# Patient Record
Sex: Female | Born: 1960 | Race: White | Hispanic: No | Smoking: Former smoker
Health system: Southern US, Community
[De-identification: ages and names within clinical notes are randomized; demographics above are authoritative.]

## PROBLEM LIST (undated history)

## (undated) DIAGNOSIS — T1491XA Suicide attempt, initial encounter: Secondary | ICD-10-CM

## (undated) DIAGNOSIS — I10 Essential (primary) hypertension: Secondary | ICD-10-CM

## (undated) DIAGNOSIS — E119 Type 2 diabetes mellitus without complications: Secondary | ICD-10-CM

## (undated) DIAGNOSIS — F419 Anxiety disorder, unspecified: Secondary | ICD-10-CM

## (undated) DIAGNOSIS — E669 Obesity, unspecified: Secondary | ICD-10-CM

## (undated) DIAGNOSIS — R1110 Vomiting, unspecified: Secondary | ICD-10-CM

## (undated) DIAGNOSIS — F1910 Other psychoactive substance abuse, uncomplicated: Secondary | ICD-10-CM

## (undated) DIAGNOSIS — F319 Bipolar disorder, unspecified: Secondary | ICD-10-CM

## (undated) DIAGNOSIS — E785 Hyperlipidemia, unspecified: Secondary | ICD-10-CM

## (undated) DIAGNOSIS — F32A Depression, unspecified: Secondary | ICD-10-CM

## (undated) DIAGNOSIS — F329 Major depressive disorder, single episode, unspecified: Secondary | ICD-10-CM

## (undated) HISTORY — DX: Hyperlipidemia, unspecified: E78.5

## (undated) HISTORY — DX: Anxiety disorder, unspecified: F41.9

## (undated) HISTORY — DX: Vomiting, unspecified: R11.10

## (undated) HISTORY — DX: Other psychoactive substance abuse, uncomplicated: F19.10

## (undated) HISTORY — DX: Type 2 diabetes mellitus without complications: E11.9

## (undated) HISTORY — PX: TONSILLECTOMY: SUR1361

## (undated) HISTORY — DX: Depression, unspecified: F32.A

## (undated) HISTORY — DX: Major depressive disorder, single episode, unspecified: F32.9

## (undated) HISTORY — DX: Bipolar disorder, unspecified: F31.9

## (undated) HISTORY — DX: Essential (primary) hypertension: I10

## (undated) HISTORY — DX: Obesity, unspecified: E66.9

---

## 1997-08-14 ENCOUNTER — Inpatient Hospital Stay (HOSPITAL_COMMUNITY): Admission: EM | Admit: 1997-08-14 | Discharge: 1997-08-15 | Payer: Self-pay | Admitting: Emergency Medicine

## 1997-08-22 ENCOUNTER — Emergency Department (HOSPITAL_COMMUNITY): Admission: EM | Admit: 1997-08-22 | Discharge: 1997-08-22 | Payer: Self-pay | Admitting: Emergency Medicine

## 1997-11-03 ENCOUNTER — Emergency Department (HOSPITAL_COMMUNITY): Admission: EM | Admit: 1997-11-03 | Discharge: 1997-11-03 | Payer: Self-pay | Admitting: Emergency Medicine

## 1997-11-20 ENCOUNTER — Other Ambulatory Visit: Admission: RE | Admit: 1997-11-20 | Discharge: 1997-11-20 | Payer: Self-pay | Admitting: Family Medicine

## 1999-01-21 ENCOUNTER — Other Ambulatory Visit: Admission: RE | Admit: 1999-01-21 | Discharge: 1999-01-21 | Payer: Self-pay | Admitting: Internal Medicine

## 1999-10-16 ENCOUNTER — Emergency Department (HOSPITAL_COMMUNITY): Admission: EM | Admit: 1999-10-16 | Discharge: 1999-10-16 | Payer: Self-pay | Admitting: *Deleted

## 2000-11-03 ENCOUNTER — Inpatient Hospital Stay (HOSPITAL_COMMUNITY): Admission: EM | Admit: 2000-11-03 | Discharge: 2000-11-10 | Payer: Self-pay | Admitting: Psychiatry

## 2000-11-12 ENCOUNTER — Inpatient Hospital Stay (HOSPITAL_COMMUNITY): Admission: EM | Admit: 2000-11-12 | Discharge: 2000-11-17 | Payer: Self-pay | Admitting: *Deleted

## 2001-07-28 ENCOUNTER — Emergency Department (HOSPITAL_COMMUNITY): Admission: EM | Admit: 2001-07-28 | Discharge: 2001-07-28 | Payer: Self-pay | Admitting: Emergency Medicine

## 2001-09-08 ENCOUNTER — Ambulatory Visit (HOSPITAL_COMMUNITY): Admission: RE | Admit: 2001-09-08 | Discharge: 2001-09-08 | Payer: Self-pay | Admitting: Internal Medicine

## 2001-09-08 ENCOUNTER — Encounter: Payer: Self-pay | Admitting: Internal Medicine

## 2001-12-25 ENCOUNTER — Emergency Department (HOSPITAL_COMMUNITY): Admission: EM | Admit: 2001-12-25 | Discharge: 2001-12-25 | Payer: Self-pay | Admitting: Emergency Medicine

## 2002-05-23 ENCOUNTER — Other Ambulatory Visit: Admission: RE | Admit: 2002-05-23 | Discharge: 2002-05-23 | Payer: Self-pay | Admitting: Internal Medicine

## 2003-01-03 ENCOUNTER — Encounter: Payer: Self-pay | Admitting: Internal Medicine

## 2003-01-03 ENCOUNTER — Ambulatory Visit (HOSPITAL_COMMUNITY): Admission: RE | Admit: 2003-01-03 | Discharge: 2003-01-03 | Payer: Self-pay | Admitting: Internal Medicine

## 2003-03-11 ENCOUNTER — Emergency Department (HOSPITAL_COMMUNITY): Admission: EM | Admit: 2003-03-11 | Discharge: 2003-03-11 | Payer: Self-pay | Admitting: Emergency Medicine

## 2003-12-26 ENCOUNTER — Ambulatory Visit: Payer: Self-pay | Admitting: Family Medicine

## 2003-12-28 ENCOUNTER — Ambulatory Visit: Payer: Self-pay | Admitting: Family Medicine

## 2004-01-23 ENCOUNTER — Ambulatory Visit: Payer: Self-pay | Admitting: *Deleted

## 2004-02-28 ENCOUNTER — Ambulatory Visit: Payer: Self-pay | Admitting: Family Medicine

## 2004-04-29 ENCOUNTER — Ambulatory Visit: Payer: Self-pay | Admitting: Family Medicine

## 2004-05-08 ENCOUNTER — Ambulatory Visit (HOSPITAL_COMMUNITY): Admission: RE | Admit: 2004-05-08 | Discharge: 2004-05-08 | Payer: Self-pay | Admitting: Internal Medicine

## 2004-05-28 ENCOUNTER — Ambulatory Visit: Payer: Self-pay | Admitting: Internal Medicine

## 2004-07-12 ENCOUNTER — Emergency Department (HOSPITAL_COMMUNITY): Admission: EM | Admit: 2004-07-12 | Discharge: 2004-07-12 | Payer: Self-pay | Admitting: Emergency Medicine

## 2004-08-06 ENCOUNTER — Ambulatory Visit: Payer: Self-pay | Admitting: Internal Medicine

## 2004-08-07 ENCOUNTER — Ambulatory Visit: Payer: Self-pay | Admitting: Internal Medicine

## 2004-10-02 ENCOUNTER — Ambulatory Visit: Payer: Self-pay | Admitting: Family Medicine

## 2005-01-15 ENCOUNTER — Ambulatory Visit: Payer: Self-pay | Admitting: Family Medicine

## 2005-01-28 ENCOUNTER — Ambulatory Visit: Payer: Self-pay | Admitting: Family Medicine

## 2005-04-09 ENCOUNTER — Ambulatory Visit: Payer: Self-pay | Admitting: Family Medicine

## 2005-04-21 ENCOUNTER — Ambulatory Visit: Payer: Self-pay | Admitting: Family Medicine

## 2005-05-11 ENCOUNTER — Ambulatory Visit: Payer: Self-pay | Admitting: Family Medicine

## 2005-07-27 ENCOUNTER — Ambulatory Visit: Payer: Self-pay | Admitting: Family Medicine

## 2005-08-13 ENCOUNTER — Ambulatory Visit: Payer: Self-pay | Admitting: Family Medicine

## 2005-09-14 ENCOUNTER — Ambulatory Visit (HOSPITAL_COMMUNITY): Admission: RE | Admit: 2005-09-14 | Discharge: 2005-09-14 | Payer: Self-pay | Admitting: Family Medicine

## 2005-10-19 ENCOUNTER — Ambulatory Visit: Payer: Self-pay | Admitting: Family Medicine

## 2005-11-23 ENCOUNTER — Ambulatory Visit: Payer: Self-pay | Admitting: Family Medicine

## 2006-01-19 ENCOUNTER — Emergency Department (HOSPITAL_COMMUNITY): Admission: EM | Admit: 2006-01-19 | Discharge: 2006-01-19 | Payer: Self-pay | Admitting: Emergency Medicine

## 2006-02-22 ENCOUNTER — Emergency Department (HOSPITAL_COMMUNITY): Admission: EM | Admit: 2006-02-22 | Discharge: 2006-02-22 | Payer: Self-pay | Admitting: Emergency Medicine

## 2006-04-20 ENCOUNTER — Ambulatory Visit: Payer: Self-pay | Admitting: Family Medicine

## 2006-07-05 ENCOUNTER — Emergency Department (HOSPITAL_COMMUNITY): Admission: EM | Admit: 2006-07-05 | Discharge: 2006-07-05 | Payer: Self-pay | Admitting: *Deleted

## 2006-08-18 ENCOUNTER — Ambulatory Visit: Payer: Self-pay | Admitting: Family Medicine

## 2006-08-20 ENCOUNTER — Ambulatory Visit (HOSPITAL_COMMUNITY): Admission: RE | Admit: 2006-08-20 | Discharge: 2006-08-20 | Payer: Self-pay | Admitting: Internal Medicine

## 2006-09-16 ENCOUNTER — Ambulatory Visit: Payer: Self-pay | Admitting: Family Medicine

## 2006-10-05 ENCOUNTER — Encounter (INDEPENDENT_AMBULATORY_CARE_PROVIDER_SITE_OTHER): Payer: Self-pay | Admitting: Family Medicine

## 2006-10-05 ENCOUNTER — Ambulatory Visit: Payer: Self-pay | Admitting: Internal Medicine

## 2006-10-14 ENCOUNTER — Ambulatory Visit: Payer: Self-pay | Admitting: Internal Medicine

## 2006-11-09 ENCOUNTER — Ambulatory Visit: Payer: Self-pay | Admitting: Internal Medicine

## 2006-11-11 ENCOUNTER — Ambulatory Visit (HOSPITAL_COMMUNITY): Admission: RE | Admit: 2006-11-11 | Discharge: 2006-11-11 | Payer: Self-pay | Admitting: Family Medicine

## 2006-11-25 ENCOUNTER — Encounter (INDEPENDENT_AMBULATORY_CARE_PROVIDER_SITE_OTHER): Payer: Self-pay | Admitting: Nurse Practitioner

## 2006-11-25 ENCOUNTER — Ambulatory Visit: Payer: Self-pay | Admitting: Internal Medicine

## 2006-11-25 LAB — CONVERTED CEMR LAB
Cholesterol: 174 mg/dL (ref 0–200)
Total CHOL/HDL Ratio: 3.1
VLDL: 29 mg/dL (ref 0–40)

## 2006-12-29 ENCOUNTER — Encounter (INDEPENDENT_AMBULATORY_CARE_PROVIDER_SITE_OTHER): Payer: Self-pay | Admitting: *Deleted

## 2007-03-13 ENCOUNTER — Emergency Department (HOSPITAL_COMMUNITY): Admission: EM | Admit: 2007-03-13 | Discharge: 2007-03-13 | Payer: Self-pay | Admitting: Emergency Medicine

## 2007-05-06 ENCOUNTER — Ambulatory Visit: Payer: Self-pay | Admitting: Internal Medicine

## 2007-05-06 LAB — CONVERTED CEMR LAB
Basophils Relative: 1 % (ref 0–1)
Eosinophils Absolute: 0.6 10*3/uL (ref 0.0–0.7)
Eosinophils Relative: 6 % — ABNORMAL HIGH (ref 0–5)
HCT: 41.2 % (ref 36.0–46.0)
Lymphocytes Relative: 25 % (ref 12–46)
MCV: 87.3 fL (ref 78.0–100.0)
Neutro Abs: 6.1 10*3/uL (ref 1.7–7.7)
Neutrophils Relative %: 61 % (ref 43–77)
Platelets: 345 10*3/uL (ref 150–400)
RDW: 13.5 % (ref 11.5–15.5)
Valproic Acid Lvl: 71 ug/mL (ref 50.0–100.0)

## 2007-07-07 ENCOUNTER — Ambulatory Visit: Payer: Self-pay | Admitting: Internal Medicine

## 2007-09-07 ENCOUNTER — Ambulatory Visit: Payer: Self-pay | Admitting: Internal Medicine

## 2007-11-07 ENCOUNTER — Encounter: Payer: Self-pay | Admitting: Family Medicine

## 2007-11-07 ENCOUNTER — Other Ambulatory Visit: Admission: RE | Admit: 2007-11-07 | Discharge: 2007-11-07 | Payer: Self-pay | Admitting: Family Medicine

## 2007-11-07 ENCOUNTER — Ambulatory Visit: Payer: Self-pay | Admitting: Internal Medicine

## 2007-11-07 LAB — CONVERTED CEMR LAB: Chlamydia, DNA Probe: NEGATIVE

## 2007-11-09 ENCOUNTER — Ambulatory Visit: Payer: Self-pay | Admitting: Internal Medicine

## 2007-11-09 ENCOUNTER — Encounter: Payer: Self-pay | Admitting: Family Medicine

## 2007-11-09 LAB — CONVERTED CEMR LAB
AST: 13 units/L (ref 0–37)
Albumin: 3.7 g/dL (ref 3.5–5.2)
BUN: 42 mg/dL — ABNORMAL HIGH (ref 6–23)
CO2: 20 meq/L (ref 19–32)
Chloride: 104 meq/L (ref 96–112)
Creatinine, Ser: 1.09 mg/dL (ref 0.40–1.20)
Glucose, Bld: 166 mg/dL — ABNORMAL HIGH (ref 70–99)
Helicobacter Pylori Antibody-IgG: 0.5
LDL Cholesterol: 107 mg/dL — ABNORMAL HIGH (ref 0–99)
TSH: 2.177 microintl units/mL (ref 0.350–4.50)
Total CHOL/HDL Ratio: 3.3
Total Protein: 6.7 g/dL (ref 6.0–8.3)
Triglycerides: 139 mg/dL (ref <150)
VLDL: 28 mg/dL (ref 0–40)

## 2007-12-28 ENCOUNTER — Ambulatory Visit (HOSPITAL_COMMUNITY): Admission: RE | Admit: 2007-12-28 | Discharge: 2007-12-28 | Payer: Self-pay | Admitting: Family Medicine

## 2008-01-11 ENCOUNTER — Ambulatory Visit: Payer: Self-pay | Admitting: Family Medicine

## 2008-01-11 LAB — CONVERTED CEMR LAB
Amphetamine Screen, Ur: NEGATIVE
Cocaine Metabolites: NEGATIVE
Creatinine,U: 152.3 mg/dL
Methadone: NEGATIVE
Opiate Screen, Urine: NEGATIVE
Propoxyphene: NEGATIVE

## 2008-02-28 ENCOUNTER — Ambulatory Visit: Payer: Self-pay | Admitting: Internal Medicine

## 2008-03-23 ENCOUNTER — Ambulatory Visit: Payer: Self-pay | Admitting: Family Medicine

## 2008-04-03 ENCOUNTER — Encounter: Admission: RE | Admit: 2008-04-03 | Discharge: 2008-07-02 | Payer: Self-pay | Admitting: Family Medicine

## 2008-04-04 ENCOUNTER — Encounter: Payer: Self-pay | Admitting: Family Medicine

## 2008-04-04 ENCOUNTER — Ambulatory Visit: Payer: Self-pay | Admitting: Internal Medicine

## 2008-04-04 LAB — CONVERTED CEMR LAB
AST: 22 units/L (ref 0–37)
Albumin: 3.8 g/dL (ref 3.5–5.2)
Alkaline Phosphatase: 84 units/L (ref 39–117)
Calcium: 9.3 mg/dL (ref 8.4–10.5)
Chloride: 101 meq/L (ref 96–112)
Cholesterol: 176 mg/dL (ref 0–200)
Creatinine, Ser: 1.24 mg/dL — ABNORMAL HIGH (ref 0.40–1.20)
HDL: 72 mg/dL (ref >39)
Potassium: 4.2 meq/L (ref 3.5–5.3)
Sodium: 139 meq/L (ref 135–145)
Total CHOL/HDL Ratio: 2.4
Triglycerides: 108 mg/dL (ref <150)

## 2008-06-01 ENCOUNTER — Ambulatory Visit: Payer: Self-pay | Admitting: Internal Medicine

## 2008-08-06 ENCOUNTER — Encounter: Admission: RE | Admit: 2008-08-06 | Discharge: 2008-11-04 | Payer: Self-pay | Admitting: Family Medicine

## 2008-08-08 ENCOUNTER — Ambulatory Visit: Payer: Self-pay | Admitting: Internal Medicine

## 2008-09-20 ENCOUNTER — Encounter: Payer: Self-pay | Admitting: Family Medicine

## 2008-09-20 ENCOUNTER — Ambulatory Visit: Payer: Self-pay | Admitting: Internal Medicine

## 2008-09-20 LAB — CONVERTED CEMR LAB
Albumin: 3.5 g/dL (ref 3.5–5.2)
BUN: 24 mg/dL — ABNORMAL HIGH (ref 6–23)
Basophils Absolute: 0.1 10*3/uL (ref 0.0–0.1)
CO2: 25 meq/L (ref 19–32)
Calcium: 9 mg/dL (ref 8.4–10.5)
Chloride: 101 meq/L (ref 96–112)
Glucose, Bld: 161 mg/dL — ABNORMAL HIGH (ref 70–99)
HCT: 41.4 % (ref 36.0–46.0)
HDL: 57 mg/dL (ref >39)
Hemoglobin: 12.6 g/dL (ref 12.0–15.0)
Microalb, Ur: 9.62 mg/dL — ABNORMAL HIGH (ref 0.00–1.89)
Monocytes Absolute: 0.6 10*3/uL (ref 0.1–1.0)
Monocytes Relative: 7 % (ref 3–12)
Neutrophils Relative %: 55 % (ref 43–77)
Platelets: 306 10*3/uL (ref 150–400)
RBC: 4.67 M/uL (ref 3.87–5.11)
RDW: 15.9 % — ABNORMAL HIGH (ref 11.5–15.5)
Total Bilirubin: 0.4 mg/dL (ref 0.3–1.2)
Total CHOL/HDL Ratio: 3.5
Total Protein: 6.5 g/dL (ref 6.0–8.3)
Triglycerides: 190 mg/dL — ABNORMAL HIGH (ref <150)
VLDL: 38 mg/dL (ref 0–40)
WBC: 7.8 10*3/uL (ref 4.0–10.5)

## 2008-09-28 ENCOUNTER — Ambulatory Visit: Payer: Self-pay | Admitting: Family Medicine

## 2008-09-28 LAB — CONVERTED CEMR LAB: Microalb, Ur: 8.57 mg/dL — ABNORMAL HIGH (ref 0.00–1.89)

## 2008-10-08 ENCOUNTER — Ambulatory Visit: Payer: Self-pay | Admitting: Internal Medicine

## 2008-12-12 ENCOUNTER — Encounter: Admission: RE | Admit: 2008-12-12 | Discharge: 2008-12-12 | Payer: Self-pay | Admitting: General Surgery

## 2008-12-28 ENCOUNTER — Ambulatory Visit (HOSPITAL_COMMUNITY): Admission: RE | Admit: 2008-12-28 | Discharge: 2008-12-28 | Payer: Self-pay | Admitting: Family Medicine

## 2008-12-28 ENCOUNTER — Ambulatory Visit (HOSPITAL_COMMUNITY): Admission: RE | Admit: 2008-12-28 | Discharge: 2008-12-28 | Payer: Self-pay | Admitting: General Surgery

## 2009-01-04 ENCOUNTER — Encounter: Payer: Self-pay | Admitting: Family Medicine

## 2009-01-04 ENCOUNTER — Ambulatory Visit: Payer: Self-pay | Admitting: Internal Medicine

## 2009-03-06 ENCOUNTER — Ambulatory Visit: Payer: Self-pay | Admitting: Family Medicine

## 2009-04-10 ENCOUNTER — Ambulatory Visit: Payer: Self-pay | Admitting: Family Medicine

## 2009-05-10 ENCOUNTER — Ambulatory Visit: Payer: Self-pay | Admitting: Internal Medicine

## 2009-05-10 ENCOUNTER — Encounter (INDEPENDENT_AMBULATORY_CARE_PROVIDER_SITE_OTHER): Payer: Self-pay | Admitting: Adult Health

## 2009-05-10 LAB — CONVERTED CEMR LAB
Albumin: 3.8 g/dL (ref 3.5–5.2)
CO2: 22 meq/L (ref 19–32)
Calcium: 9.3 mg/dL (ref 8.4–10.5)
Chloride: 100 meq/L (ref 96–112)
Cholesterol: 196 mg/dL (ref 0–200)
GC Probe Amp, Urine: NEGATIVE
Glucose, Bld: 205 mg/dL — ABNORMAL HIGH (ref 70–99)
HDL: 51 mg/dL (ref >39)
Hgb A1c MFr Bld: 7.3 % — ABNORMAL HIGH (ref 4.6–6.1)
Potassium: 4.4 meq/L (ref 3.5–5.3)
VLDL: 48 mg/dL — ABNORMAL HIGH (ref 0–40)
Vit D, 25-Hydroxy: 29 ng/mL — ABNORMAL LOW (ref 30–89)

## 2009-05-23 ENCOUNTER — Encounter: Admission: RE | Admit: 2009-05-23 | Discharge: 2009-06-24 | Payer: Self-pay | Admitting: General Surgery

## 2009-06-04 ENCOUNTER — Emergency Department (HOSPITAL_COMMUNITY): Admission: EM | Admit: 2009-06-04 | Discharge: 2009-06-04 | Payer: Self-pay | Admitting: Emergency Medicine

## 2009-06-11 ENCOUNTER — Inpatient Hospital Stay (HOSPITAL_COMMUNITY): Admission: RE | Admit: 2009-06-11 | Discharge: 2009-06-12 | Payer: Self-pay | Admitting: General Surgery

## 2009-06-11 HISTORY — PX: LAPAROSCOPIC GASTRIC BANDING: SHX1100

## 2009-06-28 ENCOUNTER — Ambulatory Visit: Payer: Self-pay | Admitting: Family Medicine

## 2009-07-08 ENCOUNTER — Other Ambulatory Visit: Payer: Self-pay | Admitting: Emergency Medicine

## 2009-07-09 ENCOUNTER — Ambulatory Visit: Payer: Self-pay | Admitting: Psychiatry

## 2009-07-09 ENCOUNTER — Other Ambulatory Visit: Payer: Self-pay | Admitting: Emergency Medicine

## 2009-07-09 ENCOUNTER — Inpatient Hospital Stay (HOSPITAL_COMMUNITY): Admission: AD | Admit: 2009-07-09 | Discharge: 2009-07-15 | Payer: Self-pay | Admitting: Psychiatry

## 2009-08-22 ENCOUNTER — Encounter: Admission: RE | Admit: 2009-08-22 | Discharge: 2009-10-10 | Payer: Self-pay | Admitting: General Surgery

## 2010-01-30 ENCOUNTER — Encounter: Admission: RE | Admit: 2010-01-30 | Discharge: 2010-01-30 | Payer: Self-pay | Admitting: Internal Medicine

## 2010-02-06 ENCOUNTER — Encounter
Admission: RE | Admit: 2010-02-06 | Discharge: 2010-02-06 | Payer: Self-pay | Source: Home / Self Care | Attending: General Surgery | Admitting: General Surgery

## 2010-05-03 ENCOUNTER — Encounter: Payer: Self-pay | Admitting: Internal Medicine

## 2010-05-08 ENCOUNTER — Encounter: Admit: 2010-05-08 | Payer: Self-pay | Admitting: General Surgery

## 2010-07-02 LAB — GLUCOSE, CAPILLARY
Glucose-Capillary: 106 mg/dL — ABNORMAL HIGH (ref 70–99)
Glucose-Capillary: 113 mg/dL — ABNORMAL HIGH (ref 70–99)
Glucose-Capillary: 127 mg/dL — ABNORMAL HIGH (ref 70–99)
Glucose-Capillary: 150 mg/dL — ABNORMAL HIGH (ref 70–99)
Glucose-Capillary: 188 mg/dL — ABNORMAL HIGH (ref 70–99)

## 2010-07-02 LAB — COMPREHENSIVE METABOLIC PANEL
AST: 21 U/L (ref 0–37)
Alkaline Phosphatase: 55 U/L (ref 39–117)
Calcium: 9.2 mg/dL (ref 8.4–10.5)
Chloride: 99 mEq/L (ref 96–112)
Creatinine, Ser: 1.16 mg/dL (ref 0.4–1.2)
GFR calc Af Amer: >60 mL/min (ref >60)
GFR calc non Af Amer: 50 mL/min — ABNORMAL LOW (ref >60)
Potassium: 4.1 mEq/L (ref 3.5–5.1)
Sodium: 136 mEq/L (ref 135–145)

## 2010-07-02 LAB — CBC
MCHC: 34 g/dL (ref 30.0–36.0)
MCV: 85.5 fL (ref 78.0–100.0)
Platelets: 277 10*3/uL (ref 150–400)
RDW: 14.9 % (ref 11.5–15.5)
WBC: 9.9 10*3/uL (ref 4.0–10.5)

## 2010-07-02 LAB — DIFFERENTIAL
Basophils Absolute: 0 10*3/uL (ref 0.0–0.1)
Basophils Relative: 0 % (ref 0–1)
Monocytes Absolute: 0.8 10*3/uL (ref 0.1–1.0)
Neutro Abs: 3.9 10*3/uL (ref 1.7–7.7)
Neutrophils Relative %: 39 % — ABNORMAL LOW (ref 43–77)

## 2010-07-06 LAB — COMPREHENSIVE METABOLIC PANEL
ALT: 25 U/L (ref 0–35)
AST: 26 U/L (ref 0–37)
Albumin: 3.4 g/dL — ABNORMAL LOW (ref 3.5–5.2)
Alkaline Phosphatase: 47 U/L (ref 39–117)
BUN: 31 mg/dL — ABNORMAL HIGH (ref 6–23)
Chloride: 100 mEq/L (ref 96–112)
GFR calc Af Amer: 55 mL/min — ABNORMAL LOW (ref >60)
Potassium: 4.3 mEq/L (ref 3.5–5.1)
Sodium: 136 mEq/L (ref 135–145)
Total Bilirubin: 0.3 mg/dL (ref 0.3–1.2)
Total Protein: 7 g/dL (ref 6.0–8.3)

## 2010-07-06 LAB — CBC
MCHC: 33.1 g/dL (ref 30.0–36.0)
MCV: 84.8 fL (ref 78.0–100.0)
Platelets: 190 10*3/uL (ref 150–400)
Platelets: 217 10*3/uL (ref 150–400)
RDW: 14.9 % (ref 11.5–15.5)
WBC: 8.4 10*3/uL (ref 4.0–10.5)
WBC: 8.1 10*3/uL (ref 4.0–10.5)

## 2010-07-06 LAB — GLUCOSE, CAPILLARY
Glucose-Capillary: 111 mg/dL — ABNORMAL HIGH (ref 70–99)
Glucose-Capillary: 148 mg/dL — ABNORMAL HIGH (ref 70–99)
Glucose-Capillary: 152 mg/dL — ABNORMAL HIGH (ref 70–99)
Glucose-Capillary: 154 mg/dL — ABNORMAL HIGH (ref 70–99)
Glucose-Capillary: 159 mg/dL — ABNORMAL HIGH (ref 70–99)
Glucose-Capillary: 173 mg/dL — ABNORMAL HIGH (ref 70–99)
Glucose-Capillary: 176 mg/dL — ABNORMAL HIGH (ref 70–99)

## 2010-07-06 LAB — DIFFERENTIAL
Basophils Relative: 1 % (ref 0–1)
Basophils Relative: 0 % (ref 0–1)
Eosinophils Absolute: 0.1 10*3/uL (ref 0.0–0.7)
Eosinophils Relative: 3 % (ref 0–5)
Monocytes Absolute: 0.7 10*3/uL (ref 0.1–1.0)
Monocytes Relative: 8 % (ref 3–12)
Neutro Abs: 4.3 10*3/uL (ref 1.7–7.7)
Neutrophils Relative %: 54 % (ref 43–77)

## 2010-07-06 LAB — RAPID URINE DRUG SCREEN, HOSP PERFORMED
Amphetamines: NOT DETECTED
Cocaine: NOT DETECTED
Opiates: NOT DETECTED
Tetrahydrocannabinol: POSITIVE — AB

## 2010-07-06 LAB — URINALYSIS, ROUTINE W REFLEX MICROSCOPIC
Bilirubin Urine: NEGATIVE
Hgb urine dipstick: NEGATIVE
Nitrite: NEGATIVE
Protein, ur: NEGATIVE mg/dL
Specific Gravity, Urine: 1.025 (ref 1.005–1.030)
Urobilinogen, UA: 1 mg/dL (ref 0.0–1.0)

## 2010-07-06 LAB — URINE CULTURE: Colony Count: 6000

## 2010-07-06 LAB — POCT PREGNANCY, URINE: Preg Test, Ur: NEGATIVE

## 2010-07-06 LAB — ETHANOL: Alcohol, Ethyl (B): <5 mg/dL (ref 0–10)

## 2010-07-06 LAB — URINE MICROSCOPIC-ADD ON

## 2010-07-07 LAB — GLUCOSE, CAPILLARY
Glucose-Capillary: 122 mg/dL — ABNORMAL HIGH (ref 70–99)
Glucose-Capillary: 126 mg/dL — ABNORMAL HIGH (ref 70–99)
Glucose-Capillary: 128 mg/dL — ABNORMAL HIGH (ref 70–99)
Glucose-Capillary: 139 mg/dL — ABNORMAL HIGH (ref 70–99)
Glucose-Capillary: 96 mg/dL (ref 70–99)

## 2010-07-07 LAB — RPR: RPR Ser Ql: NONREACTIVE

## 2010-07-07 LAB — VALPROIC ACID LEVEL: Valproic Acid Lvl: 64.7 ug/mL (ref 50.0–100.0)

## 2010-11-07 ENCOUNTER — Telehealth (INDEPENDENT_AMBULATORY_CARE_PROVIDER_SITE_OTHER): Payer: Self-pay | Admitting: General Surgery

## 2010-11-09 ENCOUNTER — Emergency Department (HOSPITAL_COMMUNITY)
Admission: EM | Admit: 2010-11-09 | Discharge: 2010-11-09 | Disposition: A | Discharge status: Home / Self Care | Payer: Medicare Other | Attending: Emergency Medicine | Admitting: Emergency Medicine

## 2010-11-09 ENCOUNTER — Emergency Department (HOSPITAL_COMMUNITY): Payer: Medicare Other

## 2010-11-09 DIAGNOSIS — R112 Nausea with vomiting, unspecified: Secondary | ICD-10-CM | POA: Insufficient documentation

## 2010-11-09 DIAGNOSIS — Z9884 Bariatric surgery status: Secondary | ICD-10-CM | POA: Insufficient documentation

## 2010-11-09 DIAGNOSIS — R10816 Epigastric abdominal tenderness: Secondary | ICD-10-CM | POA: Insufficient documentation

## 2010-11-09 DIAGNOSIS — F411 Generalized anxiety disorder: Secondary | ICD-10-CM | POA: Insufficient documentation

## 2010-11-09 DIAGNOSIS — I10 Essential (primary) hypertension: Secondary | ICD-10-CM | POA: Insufficient documentation

## 2010-11-09 DIAGNOSIS — R109 Unspecified abdominal pain: Secondary | ICD-10-CM | POA: Insufficient documentation

## 2010-11-09 DIAGNOSIS — E119 Type 2 diabetes mellitus without complications: Secondary | ICD-10-CM | POA: Insufficient documentation

## 2010-11-09 LAB — CBC
Hemoglobin: 14.6 g/dL (ref 12.0–15.0)
MCH: 28.9 pg (ref 26.0–34.0)
MCV: 83.4 fL (ref 78.0–100.0)
RBC: 5.05 MIL/uL (ref 3.87–5.11)

## 2010-11-09 LAB — URINALYSIS, ROUTINE W REFLEX MICROSCOPIC
Glucose, UA: NEGATIVE mg/dL
Hgb urine dipstick: NEGATIVE
pH: 7.5 (ref 5.0–8.0)

## 2010-11-09 LAB — COMPREHENSIVE METABOLIC PANEL
ALT: 14 U/L (ref 0–35)
Albumin: 3.7 g/dL (ref 3.5–5.2)
Alkaline Phosphatase: 70 U/L (ref 39–117)
BUN: 11 mg/dL (ref 6–23)
Potassium: 3.9 mEq/L (ref 3.5–5.1)
Sodium: 133 mEq/L — ABNORMAL LOW (ref 135–145)
Total Protein: 7.8 g/dL (ref 6.0–8.3)

## 2010-11-09 LAB — URINE MICROSCOPIC-ADD ON

## 2010-11-09 LAB — DIFFERENTIAL
Lymphs Abs: 2.6 10*3/uL (ref 0.7–4.0)
Monocytes Relative: 9 % (ref 3–12)
Neutro Abs: 6.9 10*3/uL (ref 1.7–7.7)
Neutrophils Relative %: 64 % (ref 43–77)

## 2010-11-09 LAB — LIPASE, BLOOD: Lipase: 18 U/L (ref 11–59)

## 2010-11-10 LAB — URINE CULTURE: Culture  Setup Time: 201207291757

## 2010-11-20 ENCOUNTER — Encounter (INDEPENDENT_AMBULATORY_CARE_PROVIDER_SITE_OTHER): Payer: Self-pay | Admitting: General Surgery

## 2010-11-21 ENCOUNTER — Ambulatory Visit (INDEPENDENT_AMBULATORY_CARE_PROVIDER_SITE_OTHER): Payer: Medicare Other | Admitting: General Surgery

## 2010-11-21 ENCOUNTER — Encounter (INDEPENDENT_AMBULATORY_CARE_PROVIDER_SITE_OTHER): Payer: Self-pay | Admitting: General Surgery

## 2010-11-21 DIAGNOSIS — I10 Essential (primary) hypertension: Secondary | ICD-10-CM

## 2010-11-21 DIAGNOSIS — F1020 Alcohol dependence, uncomplicated: Secondary | ICD-10-CM

## 2010-11-21 DIAGNOSIS — E119 Type 2 diabetes mellitus without complications: Secondary | ICD-10-CM

## 2010-11-21 NOTE — Progress Notes (Signed)
Patient returns for followup of lap band placed June 11, 2009. She was last seen in September of 2011 and he removed 1/2 cc due to over restriction. She has missed a few appointments since then. She was recently seen in the emergency room with acute abdominal pain after one night of binge drinking. She has not been drinking since. Her workup there was negative and her pain has resolved. She expressed she would like some fluid removed from the band. However after careful questioning it is clear that episodes of vomiting or due to eating the wrong foods or trying to eat too fast or too much. If she eats appropriate foods slowly in appropriate amounts she does not have problems.  Exam: General appears well. Abdomen is soft and nontender. Port site looks fine.  Assessment and plan: She has very good appropriate weight loss. Total weight loss is now 67 pounds from preop of 267 and a current of 200. I think she is appropriately restricted. We will get her back to see the dietitian. Review diet and exercise strategies. Return in 4 months.

## 2010-11-21 NOTE — Patient Instructions (Signed)
Keep appointment with dietitian. Exercise 4 times per week. He slowly in small bites.

## 2010-12-24 ENCOUNTER — Encounter: Payer: Self-pay | Admitting: *Deleted

## 2010-12-24 ENCOUNTER — Encounter: Payer: Medicare Other | Attending: General Surgery | Admitting: *Deleted

## 2010-12-24 DIAGNOSIS — Z09 Encounter for follow-up examination after completed treatment for conditions other than malignant neoplasm: Secondary | ICD-10-CM | POA: Insufficient documentation

## 2010-12-24 DIAGNOSIS — Z9884 Bariatric surgery status: Secondary | ICD-10-CM | POA: Insufficient documentation

## 2010-12-24 DIAGNOSIS — Z713 Dietary counseling and surveillance: Secondary | ICD-10-CM | POA: Insufficient documentation

## 2010-12-24 NOTE — Patient Instructions (Addendum)
Goals:  Follow Phase 3B: High Protein + Non-Starchy Vegetables  Eat 3-6 small meals/snacks, every 3-5 hrs  Increase lean protein foods to meet 60-80g goal  Increase fluid intake to 64oz +  Use 1/2 cup of carbohydrate (fruit, whole grain, starchy vegetable) with meals as needed  Avoid drinking 15 minutes before, during and 30 minutes after eating  Aim for >30 min of physical activity daily  Restart Lap-Band Supplementation

## 2010-12-24 NOTE — Progress Notes (Signed)
  Follow-up visit: 17 Months Post-Operative LAGB Surgery  Medical Nutrition Therapy:  Appt start time: 0800 end time:  0830.  Assessment:  Primary concerns today: post-operative bariatric surgery nutrition management. Lynn Palmer reports that she has been having some problems in the past year since her last visit. She had a relapse with alcohol several months ago yet has now reported that she is going back to her AA meetings. She has lost a significant amount of weight in the past year. She reports that her intake is really low in the AM and then tends to get "too hungry, eats too fast and overeats" in the evening where she occasionally has episodes of vomiting. She is here today to learn more about how to balance her day.  Weight today: 192.9 lbs Weight change: 42.6 lbs since last visit in 01/2010  Total weight lost: 76.5 lbs total BMI: 36.5% Weight goal: 150 lbs % Weight goal met: 70%  24-hr recall:  B (8 AM): Protein shake Snk (10  AM): Scrambled egg   L (12-2 PM): Bean and bacon soup OR Deli meat w/ cheese (3oz) Snk (PM): N/A  D (6-8 PM): Baked chicken, broccoli, mashed potato Snk (9 PM): string cheese or Protein shake  Fluid intake: water, protein shake = 60-100 oz Estimated total protein intake: 60-75 grams  Medications: See updated medication list. Supplementation: Not taking any supplements  CBG monitoring: Sporadic checking Average CBG per patient: <150 Last patient reported A1c: No recent  Lab Results  Component Value Date   HGBA1C 7.3* 05/10/2009   Using straws: No Drinking while eating: No Hair loss: No Carbonated beverages: No N/V/D/C: None reported Last Lap-Band fill: No recent band fill  Recent physical activity:  [facility] [employer]/Walking outside, 2-3 times/week for 30-45 minutes  Progress Towards Goal(s):  In progress.   Nutritional Diagnosis:  [place]-3.3 Overweight/obesity As related to recent LAGB surgery.  As evidenced by pt following LAGB dietary guidelines  for continued weight loss.    Intervention:  Nutrition education.  Monitoring/Evaluation:  Dietary intake, exercise, lap band fills, and body weight. Follow up in 3-6 months for 2 year post-op visit.

## 2011-01-19 ENCOUNTER — Other Ambulatory Visit (HOSPITAL_COMMUNITY): Payer: Self-pay | Admitting: Internal Medicine

## 2011-01-19 DIAGNOSIS — Z1231 Encounter for screening mammogram for malignant neoplasm of breast: Secondary | ICD-10-CM

## 2011-01-20 LAB — BASIC METABOLIC PANEL
BUN: 15
Creatinine, Ser: 1.3 — ABNORMAL HIGH
GFR calc non Af Amer: 44 — ABNORMAL LOW
Glucose, Bld: 138 — ABNORMAL HIGH
Potassium: 3.8

## 2011-01-20 LAB — URINALYSIS, ROUTINE W REFLEX MICROSCOPIC
Bilirubin Urine: NEGATIVE
Nitrite: NEGATIVE
Specific Gravity, Urine: 1.017
pH: 7

## 2011-01-20 LAB — CBC
MCHC: 34.3
MCV: 87
Platelets: 290

## 2011-01-20 LAB — RAPID URINE DRUG SCREEN, HOSP PERFORMED
Amphetamines: NOT DETECTED
Barbiturates: NOT DETECTED
Benzodiazepines: NOT DETECTED
Cocaine: NOT DETECTED
Opiates: NOT DETECTED
Tetrahydrocannabinol: NOT DETECTED

## 2011-01-20 LAB — URINE MICROSCOPIC-ADD ON

## 2011-01-20 LAB — DIFFERENTIAL
Basophils Relative: 1
Eosinophils Absolute: 1.5 — ABNORMAL HIGH
Monocytes Relative: 7
Neutrophils Relative %: 33 — ABNORMAL LOW

## 2011-02-03 ENCOUNTER — Ambulatory Visit (HOSPITAL_COMMUNITY): Admission: RE | Admit: 2011-02-03 | Payer: Medicare Other | Source: Ambulatory Visit

## 2011-02-17 ENCOUNTER — Other Ambulatory Visit: Payer: Self-pay | Admitting: Internal Medicine

## 2011-02-17 ENCOUNTER — Ambulatory Visit
Admission: RE | Admit: 2011-02-17 | Discharge: 2011-02-17 | Disposition: A | Discharge status: Home / Self Care | Payer: Medicare Other | Source: Ambulatory Visit | Attending: Internal Medicine | Admitting: Internal Medicine

## 2011-02-17 DIAGNOSIS — W19XXXA Unspecified fall, initial encounter: Secondary | ICD-10-CM

## 2011-02-17 DIAGNOSIS — R52 Pain, unspecified: Secondary | ICD-10-CM

## 2011-02-25 ENCOUNTER — Ambulatory Visit (HOSPITAL_COMMUNITY): Payer: Medicare Other

## 2011-03-04 ENCOUNTER — Encounter: Payer: Self-pay | Admitting: *Deleted

## 2011-03-04 ENCOUNTER — Encounter: Payer: Medicare Other | Attending: General Surgery | Admitting: *Deleted

## 2011-03-04 DIAGNOSIS — Z713 Dietary counseling and surveillance: Secondary | ICD-10-CM | POA: Insufficient documentation

## 2011-03-04 DIAGNOSIS — Z9884 Bariatric surgery status: Secondary | ICD-10-CM | POA: Insufficient documentation

## 2011-03-04 DIAGNOSIS — Z09 Encounter for follow-up examination after completed treatment for conditions other than malignant neoplasm: Secondary | ICD-10-CM | POA: Insufficient documentation

## 2011-03-04 NOTE — Progress Notes (Signed)
  Follow-up visit: 20 Months Post-Operative LAGB Surgery  Medical Nutrition Therapy:  Appt start time: 1219 end time:  1247.  Assessment:  Primary concerns today: post-operative bariatric surgery nutrition management. Aviya reports that she has chronic regurgitation due to, what she believes to be, "lap band being too tight". She notes that it has been this way for a while yet due to her recent relapse with alcohol and getting off of the diet, she decided to keep her band the same and wait until her status improves. She notes that she is improving slowly by getting involved and going to AA and OA meeting. She notes that she likes having more restriction in her band but feels a "tiny band fill removal" may make eating quality of life better.  Weight today: 181.5 lbs Weight change: 11.5 lbs Total weight lost: 87.9 lbs total BMI: 34.4% Weight goal: 150 lbs [facility] weight at [facility]: 269.4 lbs  24-hr recall:  B (AM): 1/2 cup oatmeal, 1 protein shake Snk (AM): n/a   L (PM): Yogurt cup Snk (3-4 PM): Protein shake  D (6-7 PM): Fish or Chicken (2-3 oz) OR 1 Hot Dog frank w/ chili w/ 1/2 cup applesauce Snk (PM): string cheese  Fluid intake: protein shakes, water = 50-60 oz Total protein intake: 30-40g  CBG's: Not checking Recent A1c = None recent; Pt still on Metformin  Lab Results  Component Value Date   HGBA1C 7.3* 05/10/2009    Medications: MD added 0.5 mg Klonipin Supplementation: Taking regularly  Using straws: No Drinking while eating: No Hair loss: No Carbonated beverages: No N/V/D/C: Frequent regurgitation related to not chewing well, dry food, etc Last Lap-Band fill: No recent reported  Recent physical activity:  [employer] Glider, Treadmill = 3 times/week, 30 minutes  Progress Towards Goal(s):  In progress.   Nutritional Diagnosis:  [place]-5.7.1 Inadequate protein intake As related to frequent regurgitation.  As evidenced by pt consuming ~50% of estimated protein needs.      Intervention:  Nutrition education.  Monitoring/Evaluation:  Dietary intake, exercise, lap band fills, and body weight. Follow up in 3-4 months for 23-24 month post-op visit.

## 2011-03-04 NOTE — Patient Instructions (Signed)
Goals:  Follow Phase 3A: Soft High Protein Phase  or Follow Pre-Op Diet  Continue on soft-protein rich foods to maintain intake  Eat 3-6 small meals/snacks, every 3-5 hrs  Increase lean protein foods to meet 60-85g goal  Continue fluid intake to 64oz +  Avoid drinking 15 minutes before, during and 30 minutes after eating  Continue with regular exercise  Follow up with Dr. Johna Sheriff on band fill

## 2011-03-16 NOTE — Telephone Encounter (Signed)
See comments in routing

## 2011-03-19 ENCOUNTER — Encounter (INDEPENDENT_AMBULATORY_CARE_PROVIDER_SITE_OTHER): Payer: Self-pay | Admitting: General Surgery

## 2011-03-19 ENCOUNTER — Ambulatory Visit (INDEPENDENT_AMBULATORY_CARE_PROVIDER_SITE_OTHER): Payer: Medicare Other | Admitting: General Surgery

## 2011-03-19 NOTE — Progress Notes (Signed)
History: Patient returns for followup of lap band placed March of 2011. She was last seen in August at which time she was having a fair amount of vomiting but it seemed that we could relate back to poor eating habits. She was also having a lot of psychological and stress issues at the time. She states that she really has been back on track and following the diet very carefully and exercising regularly. She's been taking her medications and is no longer drinking. She is tolerating her medications and food better but still has an episode of vomiting about every third day despite eating slowly. No nighttime regurgitation or reflux.  Exam: Weight is down an additional 18 pounds to 182.2 and BMI 34.4 which represents an 84 pound weight loss from preop. Her abdomen is soft and nontender. Port site looks fine.  Assessment and plan: She has done extremely well following her instructions in her weight loss is excellent but it does appear that she is slightly over restrictive. With this information we went ahead and removed 0.2 cc today. She will call this does not help and I will see her back in 4 months.

## 2011-03-27 ENCOUNTER — Ambulatory Visit (HOSPITAL_COMMUNITY): Payer: Medicare Other

## 2011-05-01 ENCOUNTER — Ambulatory Visit (HOSPITAL_COMMUNITY): Payer: Medicare Other | Attending: Internal Medicine

## 2011-05-08 ENCOUNTER — Ambulatory Visit (INDEPENDENT_AMBULATORY_CARE_PROVIDER_SITE_OTHER): Payer: Medicare Other | Admitting: Surgery

## 2011-05-08 ENCOUNTER — Encounter (INDEPENDENT_AMBULATORY_CARE_PROVIDER_SITE_OTHER): Payer: Self-pay | Admitting: Surgery

## 2011-05-08 ENCOUNTER — Telehealth (INDEPENDENT_AMBULATORY_CARE_PROVIDER_SITE_OTHER): Payer: Self-pay | Admitting: General Surgery

## 2011-05-08 ENCOUNTER — Ambulatory Visit
Admission: RE | Admit: 2011-05-08 | Discharge: 2011-05-08 | Disposition: A | Discharge status: Home / Self Care | Payer: Medicare Other | Source: Ambulatory Visit | Attending: Surgery | Admitting: Surgery

## 2011-05-08 VITALS — BP 126/68 | HR 70 | Temp 98.0°F | Resp 18 | Ht 61.0 in | Wt 185.4 lb

## 2011-05-08 DIAGNOSIS — Z9884 Bariatric surgery status: Secondary | ICD-10-CM

## 2011-05-08 NOTE — Progress Notes (Addendum)
[facility]  Ovidio Kin, MD,  FACS [address]  [address] [place], [place]    [address] Phone:  [phone] FAX:  [phone]   Re:   Lynn Palmer DOB:   [birth year] MRN:   [id number]  ASSESSMENT AND PLAN: 1.  Lap band, APS.  Placed 06/11/2009.  Last seen by Dr. Johna Sheriff - 03/19/2011 - he removed 0.2 cc  Last CXR - 11/09/2010 - band position okay.  She is actually up 3 pounds since Dr. Johna Sheriff saw her on 03/19/2011.  Today she admits to eating the wrong foods.  While in the office, I accessed her port and removed only 0.6 cc.  Worried about slipped band.  Will get KUB and have Dr. Johna Sheriff see her back in a couple of weeks to see if her symptoms improve. [KUB today - lap band looks in good position. I tried to call patient.  No answer.  DN  05/08/2011]  2.  Vomiting 5 times per day 3.  Bipolar.  On disability for her bipolar disease.  Her psych support is through the [employer] ([facility]).  She saw a Dr. Lennice Sites, but she has left.  HISTORY OF PRESENT ILLNESS: Chief Complaint  Patient presents with  . Lap Band Fill    Lynn Palmer is a 51 y.o. (DOB: July 10, 1960)  white female who is a patient of GARBA,LAWAL, MD, MD and comes to me today for vomiting, question whether lap band is too tight..  The patient weighed about 270 pounds when she had lap band placed in March 2011.  So she has had successful weight loss.  But over the last several months, she has had trouble with daily vomiting.  She saw Dr. Johna Sheriff in December, 2012, and he took a little fluid out.  Her vomiting improved briefly, but then got worse again.  It sounds like she does not mind vomiting, but she was made to come here by her boyfriend.  She said she is vomiting 5 to 6 times / day!  She said that she is under a lot of stress, because she has a 40 hour job (but receives very little money for working - she works at [employer])), has a new boyfriend, and has a new  grandchild (whose mother is having post partem depression).   PHYSICAL EXAM: BP 126/68  Pulse 70  Temp(Src) 98 F (36.7 C) (Temporal)  Resp 18  Ht 5\' 1"  (1.549 m)  Wt 185 lb 6.4 oz (84.097 kg)  BMI 35.03 kg/m2  Abdomen:  Soft.  No pain or tenderness or mass.  The port is in the RUQ.  Procedure:  I accessed her lap band, but only got 0.6 cc out.  I will leave all the fluid out of her band.  DATA REVIEWED: Old chart and x-rays.  Ovidio Kin, MD, FACS Office:  [phone]

## 2011-05-08 NOTE — Patient Instructions (Signed)
1.  To obtain an x-ray to check the position of the lap band.

## 2011-05-09 ENCOUNTER — Emergency Department (HOSPITAL_COMMUNITY)
Admission: EM | Admit: 2011-05-09 | Discharge: 2011-05-09 | Disposition: A | Discharge status: Home / Self Care | Payer: Medicare Other | Attending: Emergency Medicine | Admitting: Emergency Medicine

## 2011-05-09 ENCOUNTER — Encounter (HOSPITAL_COMMUNITY): Payer: Self-pay | Admitting: *Deleted

## 2011-05-09 DIAGNOSIS — I10 Essential (primary) hypertension: Secondary | ICD-10-CM | POA: Insufficient documentation

## 2011-05-09 DIAGNOSIS — T43591A Poisoning by other antipsychotics and neuroleptics, accidental (unintentional), initial encounter: Secondary | ICD-10-CM | POA: Insufficient documentation

## 2011-05-09 DIAGNOSIS — F329 Major depressive disorder, single episode, unspecified: Secondary | ICD-10-CM

## 2011-05-09 DIAGNOSIS — E669 Obesity, unspecified: Secondary | ICD-10-CM | POA: Insufficient documentation

## 2011-05-09 DIAGNOSIS — T424X1A Poisoning by benzodiazepines, accidental (unintentional), initial encounter: Secondary | ICD-10-CM

## 2011-05-09 DIAGNOSIS — F3130 Bipolar disorder, current episode depressed, mild or moderate severity, unspecified: Secondary | ICD-10-CM | POA: Insufficient documentation

## 2011-05-09 DIAGNOSIS — F1910 Other psychoactive substance abuse, uncomplicated: Secondary | ICD-10-CM | POA: Insufficient documentation

## 2011-05-09 DIAGNOSIS — Z9884 Bariatric surgery status: Secondary | ICD-10-CM | POA: Insufficient documentation

## 2011-05-09 DIAGNOSIS — E119 Type 2 diabetes mellitus without complications: Secondary | ICD-10-CM | POA: Insufficient documentation

## 2011-05-09 DIAGNOSIS — F411 Generalized anxiety disorder: Secondary | ICD-10-CM | POA: Insufficient documentation

## 2011-05-09 DIAGNOSIS — R4789 Other speech disturbances: Secondary | ICD-10-CM | POA: Insufficient documentation

## 2011-05-09 DIAGNOSIS — Z79899 Other long term (current) drug therapy: Secondary | ICD-10-CM | POA: Insufficient documentation

## 2011-05-09 DIAGNOSIS — T424X4A Poisoning by benzodiazepines, undetermined, initial encounter: Secondary | ICD-10-CM | POA: Insufficient documentation

## 2011-05-09 DIAGNOSIS — E785 Hyperlipidemia, unspecified: Secondary | ICD-10-CM | POA: Insufficient documentation

## 2011-05-09 LAB — CBC
Hemoglobin: 11.4 g/dL — ABNORMAL LOW (ref 12.0–15.0)
MCHC: 33.4 g/dL (ref 30.0–36.0)
Platelets: 226 10*3/uL (ref 150–400)
RDW: 13.6 % (ref 11.5–15.5)

## 2011-05-09 LAB — COMPREHENSIVE METABOLIC PANEL
ALT: 14 U/L (ref 0–35)
AST: 21 U/L (ref 0–37)
Albumin: 2.9 g/dL — ABNORMAL LOW (ref 3.5–5.2)
Alkaline Phosphatase: 42 U/L (ref 39–117)
Potassium: 4.5 mEq/L (ref 3.5–5.1)
Sodium: 133 mEq/L — ABNORMAL LOW (ref 135–145)
Total Protein: 5.9 g/dL — ABNORMAL LOW (ref 6.0–8.3)

## 2011-05-09 LAB — ACETAMINOPHEN LEVEL
Acetaminophen (Tylenol), Serum: <15 ug/mL (ref 10–30)
Acetaminophen (Tylenol), Serum: <15 ug/mL (ref 10–30)

## 2011-05-09 LAB — RAPID URINE DRUG SCREEN, HOSP PERFORMED
Amphetamines: NOT DETECTED
Barbiturates: NOT DETECTED
Benzodiazepines: NOT DETECTED
Cocaine: NOT DETECTED
Opiates: POSITIVE — AB
Tetrahydrocannabinol: POSITIVE — AB

## 2011-05-09 LAB — SALICYLATE LEVEL: Salicylate Lvl: <2 mg/dL — ABNORMAL LOW (ref 2.8–20.0)

## 2011-05-09 LAB — ETHANOL: Alcohol, Ethyl (B): <11 mg/dL (ref 0–11)

## 2011-05-09 MED ORDER — SODIUM CHLORIDE 0.9 % IV BOLUS (SEPSIS)
1000.0000 mL | Freq: Once | INTRAVENOUS | Status: AC
  Administered 2011-05-09: 1000 mL via INTRAVENOUS

## 2011-05-09 MED ORDER — LISINOPRIL-HYDROCHLOROTHIAZIDE 20-25 MG PO TABS: 1.0000 | ORAL_TABLET | Freq: Every day | ORAL | Status: DC

## 2011-05-09 MED ORDER — DIVALPROEX SODIUM 125 MG PO CPSP
1500.0000 mg | ORAL_CAPSULE | Freq: Two times a day (BID) | ORAL | Status: DC
Start: 2011-05-09 — End: 2011-05-09
  Filled 2011-05-09 (×2): qty 12

## 2011-05-09 MED ORDER — DIVALPROEX SODIUM 125 MG PO CPSP
750.0000 mg | ORAL_CAPSULE | Freq: Two times a day (BID) | ORAL | Status: DC
  Filled 2011-05-09 (×3): qty 6

## 2011-05-09 MED ORDER — ONDANSETRON HCL 4 MG PO TABS: 4.0000 mg | ORAL_TABLET | Freq: Three times a day (TID) | ORAL | Status: DC | PRN

## 2011-05-09 MED ORDER — HYDROCHLOROTHIAZIDE 25 MG PO TABS
25.0000 mg | ORAL_TABLET | Freq: Every day | ORAL | Status: DC
  Filled 2011-05-09 (×2): qty 1

## 2011-05-09 MED ORDER — ENSURE CLINICAL ST REVIGOR PO LIQD
237.0000 mL | Freq: Once | ORAL | Status: AC
Start: 2011-05-09 — End: 2011-05-09
  Administered 2011-05-09: 21:00:00 via ORAL
  Filled 2011-05-09: qty 237

## 2011-05-09 MED ORDER — ACETAMINOPHEN 325 MG PO TABS: 650.0000 mg | ORAL_TABLET | ORAL | Status: DC | PRN

## 2011-05-09 MED ORDER — LISINOPRIL 20 MG PO TABS
20.0000 mg | ORAL_TABLET | Freq: Every day | ORAL | Status: DC
  Filled 2011-05-09 (×2): qty 1

## 2011-05-09 MED ORDER — IBUPROFEN 600 MG PO TABS: 600.0000 mg | ORAL_TABLET | Freq: Three times a day (TID) | ORAL | Status: DC | PRN

## 2011-05-09 MED ORDER — NALOXONE HCL 0.4 MG/ML IJ SOLN
0.4000 mg | Freq: Once | INTRAMUSCULAR | Status: AC
  Administered 2011-05-09: 0.4 mg via INTRAVENOUS
  Filled 2011-05-09: qty 1

## 2011-05-09 NOTE — ED Notes (Addendum)
[name] PA, determined pt is able to d/c home but needs a cab voucher; unable to reach an on call SW; per ACT the [facility] can write a cab voucher; contacted [facility] Joe he said he'll call our Charge RN b/c there should be a SW on call to do the voucher. Pt asked for her personal phone so that she could try to call a cab driver that may come and get her for free from the hospital. Pt found someone to come pick her up at 2200. [name] and PA Hunt notified.

## 2011-05-09 NOTE — ED Notes (Signed)
Info faxed/phoned to [facility] for telepsych to be completed. Dr Trisha Mangle said he'll do it at about 1655. Pt still groggy, hard to keep awake to converse with at this time.

## 2011-05-09 NOTE — ED Provider Notes (Signed)
History     CSN: [id number]  Arrival date & time 05/09/11  1013   First MD Initiated Contact with Patient 05/09/11 1017      Chief Complaint  Patient presents with  . Ingestion    (Consider location/radiation/quality/duration/timing/severity/associated sxs/prior treatment) HPI  Patient is brought to ER by EMS with her boyfriend at bedside whom she lives with with chief complaint of overdose of clonazepam with the patient in the boyfriend stating that patient took approximately 14 tablets of 1 mg clonazepam at approximately 8:00 this morning with patient reporting that she and her boyfriend had an argument and she "thought he was going to leave me and so I took dementia medicine." Patient states she's not sure if she was "trying to hurt myself or just acting out." Patient has history of bipolar disorder which is managed at [facility]). Patient states that, clonazepam is the only medication that she took denying any alcohol use, other substance use or any other medication overuse this morning. Patient has history of lap band that is followed by Dr. Lendon Colonel worst and states she had to see Dr. Johna Sheriff yesterday in his office for ongoing nausea and vomiting with eating and had the fluid removed the lap band but states since then she ate and drank last night without difficulty and has not vomited since and has no complaints of abdominal pain nausea or vomiting. She denies dizziness, n/v, HA, but states "I feel a little off balance when I walk." She denies homicidal ideation or hallucination.  Past Medical History  Diagnosis Date  . Bipolar 1 disorder   . Obesity   . Diabetes mellitus   . Hyperlipidemia   . Hypertension   . Vomiting   . Depression   . Anxiety   . Substance abuse     alcohol     Past Surgical History  Procedure Date  . Tonsillectomy   . Laparoscopic gastric banding 06/11/09    History reviewed. No pertinent family history.  History  Substance Use Topics    . Smoking status: Former Games developer  . Smokeless tobacco: Never Used  . Alcohol Use: No    OB History    Grav Para Term Preterm Abortions TAB SAB Ect Mult Living                  Review of Systems  All other systems reviewed and are negative.    Allergies  Review of patient's allergies indicates no known allergies.  Home Medications   Current Outpatient Rx  Name Route Sig Dispense Refill  . BUSPIRONE HCL 15 MG PO TABS Oral Take 15 mg by mouth 2 (two) times daily.     Marland Kitchen CLONAZEPAM 0.5 MG PO TABS Oral Take 0.5 mg by mouth 2 (two) times daily as needed.      Marland Kitchen DIVALPROEX SODIUM 125 MG PO CPSP Oral Take 1,500 mg by mouth 2 (two) times daily.     Marland Kitchen LISINOPRIL-HYDROCHLOROTHIAZIDE 20-25 MG PO TABS Oral Take 1 tablet by mouth daily.      Marland Kitchen METFORMIN HCL 500 MG PO TABS  2 (two) times daily.     Marland Kitchen METOPROLOL TARTRATE 50 MG PO TABS  2 (two) times daily.     Marland Kitchen NORTREL 7/7/7 0.5/0.75/1-35 MG-MCG PO TABS  Daily.    . OXYCODONE HCL PO Oral Take by mouth as needed.      Marland Kitchen PRAVASTATIN SODIUM 20 MG PO TABS Oral Take 20 mg by mouth daily.      Marland Kitchen  SERTRALINE HCL 100 MG PO TABS Oral Take 150 mg by mouth 2 (two) times daily.     . TRAMADOL HCL 50 MG PO TABS Oral Take 50 mg by mouth every 6 (six) hours as needed.      Marland Kitchen ZOLPIDEM TARTRATE 10 MG PO TABS Oral Take 10 mg by mouth at bedtime as needed.        BP 109/50  Pulse 65  Temp(Src) 97.7 F (36.5 C) (Oral)  Resp 18  SpO2 98%  Physical Exam  Nursing note and vitals reviewed. Constitutional: She is oriented to person, place, and time. She appears well-developed and well-nourished. No distress.  HENT:  Head: Normocephalic and atraumatic.  Eyes: Conjunctivae and EOM are normal. Pupils are equal, round, and reactive to light.  Neck: Normal range of motion. Neck supple.  Cardiovascular: Normal rate, regular rhythm, normal heart sounds and intact distal pulses.  Exam reveals no gallop and no friction rub.   No murmur heard. Pulmonary/Chest:  Effort normal and breath sounds normal. No respiratory distress. She has no wheezes. She has no rales. She exhibits no tenderness.  Abdominal: Bowel sounds are normal. She exhibits no distension and no mass. There is no tenderness. There is no rebound and no guarding.  Musculoskeletal: Normal range of motion. She exhibits no edema and no tenderness.  Neurological: She is alert and oriented to person, place, and time.  Skin: Skin is warm and dry. No rash noted. She is not diaphoretic. No erythema.  Psychiatric: Her behavior is normal. Thought content normal. Her mood appears anxious. Her speech is slurred. Cognition and memory are normal.       Patient has a very mild slurring of speech but is mentating appropriately with normal thought content. She is anxious appearing.    ED Course  Procedures (including critical care time)  IV fluid bolus and monitoring VSS  12:05 PM Though patient did not list or admit to taking any other medications, her boyfriend states that patient has at home dilaudid and tylenol #3 and patient now stating that she "may have taken a tylenol #3."  Patient has been easily arousable from sleeping throughout ER stay and is currently sitting up in bed eating some lunch. She has no complaints. She continues to have good mentation. At this time we feel that she is medically cleared to be evaluated by behavioral health. Consult call pending.  3:01 PM Patient has been in the department for greater than 7 hours, mentating well. 4 hour acetaminophen is normal. She's been medically cleared and at team will evaluate patient. Temp cycling orders have been written.  Labs Reviewed  CBC - Abnormal; Notable for the following:    Hemoglobin 11.4 (*)    HCT 34.1 (*)    All other components within normal limits  COMPREHENSIVE METABOLIC PANEL - Abnormal; Notable for the following:    Sodium 133 (*)    Glucose, Bld 110 (*)    BUN 28 (*)    Creatinine, Ser 1.29 (*)    Total Protein  5.9 (*)    Albumin 2.9 (*)    Total Bilirubin 0.2 (*)    GFR calc non Af Amer 47 (*)    GFR calc Af Amer 55 (*)    All other components within normal limits  URINE RAPID DRUG SCREEN (HOSP PERFORMED) - Abnormal; Notable for the following:    Opiates POSITIVE (*)    Tetrahydrocannabinol POSITIVE (*)    All other components within normal limits  SALICYLATE LEVEL - Abnormal; Notable for the following:    Salicylate Lvl <2.0 (*)    All other components within normal limits  ETHANOL  ACETAMINOPHEN LEVEL  ACETAMINOPHEN LEVEL   Dg Abd Acute W/chest  05/08/2011  *RADIOLOGY REPORT*  Clinical Data: Lap band placement in March 2011, now vomiting  ACUTE ABDOMEN SERIES (ABDOMEN 2 VIEW & CHEST 1 VIEW)  Comparison: Chest pain acute abdomen of 11/09/2010  Findings: The lungs are clear.  Mediastinal contours appear normal. The heart is within normal limits in size.  No bony abnormality is seen.  The lap band is not appear to have changed in position, oriented in the 2 o'clock to 8 o'clock position.  The catheter tubing to the port in the right abdomen is unchanged and the port does appear to be rotated, but stable compared to the prior film.  Supine and erect views of the abdomen show no bowel obstruction.  No free air is noted.  There is a moderate amount of feces throughout the entire colon.  IMPRESSION:  1.  No active lung disease. 2.  No change in position of the lap band. 3.  Moderate amount of feces throughout the colon.  No obstruction or free air.  Original Report Authenticated By: Juline Patch, M.D.     1. Overdose of benzodiazepine   2. Depression   3. Substance abuse     Patient to have good mentation. She is ambulating from her psych ED bed to the bathroom without difficulty. She's able to sit up, move about the room and eat and drink without difficulty. Patient has had her telemetry psych consult. After consult the psychiatrist believes that the patient is stable to return home with patient  denying any suicidal or homicidal ideation rather than congesting the benzodiazepine was to "act out." Patient tells me that she has no desire to die or harm herself and feels safe returning home. Patient is agreeable with the advisement of the psychiatry consult to followup with her psychiatrist on Monday.  MDM  Patient is currently waiting on her ride to arrive at 10 PM. Dr. Ranae Palms in the oncoming PA will disposition patient when her ride arrives to the ER. Again patient denies suicidal or homicidal ideation. She has had a  psych consult with tele psych stating they feel she is appropriate for discharge home with close followup as an outpatient with her psychiatrist. Patient has been in the department for greater than 10 hours as been alert and oriented and mentating appropriately. She is ambulating without difficulty.        Jenness Corner, [place] 05/09/11 1928

## 2011-05-09 NOTE — ED Notes (Signed)
Pt  And belongings wanded by security. Belongings placed in pts room, locked in cabinet on pt side of bed.

## 2011-05-09 NOTE — ED Notes (Signed)
Per EMS: patient has been depressed lately and took 12-15 clonazepam this morning. No vomiting, no nausea. Pt ambulatory on scene with EMS. Family at bedside.

## 2011-05-09 NOTE — ED Notes (Signed)
Pt given meal

## 2011-05-09 NOTE — ED Notes (Signed)
Pt reports being Type II diabetic, HTN and a Lapband surgery pt. Pt reports she's tried to kill herself at least 20's before with the last time being about 15 yrs ago. She says every time something goes wrong with a man in her life she gets really depressed. Pt tearful at times. Pt  Admits to making self vomit d/t food being "stuck" and not going down otherwise she can't breathe. Oriented to the unit.

## 2011-05-09 NOTE — ED Notes (Signed)
Pt tearful when told she may d/c home tonight she says b/c she has no way home and if she takes the bus she has to walk through a bad neighborhood and she used to be on drugs also another reason she doesn't want to go through that area. Pt also said she was afraid to be at home alone but denies d/t +SI. She states she lives w/ her cousin who is currently in the hospital d/t breaking her hip yesterday. Pt admits to getting more depressed at night when she's alone but denies +SI if released. Pt says she's going to start going to AA again b/c last night she had 2 drinks. Pt says she's not afraid to take the bus home in the morning during the day. Pt doesn't want to sign herself into [facility] b/c she has a job. Pt seems more awake now than before during interaction. PA [name] in to see pt right now.
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
## 2011-05-09 NOTE — ED Notes (Signed)
Per [name], PA pt VS are to be taken [facility], we need to see who picks her up and call [phone] at that time though she is writing pt up for d/c at this time.
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
## 2011-05-09 NOTE — ED Notes (Signed)
3 pt belongings bags, 1 pocket book locked in locker

## 2011-05-09 NOTE — ED Notes (Addendum)
Telepsych report given to EDP and faxed to [facility]. EDP to re-evaluate after pt is fully awake. PA to order Ensure as pt requested. ACT updated.

## 2011-05-09 NOTE — ED Notes (Signed)
[id number]<AV> Expected date:05/09/11<BR> Expected time: 9:58 AM<BR> Means of arrival:Ambulance<BR> Comments:<BR> EMS OD

## 2011-05-10 NOTE — ED Provider Notes (Signed)
Medical screening examination/treatment/procedure(s) were conducted as a shared visit with non-physician practitioner(s) and myself.  I personally evaluated the patient during the encounter  This female presents following the ingestion of an excessive amount of benzodiazepines, initially describing a suicidal intent.  (The Patients ED course is well described in the note by the PA, above)  On arrival the patient was awake, with unremarkable vital signs.  Given her admission of overdose, toxicology labs were sent.  The patient's ingestion time was concerning for possible decompensation.  The patient did, indeed, but, mildly hypotensive in the hours after arrival.  Her initial evaluation was notable for evidence of narcotic use as well as her benzodiazepines.  Given these findings the patient required fluid resuscitation, in an attempt to reverse narcotic overdose was attempted as well.  The patient became somnolent for a short time, while being evaluated, but at no point lost consciousness.  The patient responded well to IV fluids, with appropriate changes in her blood pressure.  The half life of benzodiazepines is 4 days, but maximum onset is approximately 4 hours.  The patient was observed in the emergency department for greater than 12 hours, with a notable clinical improvement in her condition.  The patient's case was discussed with our psychiatry service.  Given the patient's appropriate resources, her denial of suicidal ideation on repeat evaluation, her recovery to normal vital signs, the absence of distress she was discharged to follow up as an outpatient. The patient's toxicology screen, did not indicate additional therapeutic interventions, though these were considered.  CRITICAL CARE Performed by: Gerhard Munch   Total critical care time: 35  Critical care time was exclusive of separately billable procedures and treating other patients.  Critical care was necessary to treat or prevent  imminent or life-threatening deterioration.  Critical care was time spent personally by me on the following activities: development of treatment plan with patient and/or surrogate as well as nursing, discussions with consultants, evaluation of patient's response to treatment, examination of patient, obtaining history from patient or surrogate, ordering and performing treatments and interventions, ordering and review of laboratory studies, ordering and review of radiographic studies, pulse oximetry and re-evaluation of patient's condition.   Gerhard Munch, MD 05/10/11 [phone]

## 2011-05-13 ENCOUNTER — Telehealth (INDEPENDENT_AMBULATORY_CARE_PROVIDER_SITE_OTHER): Payer: Self-pay

## 2011-05-13 NOTE — Telephone Encounter (Signed)
Spoke with Lynn Palmer to give follow up appointment date & time (05/22/11 @ 1:45 w/Dr. Johna Sheriff) she had several questions concerning her co-pay and balance, offered to transfer her to financial department for further assistance, she declined to speak with them at this time.

## 2011-05-22 ENCOUNTER — Encounter (INDEPENDENT_AMBULATORY_CARE_PROVIDER_SITE_OTHER): Payer: Medicare Other | Admitting: General Surgery

## 2011-06-13 ENCOUNTER — Encounter (HOSPITAL_COMMUNITY): Payer: Self-pay | Admitting: Family Medicine

## 2011-06-13 ENCOUNTER — Emergency Department (HOSPITAL_COMMUNITY)
Admission: EM | Admit: 2011-06-13 | Discharge: 2011-06-13 | Disposition: A | Discharge status: Other Acute Inpatient Hospital | Payer: Medicare Other | Attending: Emergency Medicine | Admitting: Emergency Medicine

## 2011-06-13 ENCOUNTER — Inpatient Hospital Stay (HOSPITAL_COMMUNITY)
Admission: AD | Admit: 2011-06-13 | Discharge: 2011-06-19 | DRG: 885 | Disposition: A | Discharge status: Home / Self Care | Payer: 59 | Attending: Psychiatry | Admitting: Psychiatry

## 2011-06-13 ENCOUNTER — Encounter (HOSPITAL_COMMUNITY): Payer: Self-pay | Admitting: *Deleted

## 2011-06-13 DIAGNOSIS — Z79899 Other long term (current) drug therapy: Secondary | ICD-10-CM

## 2011-06-13 DIAGNOSIS — M545 Low back pain, unspecified: Secondary | ICD-10-CM | POA: Diagnosis present

## 2011-06-13 DIAGNOSIS — E669 Obesity, unspecified: Secondary | ICD-10-CM

## 2011-06-13 DIAGNOSIS — F319 Bipolar disorder, unspecified: Secondary | ICD-10-CM | POA: Diagnosis present

## 2011-06-13 DIAGNOSIS — F411 Generalized anxiety disorder: Secondary | ICD-10-CM

## 2011-06-13 DIAGNOSIS — I10 Essential (primary) hypertension: Secondary | ICD-10-CM

## 2011-06-13 DIAGNOSIS — T50904A Poisoning by unspecified drugs, medicaments and biological substances, undetermined, initial encounter: Secondary | ICD-10-CM | POA: Insufficient documentation

## 2011-06-13 DIAGNOSIS — F1010 Alcohol abuse, uncomplicated: Secondary | ICD-10-CM

## 2011-06-13 DIAGNOSIS — F1210 Cannabis abuse, uncomplicated: Secondary | ICD-10-CM

## 2011-06-13 DIAGNOSIS — R45851 Suicidal ideations: Secondary | ICD-10-CM

## 2011-06-13 DIAGNOSIS — E119 Type 2 diabetes mellitus without complications: Secondary | ICD-10-CM | POA: Insufficient documentation

## 2011-06-13 DIAGNOSIS — Z9884 Bariatric surgery status: Secondary | ICD-10-CM

## 2011-06-13 DIAGNOSIS — F1910 Other psychoactive substance abuse, uncomplicated: Secondary | ICD-10-CM | POA: Diagnosis present

## 2011-06-13 DIAGNOSIS — Z87891 Personal history of nicotine dependence: Secondary | ICD-10-CM

## 2011-06-13 DIAGNOSIS — M2550 Pain in unspecified joint: Secondary | ICD-10-CM

## 2011-06-13 DIAGNOSIS — T50901A Poisoning by unspecified drugs, medicaments and biological substances, accidental (unintentional), initial encounter: Secondary | ICD-10-CM

## 2011-06-13 DIAGNOSIS — E785 Hyperlipidemia, unspecified: Secondary | ICD-10-CM | POA: Insufficient documentation

## 2011-06-13 DIAGNOSIS — F1020 Alcohol dependence, uncomplicated: Secondary | ICD-10-CM

## 2011-06-13 DIAGNOSIS — F309 Manic episode, unspecified: Principal | ICD-10-CM

## 2011-06-13 DIAGNOSIS — F1410 Cocaine abuse, uncomplicated: Secondary | ICD-10-CM

## 2011-06-13 DIAGNOSIS — F1310 Sedative, hypnotic or anxiolytic abuse, uncomplicated: Secondary | ICD-10-CM

## 2011-06-13 DIAGNOSIS — F1994 Other psychoactive substance use, unspecified with psychoactive substance-induced mood disorder: Secondary | ICD-10-CM

## 2011-06-13 LAB — COMPREHENSIVE METABOLIC PANEL
Alkaline Phosphatase: 47 U/L (ref 39–117)
BUN: 17 mg/dL (ref 6–23)
Calcium: 8.8 mg/dL (ref 8.4–10.5)
GFR calc Af Amer: 81 mL/min — ABNORMAL LOW (ref >90)
Glucose, Bld: 105 mg/dL — ABNORMAL HIGH (ref 70–99)
Potassium: 3.4 mEq/L — ABNORMAL LOW (ref 3.5–5.1)
Total Protein: 6.6 g/dL (ref 6.0–8.3)

## 2011-06-13 LAB — CBC
HCT: 35.4 % — ABNORMAL LOW (ref 36.0–46.0)
Hemoglobin: 12.1 g/dL (ref 12.0–15.0)
MCH: 29.1 pg (ref 26.0–34.0)
MCHC: 34.2 g/dL (ref 30.0–36.0)

## 2011-06-13 LAB — PREGNANCY, URINE: Preg Test, Ur: NEGATIVE

## 2011-06-13 LAB — GLUCOSE, CAPILLARY: Glucose-Capillary: 155 mg/dL — ABNORMAL HIGH (ref 70–99)

## 2011-06-13 LAB — RAPID URINE DRUG SCREEN, HOSP PERFORMED
Cocaine: POSITIVE — AB
Opiates: NOT DETECTED

## 2011-06-13 LAB — ACETAMINOPHEN LEVEL: Acetaminophen (Tylenol), Serum: <15 ug/mL (ref 10–30)

## 2011-06-13 MED ORDER — ALUM & MAG HYDROXIDE-SIMETH 200-200-20 MG/5ML PO SUSP: 30.0000 mL | ORAL | Status: DC | PRN

## 2011-06-13 MED ORDER — ACETAMINOPHEN 325 MG PO TABS: 650.0000 mg | ORAL_TABLET | Freq: Four times a day (QID) | ORAL | Status: DC | PRN

## 2011-06-13 MED ORDER — METFORMIN HCL 500 MG PO TABS
500.0000 mg | ORAL_TABLET | Freq: Two times a day (BID) | ORAL | Status: DC
  Administered 2011-06-13 (×2): 500 mg via ORAL
  Filled 2011-06-13 (×5): qty 1

## 2011-06-13 MED ORDER — DICLOFENAC SODIUM 1 % TD GEL
Freq: Every day | TRANSDERMAL | Status: DC
  Administered 2011-06-14 – 2011-06-18 (×4): via TOPICAL
  Filled 2011-06-13: qty 100

## 2011-06-13 MED ORDER — HYDROCHLOROTHIAZIDE 25 MG PO TABS
25.0000 mg | ORAL_TABLET | Freq: Every day | ORAL | Status: DC
  Administered 2011-06-13: 25 mg via ORAL
  Filled 2011-06-13 (×2): qty 1

## 2011-06-13 MED ORDER — ZOLPIDEM TARTRATE 10 MG PO TABS: 10.0000 mg | ORAL_TABLET | Freq: Every evening | ORAL | Status: DC | PRN

## 2011-06-13 MED ORDER — HYDROCHLOROTHIAZIDE 12.5 MG PO CAPS
25.0000 mg | ORAL_CAPSULE | Freq: Every day | ORAL | Status: DC
  Administered 2011-06-14 – 2011-06-15 (×2): 25 mg via ORAL
  Filled 2011-06-13 (×2): qty 2

## 2011-06-13 MED ORDER — ONDANSETRON 4 MG PO TBDP: 4.0000 mg | ORAL_TABLET | Freq: Four times a day (QID) | ORAL | Status: DC | PRN

## 2011-06-13 MED ORDER — CLONAZEPAM 0.5 MG PO TABS
0.5000 mg | ORAL_TABLET | Freq: Two times a day (BID) | ORAL | Status: DC | PRN
  Administered 2011-06-14: 0.5 mg via ORAL
  Filled 2011-06-13: qty 1

## 2011-06-13 MED ORDER — SERTRALINE HCL 50 MG PO TABS
100.0000 mg | ORAL_TABLET | Freq: Two times a day (BID) | ORAL | Status: DC
  Administered 2011-06-13: 100 mg via ORAL
  Filled 2011-06-13: qty 2

## 2011-06-13 MED ORDER — CHLORDIAZEPOXIDE HCL 25 MG PO CAPS
25.0000 mg | ORAL_CAPSULE | Freq: Four times a day (QID) | ORAL | Status: DC
  Administered 2011-06-13 – 2011-06-14 (×2): 25 mg via ORAL
  Filled 2011-06-13 (×3): qty 1

## 2011-06-13 MED ORDER — ENSURE IMMUNE HEALTH PO LIQD
237.0000 mL | Freq: Three times a day (TID) | ORAL | Status: DC
  Administered 2011-06-13: 237 mL via ORAL
  Filled 2011-06-13 (×7): qty 237

## 2011-06-13 MED ORDER — ZOLPIDEM TARTRATE 10 MG PO TABS
10.0000 mg | ORAL_TABLET | Freq: Every evening | ORAL | Status: DC | PRN
  Administered 2011-06-13 – 2011-06-14 (×2): 10 mg via ORAL
  Filled 2011-06-13 (×2): qty 1

## 2011-06-13 MED ORDER — SIMVASTATIN 20 MG PO TABS
20.0000 mg | ORAL_TABLET | Freq: Every day | ORAL | Status: DC
  Administered 2011-06-14 – 2011-06-17 (×4): 20 mg via ORAL
  Filled 2011-06-13 (×6): qty 1

## 2011-06-13 MED ORDER — ONDANSETRON HCL 4 MG PO TABS: 4.0000 mg | ORAL_TABLET | Freq: Three times a day (TID) | ORAL | Status: DC | PRN

## 2011-06-13 MED ORDER — OXYCODONE-ACETAMINOPHEN 5-325 MG PO TABS
1.0000 | ORAL_TABLET | ORAL | Status: DC | PRN
  Administered 2011-06-13 – 2011-06-15 (×5): 1 via ORAL
  Filled 2011-06-13 (×5): qty 1

## 2011-06-13 MED ORDER — METOPROLOL TARTRATE 25 MG PO TABS
25.0000 mg | ORAL_TABLET | Freq: Two times a day (BID) | ORAL | Status: DC
  Administered 2011-06-13: 25 mg via ORAL
  Filled 2011-06-13: qty 1

## 2011-06-13 MED ORDER — DIVALPROEX SODIUM 125 MG PO CPSP
1500.0000 mg | ORAL_CAPSULE | Freq: Two times a day (BID) | ORAL | Status: DC
  Administered 2011-06-13: 1500 mg via ORAL
  Filled 2011-06-13 (×4): qty 12

## 2011-06-13 MED ORDER — CHLORDIAZEPOXIDE HCL 25 MG PO CAPS
25.0000 mg | ORAL_CAPSULE | Freq: Four times a day (QID) | ORAL | Status: DC | PRN
  Administered 2011-06-14 – 2011-06-15 (×2): 25 mg via ORAL
  Filled 2011-06-13 (×2): qty 1

## 2011-06-13 MED ORDER — NICOTINE 21 MG/24HR TD PT24: 21.0000 mg | MEDICATED_PATCH | Freq: Every day | TRANSDERMAL | Status: DC

## 2011-06-13 MED ORDER — LOPERAMIDE HCL 2 MG PO CAPS: 2.0000 mg | ORAL_CAPSULE | ORAL | Status: DC | PRN

## 2011-06-13 MED ORDER — METFORMIN HCL 500 MG PO TABS
500.0000 mg | ORAL_TABLET | Freq: Two times a day (BID) | ORAL | Status: DC
  Administered 2011-06-14 – 2011-06-19 (×11): 500 mg via ORAL
  Filled 2011-06-13 (×14): qty 1

## 2011-06-13 MED ORDER — SIMVASTATIN 5 MG PO TABS
5.0000 mg | ORAL_TABLET | Freq: Every day | ORAL | Status: DC
  Administered 2011-06-13: 5 mg via ORAL
  Filled 2011-06-13 (×2): qty 1

## 2011-06-13 MED ORDER — BUSPIRONE HCL 10 MG PO TABS
15.0000 mg | ORAL_TABLET | Freq: Two times a day (BID) | ORAL | Status: DC
  Administered 2011-06-13: 15 mg via ORAL
  Filled 2011-06-13: qty 2

## 2011-06-13 MED ORDER — CHLORDIAZEPOXIDE HCL 25 MG PO CAPS: 25.0000 mg | ORAL_CAPSULE | Freq: Every day | ORAL | Status: DC

## 2011-06-13 MED ORDER — TRAMADOL HCL 50 MG PO TABS
100.0000 mg | ORAL_TABLET | Freq: Two times a day (BID) | ORAL | Status: DC | PRN
  Administered 2011-06-14 (×2): 100 mg via ORAL
  Filled 2011-06-13 (×2): qty 2

## 2011-06-13 MED ORDER — CHLORDIAZEPOXIDE HCL 25 MG PO CAPS: 25.0000 mg | ORAL_CAPSULE | Freq: Three times a day (TID) | ORAL | Status: DC

## 2011-06-13 MED ORDER — THIAMINE HCL 100 MG/ML IJ SOLN: 100.0000 mg | Freq: Once | INTRAMUSCULAR | Status: DC

## 2011-06-13 MED ORDER — TRAMADOL HCL 50 MG PO TABS
100.0000 mg | ORAL_TABLET | Freq: Two times a day (BID) | ORAL | Status: DC | PRN
  Administered 2011-06-13: 100 mg via ORAL
  Filled 2011-06-13: qty 2

## 2011-06-13 MED ORDER — BUSPIRONE HCL 10 MG PO TABS
15.0000 mg | ORAL_TABLET | Freq: Two times a day (BID) | ORAL | Status: DC
  Administered 2011-06-13 – 2011-06-19 (×12): 15 mg via ORAL
  Filled 2011-06-13: qty 3
  Filled 2011-06-13: qty 1
  Filled 2011-06-13: qty 3
  Filled 2011-06-13: qty 1
  Filled 2011-06-13: qty 3
  Filled 2011-06-13: qty 1
  Filled 2011-06-13: qty 3
  Filled 2011-06-13: qty 1
  Filled 2011-06-13 (×2): qty 42
  Filled 2011-06-13: qty 3
  Filled 2011-06-13: qty 1
  Filled 2011-06-13: qty 3
  Filled 2011-06-13: qty 1
  Filled 2011-06-13: qty 3
  Filled 2011-06-13 (×2): qty 1

## 2011-06-13 MED ORDER — LISINOPRIL 20 MG PO TABS
20.0000 mg | ORAL_TABLET | Freq: Every day | ORAL | Status: DC
  Administered 2011-06-13: 20 mg via ORAL
  Filled 2011-06-13 (×2): qty 1

## 2011-06-13 MED ORDER — IBUPROFEN 600 MG PO TABS: 600.0000 mg | ORAL_TABLET | Freq: Three times a day (TID) | ORAL | Status: DC | PRN

## 2011-06-13 MED ORDER — MAGNESIUM HYDROXIDE 400 MG/5ML PO SUSP
30.0000 mL | Freq: Every day | ORAL | Status: DC | PRN
  Administered 2011-06-18 – 2011-06-19 (×3): 30 mL via ORAL

## 2011-06-13 MED ORDER — CHLORDIAZEPOXIDE HCL 25 MG PO CAPS
50.0000 mg | ORAL_CAPSULE | Freq: Once | ORAL | Status: AC
  Administered 2011-06-13: 50 mg via ORAL
  Filled 2011-06-13 (×2): qty 1

## 2011-06-13 MED ORDER — VITAMIN B-1 100 MG PO TABS
100.0000 mg | ORAL_TABLET | Freq: Every day | ORAL | Status: DC
  Administered 2011-06-14 – 2011-06-15 (×2): 100 mg via ORAL
  Filled 2011-06-13 (×3): qty 1

## 2011-06-13 MED ORDER — LISINOPRIL 20 MG PO TABS
20.0000 mg | ORAL_TABLET | Freq: Every day | ORAL | Status: DC
  Administered 2011-06-14 – 2011-06-19 (×6): 20 mg via ORAL
  Filled 2011-06-13 (×7): qty 1

## 2011-06-13 MED ORDER — LISINOPRIL-HYDROCHLOROTHIAZIDE 20-25 MG PO TABS: 1.0000 | ORAL_TABLET | Freq: Every day | ORAL | Status: DC

## 2011-06-13 MED ORDER — HYDROXYZINE HCL 25 MG PO TABS
25.0000 mg | ORAL_TABLET | Freq: Four times a day (QID) | ORAL | Status: DC | PRN
  Administered 2011-06-14 – 2011-06-15 (×2): 25 mg via ORAL
  Filled 2011-06-13: qty 1

## 2011-06-13 MED ORDER — CHLORDIAZEPOXIDE HCL 25 MG PO CAPS: 25.0000 mg | ORAL_CAPSULE | ORAL | Status: DC

## 2011-06-13 MED ORDER — LORAZEPAM 1 MG PO TABS
1.0000 mg | ORAL_TABLET | Freq: Three times a day (TID) | ORAL | Status: DC | PRN
  Administered 2011-06-13: 1 mg via ORAL
  Filled 2011-06-13: qty 1

## 2011-06-13 MED ORDER — ACETAMINOPHEN 325 MG PO TABS
650.0000 mg | ORAL_TABLET | ORAL | Status: DC | PRN
  Administered 2011-06-13 (×2): 650 mg via ORAL
  Filled 2011-06-13 (×2): qty 2

## 2011-06-13 MED ORDER — ADULT MULTIVITAMIN W/MINERALS CH
1.0000 | ORAL_TABLET | Freq: Every day | ORAL | Status: DC
  Administered 2011-06-14 – 2011-06-19 (×6): 1 via ORAL
  Filled 2011-06-13 (×7): qty 1
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
## 2011-06-13 NOTE — ED Notes (Signed)
Patient assisted to bathroom. Urine specimen collected and patient changed into blue scrubs.

## 2011-06-13 NOTE — Tx Team (Signed)
Initial Interdisciplinary Treatment Plan  PATIENT STRENGTHS: (choose at least two) Average or above average intelligence Capable of independent living General fund of knowledge  PATIENT STRESSORS: Health problems Marital or family conflict Substance abuse   PROBLEM LIST: Problem List/Patient Goals Date to be addressed Date deferred Reason deferred Estimated date of resolution  Depression 06/13/11     Suicidal ideation 06/13/11     Substance abuse 06/13/11                                          DISCHARGE CRITERIA:  Improved stabilization in mood, thinking, and/or behavior Motivation to continue treatment in a less acute level of care Need for constant or close observation no longer present Reduction of life-threatening or endangering symptoms to within safe limits Verbal commitment to aftercare and medication compliance Withdrawal symptoms are absent or subacute and managed without 24-hour nursing intervention  PRELIMINARY DISCHARGE PLAN: Attend 12-step recovery group Return to previous living arrangement  PATIENT/FAMIILY INVOLVEMENT: This treatment plan has been presented to and reviewed with the patient, Lynn Palmer,  patient and family have been given the opportunity to ask questions and make suggestions.  Juliann Pares 06/13/2011, 8:53 PM
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
## 2011-06-13 NOTE — ED Notes (Signed)
Pt up to the phone and was overheard by this writer telling person that she was "...going to sell my house and kill myself."

## 2011-06-13 NOTE — ED Notes (Signed)
Patient states "I was seen here a week ago and I lied to the doctor so I could get out and kill myself. I have been planing this for a week.

## 2011-06-13 NOTE — ED Notes (Addendum)
[id number] date:<BR>Expected time:<BR>Means of arrival:<BR>Comments:<BR>EMS: drug overdose

## 2011-06-13 NOTE — ED Notes (Signed)
Report called to Luann at [facility], pt accepted to 300-2 but cannot transport until 1835.
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
## 2011-06-13 NOTE — ED Notes (Signed)
Pt ambulated to room 35 with sitter and ED tech.  All belongings accompanied pt.
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
## 2011-06-13 NOTE — ED Notes (Signed)
Patient tearful. States "I'm just going to do it again." When asked for clarification, she states "I am just going to try to kill myself again."

## 2011-06-13 NOTE — ED Notes (Signed)
Pt up to desk requesting to use the phone, she's reached her limit allowed. Pt's lunch ordered for [employer] modified and soft foods.

## 2011-06-13 NOTE — ED Notes (Signed)
Patient and belongings wanded by security. Meds counted and inventoried by Erick Blinks. Meds to pharmacy for lock-up.
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
## 2011-06-13 NOTE — ED Notes (Signed)
[facility] notified to come and transport pt to [facility].

## 2011-06-13 NOTE — ED Notes (Signed)
Pt sponsor had just come to visit w/ her about 5-10 minutes prior to [employer] arriving to transport. Pt transferred to [facility]. 2 personal belongings bags sent with them to [facility]. Pt's mom took her items home that were locked up in the security dept.

## 2011-06-13 NOTE — ED Notes (Addendum)
Patient noted to be in hallway attempting to ambulate to the bathroom; gait unsteady. Informed patient that she needed to call for assistance with ambulation. Patient states "I don't care if I fall." Assisted patient to bathroom. Wheelchair parked outside of bathroom to facilitate transportation back to ED stretcher. On return from bathroom patient states "Lynn Palmer are going to have to keep me here for a long time because I am just going to keep trying to kill myself."

## 2011-06-14 DIAGNOSIS — F1994 Other psychoactive substance use, unspecified with psychoactive substance-induced mood disorder: Secondary | ICD-10-CM

## 2011-06-14 DIAGNOSIS — F1910 Other psychoactive substance abuse, uncomplicated: Secondary | ICD-10-CM | POA: Diagnosis present

## 2011-06-14 LAB — GLUCOSE, CAPILLARY
Glucose-Capillary: 138 mg/dL — ABNORMAL HIGH (ref 70–99)
Glucose-Capillary: 114 mg/dL — ABNORMAL HIGH (ref 70–99)

## 2011-06-14 MED ORDER — VALPROIC ACID 250 MG/5ML PO SYRP
500.0000 mg | ORAL_SOLUTION | Freq: Three times a day (TID) | ORAL | Status: DC
  Administered 2011-06-14 – 2011-06-19 (×16): 500 mg via ORAL
  Filled 2011-06-14 (×21): qty 10

## 2011-06-14 MED ORDER — ACETAMINOPHEN 160 MG/5ML PO SOLN
650.0000 mg | Freq: Four times a day (QID) | ORAL | Status: DC | PRN
  Filled 2011-06-14: qty 20.3

## 2011-06-14 NOTE — Progress Notes (Signed)
Patient ID: Lynn Palmer, female   DOB: 1961-02-07, 52 y.o.   MRN: [id number]  [facility] Group Notes:  (Counselor/Nursing/MHT/Case Management/Adjunct)  06/14/2011 1:15 PM  Type of Therapy:  Group Therapy, Dance/Movement Therapy   Participation Level:  Active  Participation Quality:  Appropriate  Affect:  Appropriate  Cognitive:  Appropriate  Insight:  Good  Engagement in Group:  Good  Engagement in Therapy:  Good  Modes of Intervention:  Clarification, Problem-solving, Role-play, Socialization and Support  Summary of Progress/Problems:  Therapist discussed the meaning of healthy supports.  Therapist encouraged members to share their definition of a healthy support and what it means to them.  Pt. stated that "my family is supportive and attending support groups".  Pt. Stated " that it is important to feel valued and have meaning in life".    Rhunette Croft
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
## 2011-06-14 NOTE — Progress Notes (Signed)
Patient attended group and has been up and active on the unit this evening. Patient c/o arthritis pain and was given Percocet. Patient voiced no other complaints and was informed of her scheduled medication which was also given at this time. Patient denies si/hi/a/v hall. Safety maintained, will continue to monitor.

## 2011-06-14 NOTE — H&P (Signed)
  Pt was seen by me today and I agree with the key elements documented in H&P.  
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
## 2011-06-14 NOTE — Progress Notes (Signed)
Patient ID: Lynn Palmer, female   DOB: [birth year], 51 y.o.   MRN: [id number] Pt. attended and participated in aftercare planning group. Pt. accepted information on suicide prevention, warning signs to look for with suicide and crisis line numbers to use. The pt. agreed to call crisis line numbers if having warning signs or having thoughts of suicide. Pt. listed their current anxiety level as a10 on scale of 1-10 with 10 as the highest and depression level 10. Pt. was very emotional and crying because she wants the doctor to check on changing her medications.
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
## 2011-06-14 NOTE — Progress Notes (Signed)
[facility] Adult Inpatient Family/Significant Other Suicide Prevention Education  Suicide Prevention Education:  Education Completed; Lynn Palmer-(Mother)- [phone]) or [phone] (home)has been identified by the patient as the family member/significant other with whom the patient will be residing, and identified as the person(s) who will aid the patient in the event of a mental health crisis (suicidal ideations/suicide attempt).  With written consent from the patient, the family member/significant other has been provided the following suicide prevention education, prior to the and/or following the discharge of the patient.  The suicide prevention education provided includes the following:  Suicide risk factors  Suicide prevention and interventions  National Suicide Hotline telephone number  [facility] assessment telephone number  [facility] Emergency Assistance 911  [facility] and/or Residential Mobile Crisis Unit telephone number  Request made of family/significant other to:  Remove weapons (e.g., guns, rifles, knives), all items previously/currently identified as safety concern. Pt.'s mother reports she has no weapons or guns and will secure pt.'s home and make sure it is safe before the pt is d/c.   Remove drugs/medications (over-the-counter, prescriptions, illicit drugs), all items previously/currently identified as a safety concern.Pt.'s mother will secure the home and lock up and monitor the pt.'s medication.  Pt.'s mother states that pt. Had a a extensive history of SI attempts. Pt.is mother is supportive and wants the pt. To get the help she needs. Pt.'s mother discussed pt.'s LAP Band surgery she had recently. Pt.'s mother can be reached at the number above.  The family member/significant other verbalizes understanding of the suicide prevention education information provided.  The family member/significant other agrees to remove the items of safety  concern listed above.  Lamar Blinks [place] 06/14/2011, 5:05 PM

## 2011-06-14 NOTE — Progress Notes (Signed)
[facility] Adult Inpatient Family/Significant Other Suicide Prevention Education  Suicide Prevention Education:  Contact Attempts: Ava Herrington-[phone](mother) has been identified by the patient as the family member/significant other with whom the patient will be residing, and identified as the person(s) who will aid the patient in the event of a mental health crisis.  With written consent from the patient, two attempts were made to provide suicide prevention education, prior to and/or following the patient's discharge.  We were unsuccessful in providing suicide prevention education.  A suicide education pamphlet was given to the patient to share with family/significant other.  Date and time of first attempt: by Franciso Bend on 06/14/11 at 4:54 p.m. Date and time of second attempt:  Neila Gear 06/14/2011, 4:53 PM

## 2011-06-14 NOTE — BHH Suicide Risk Assessment (Signed)
Suicide Risk Assessment  Admission Assessment     Demographic factors:  Assessment Details Time of Assessment: Admission Information Obtained From: Patient Current Mental Status:  Current Mental Status: Suicidal ideation indicated by patient;Suicide plan;Intention to act on suicide plan;Belief that plan would result in death Loss Factors:  Loss Factors: Decline in physical health;Financial problems / change in socioeconomic status Historical Factors:  Historical Factors: Prior suicide attempts (close to 20) Risk Reduction Factors:  Risk Reduction Factors: Living with another person, especially a relative;Positive social support  CLINICAL FACTORS:   Alcohol/Substance Abuse/Dependencies  COGNITIVE FEATURES THAT CONTRIBUTE TO RISK:  Closed-mindedness    SUICIDE RISK:   Severe:  Frequent, intense, and enduring suicidal ideation, specific plan, no subjective intent, but some objective markers of intent (i.e., choice of lethal method), the method is accessible, some limited preparatory behavior, evidence of impaired self-control, severe dysphoria/symptomatology, multiple risk factors present, and few if any protective factors, particularly a lack of social support.  PLAN OF CARE:  Admit for safety and stabilization  Detox as needed  Adjust meds to liquid form to help with compliance is s/p lap banding   Wonda Cerise 06/14/2011, 10:41 PM
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
## 2011-06-15 DIAGNOSIS — F1020 Alcohol dependence, uncomplicated: Secondary | ICD-10-CM

## 2011-06-15 DIAGNOSIS — E119 Type 2 diabetes mellitus without complications: Secondary | ICD-10-CM

## 2011-06-15 DIAGNOSIS — F1910 Other psychoactive substance abuse, uncomplicated: Secondary | ICD-10-CM

## 2011-06-15 DIAGNOSIS — Z9884 Bariatric surgery status: Secondary | ICD-10-CM

## 2011-06-15 DIAGNOSIS — I10 Essential (primary) hypertension: Secondary | ICD-10-CM

## 2011-06-15 LAB — GLUCOSE, CAPILLARY
Glucose-Capillary: 94 mg/dL (ref 70–99)
Glucose-Capillary: 138 mg/dL — ABNORMAL HIGH (ref 70–99)

## 2011-06-15 MED ORDER — CHLORDIAZEPOXIDE HCL 25 MG PO CAPS
25.0000 mg | ORAL_CAPSULE | Freq: Three times a day (TID) | ORAL | Status: AC
  Administered 2011-06-17 (×3): 25 mg via ORAL
  Filled 2011-06-15 (×3): qty 1

## 2011-06-15 MED ORDER — CHLORDIAZEPOXIDE HCL 25 MG PO CAPS
25.0000 mg | ORAL_CAPSULE | Freq: Four times a day (QID) | ORAL | Status: AC
  Administered 2011-06-15 – 2011-06-16 (×6): 25 mg via ORAL
  Filled 2011-06-15 (×6): qty 1

## 2011-06-15 MED ORDER — INSULIN ASPART 100 UNIT/ML ~~LOC~~ SOLN
0.0000 [IU] | Freq: Three times a day (TID) | SUBCUTANEOUS | Status: DC
  Filled 2011-06-15: qty 3

## 2011-06-15 MED ORDER — THIAMINE HCL 100 MG/ML IJ SOLN
100.0000 mg | Freq: Once | INTRAMUSCULAR | Status: AC
  Administered 2011-06-15: 100 mg via INTRAMUSCULAR

## 2011-06-15 MED ORDER — QUETIAPINE FUMARATE 25 MG PO TABS
25.0000 mg | ORAL_TABLET | Freq: Two times a day (BID) | ORAL | Status: DC
  Administered 2011-06-15 – 2011-06-18 (×6): 25 mg via ORAL
  Filled 2011-06-15 (×7): qty 1

## 2011-06-15 MED ORDER — CHLORDIAZEPOXIDE HCL 25 MG PO CAPS
25.0000 mg | ORAL_CAPSULE | ORAL | Status: AC
  Administered 2011-06-18 (×2): 25 mg via ORAL
  Filled 2011-06-15: qty 1

## 2011-06-15 MED ORDER — VITAMIN B-1 100 MG PO TABS
100.0000 mg | ORAL_TABLET | Freq: Every day | ORAL | Status: DC
  Administered 2011-06-16 – 2011-06-19 (×4): 100 mg via ORAL
  Filled 2011-06-15 (×6): qty 1

## 2011-06-15 MED ORDER — CHLORDIAZEPOXIDE HCL 25 MG PO CAPS
25.0000 mg | ORAL_CAPSULE | Freq: Every day | ORAL | Status: AC
  Administered 2011-06-19: 25 mg via ORAL
  Filled 2011-06-15 (×2): qty 1

## 2011-06-15 MED ORDER — CHLORDIAZEPOXIDE HCL 25 MG PO CAPS
50.0000 mg | ORAL_CAPSULE | Freq: Once | ORAL | Status: AC
  Administered 2011-06-15: 50 mg via ORAL
  Filled 2011-06-15: qty 1

## 2011-06-15 MED ORDER — ADULT MULTIVITAMIN W/MINERALS CH: 1.0000 | ORAL_TABLET | Freq: Every day | ORAL | Status: DC

## 2011-06-15 MED ORDER — QUETIAPINE FUMARATE 100 MG PO TABS
100.0000 mg | ORAL_TABLET | Freq: Every day | ORAL | Status: DC
  Administered 2011-06-15 – 2011-06-17 (×3): 100 mg via ORAL
  Filled 2011-06-15 (×4): qty 1

## 2011-06-15 MED ORDER — CHLORDIAZEPOXIDE HCL 25 MG PO CAPS: 25.0000 mg | ORAL_CAPSULE | Freq: Four times a day (QID) | ORAL | Status: DC | PRN

## 2011-06-15 MED ORDER — ONDANSETRON 4 MG PO TBDP: 4.0000 mg | ORAL_TABLET | Freq: Four times a day (QID) | ORAL | Status: AC | PRN

## 2011-06-15 MED ORDER — QUETIAPINE FUMARATE 25 MG PO TABS
25.0000 mg | ORAL_TABLET | Freq: Two times a day (BID) | ORAL | Status: DC
  Filled 2011-06-15 (×4): qty 1

## 2011-06-15 MED ORDER — LORAZEPAM 1 MG PO TABS
2.0000 mg | ORAL_TABLET | Freq: Once | ORAL | Status: AC
  Administered 2011-06-15: 2 mg via ORAL

## 2011-06-15 MED ORDER — LOPERAMIDE HCL 2 MG PO CAPS: 2.0000 mg | ORAL_CAPSULE | ORAL | Status: AC | PRN

## 2011-06-15 MED ORDER — LORAZEPAM 1 MG PO TABS
ORAL_TABLET | ORAL | Status: AC
  Administered 2011-06-15: 2 mg via ORAL
  Filled 2011-06-15: qty 2

## 2011-06-15 MED ORDER — ACETAMINOPHEN 325 MG PO TABS
650.0000 mg | ORAL_TABLET | Freq: Four times a day (QID) | ORAL | Status: DC | PRN
  Administered 2011-06-15 – 2011-06-19 (×9): 650 mg via ORAL

## 2011-06-15 MED ORDER — HYDROXYZINE HCL 25 MG PO TABS
25.0000 mg | ORAL_TABLET | Freq: Four times a day (QID) | ORAL | Status: AC | PRN
  Administered 2011-06-16 – 2011-06-17 (×3): 25 mg via ORAL

## 2011-06-15 NOTE — Progress Notes (Signed)
Inpatient Diabetes Program Recommendations  AACE/ADA: New Consensus Statement on Inpatient Glycemic Control (2009)  Target Ranges:  Prepandial:   less than 140 mg/dL      Peak postprandial:   less than 180 mg/dL (1-2 hours)      Critically ill patients:  140 - 180 mg/dL   Reason for Visit: Hyperglycemia  Results for [name], [name] (MRN [id number]) as of 06/15/2011 14:53  Ref. Range 05/10/2009 20:57  Hemoglobin A1C Latest Range: 4.6-6.1 % 7.3 (H)  Results for [name], [name] (MRN [id number]) as of 06/15/2011 14:53  Ref. Range 06/13/2011 21:12 06/14/2011 06:08 06/14/2011 23:26  Glucose-Capillary Latest Range: 70-99 mg/dL 782 (H) 956 (H) 213 (H)    Inpatient Diabetes Program Recommendations Correction (SSI): Add Novolog sensitive tidwc  Note: Will follow.

## 2011-06-15 NOTE — Progress Notes (Signed)
Pt was placed on a 1:1 for her safety. Told both the MD and the PA that she wanted to kill herself and that she knew how to do it in the hospital this morning. She said it again this afternoon and both the PA and MD wrote the order for this. Pt stated then that she would not harm herself, but remains on the  1:1 anyway. Given support, reassurance and praise.  Ont he 1:1 and remains safe.
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
## 2011-06-15 NOTE — Progress Notes (Signed)
[facility] Group Notes:  (Counselor/Nursing/MHT/Case Management/Adjunct)  06/15/2011 12:23 PM   Type of Therapy:  Processing Group at 11:00 am  Participation Level:  Did Not Attend   [facility] Group Notes:  (Counselor/Nursing/MHT/Case Management/Adjunct)  06/15/2011 2:49 PM   Type of Therapy:  Counseling Group at 1:15 pm  Participation Level:  Adequate  Participation Quality:  Attentive and sharing  Affect:  Depressed  Cognitive:  Oriented  Insight:  Minimal  Engagement in Group:  Adequate  Engagement in Therapy:  Adequate  Modes of Intervention:  Education and exploration  Summary of Progress/Problems:  Patient was attentive to presentation by [facility] volunteer and expressed desire to attend [facility] program after discharge.  Pt was encouraged by others in group to attend [facility]  Ronda Fairly, LCSWA 06/15/2011 12:23 PM
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
## 2011-06-15 NOTE — H&P (Signed)
Signed for Dr. Rasul, MD. 
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
## 2011-06-16 LAB — HEPATITIS PANEL, ACUTE
Hep B C IgM: NEGATIVE
Hepatitis B Surface Ag: NEGATIVE

## 2011-06-16 LAB — HIV ANTIBODY (ROUTINE TESTING W REFLEX): HIV: NONREACTIVE

## 2011-06-16 LAB — GLUCOSE, CAPILLARY
Glucose-Capillary: 89 mg/dL (ref 70–99)
Glucose-Capillary: 84 mg/dL (ref 70–99)

## 2011-06-16 LAB — RPR: RPR Ser Ql: NONREACTIVE

## 2011-06-16 NOTE — Progress Notes (Signed)
Recreation Therapy Notes  06/16/2011         Time: 1415      Group Topic/Focus: The focus of this group is on enhancing patients' problem solving skills, which involves identifying the problem, brainstorming solutions and choosing and trying a solution.   Participation Level: Minimal  Participation Quality: Resistant  Affect: Labile  Cognitive: Alert  Additional Comments: At the beginning of group, the patient reported how much better she felt, but a few moments later became frustrated with the activity, reported she was in too much pain to participate, and left.   Dallis Darden 06/16/2011 2:49 PM

## 2011-06-16 NOTE — Progress Notes (Addendum)
[facility] Group Notes:  (Counselor/Nursing/MHT/Case Management/Adjunct)  06/16/2011 1:48 PM  Type of Therapy:  Group Therapy at 11AM  Participation Level:  Active  Participation Quality:  Drowsy and Sharing  Affect:  Depressed  Cognitive:  Oriented  Insight:  Limited  Engagement in Group:  Good  Engagement in Therapy:  Limited  Modes of Intervention:  Clarification, Education, Socialization and Support  Summary of Progress/Problems:  Lynn Palmer shared early in group process she felt depressed disgusted, yet determined to rise above this and stop lying to self and others on recovery community. Patient then became drowsy and was very quiet although she stated she was hearing everything that was discussed.   Clide Dales 06/16/2011, 1:48 PM

## 2011-06-16 NOTE — Discharge Planning (Signed)
Pt attended morning group. States that she is in a much better mood after having her meds switched. Pt requesting that she be taken off of 1:1. Pt happy about the visitation of a close friend during lunch whom she states is in recovery and has been sober for 6 years. States that she would like to explore the outpatient program here at the behavioral health hospital and feels that it will be attainable for her to get here by riding the bus. Pt states that she is also interested in exploring [facility] at [facility].

## 2011-06-16 NOTE — Progress Notes (Signed)
Patient ID: Lynn Palmer, female   DOB: 1960/07/02, 51 y.o.   MRN: [id number] Pt has  Stated that she is feeling much better today Denies SI thoughts depressed at 4, hopeless at 4. Continues to c/o chronic pain of knees  Head and thighs . Marland Kitchen1-1 note

## 2011-06-16 NOTE — Treatment Plan (Signed)
Interdisciplinary Treatment Plan Update (Adult)  Date: 06/16/2011  Time Reviewed: 9:27 AM   Progress in Treatment: Attending groups: Yes Participating in groups: Yes Taking medication as prescribed: Yes Tolerating medication: Yes   Family/Significant other contact made:  Yes, by counselor for suicide prevention info Patient understands diagnosis:  Yes  As evidenced by asking for help with substance abuse and getting on medication for mood stabilization Discussing patient identified problems/goals with staff:  Yes  See below Medical problems stabilized or resolved:  Yes Denies suicidal/homicidal ideation: Yes  In tx team Issues/concerns per patient self-inventory:  Yes  C/O pain,  Wants off 1:1,  C/O withdrawal symptoms,  Depression and hopelessness are 4s Other:  New problem(s) identified: N/A  Reason for Continuation of Hospitalization: Mania Medication stabilization Withdrawal symptoms  Interventions implemented related to continuation of hospitalization: Medication monitoring  Encourage group attendance and participation  Work with Cayley on pain mgmt.  Additional comments:  Estimated length of stay: 2-3 days  Discharge Plan: return home, follow up IOP  New goal(s): N/A  Review of initial/current patient goals per problem list:   1.  Goal(s): safely detox from alcohol and benzos  Met:  No  Target date:3/7  As evidenced [id number] in CIWA score to 0, stable vitals  2.  Goal (s): Refer for services post d/c  Met:  No  Target date:3/5  As evidenced by:CM to send email referral to [facility]  3.  Goal(s):Stabilize mood  Met:  No  Target date:3/7    As evidenced by: [name] will self report her depression at a 3 or less, and will exhibit no signs nor symptoms of mania   4.  Goal(s):Eliminate SI  Met:  Yes  Target date:3/5  As evidenced [id number] in tx team and on self inventory sheet  Attendees: Patient:  Lynn Palmer 06/16/2011 9:27 AM  Family:       Physician:  Lupe Carney 06/16/2011 9:27 AM   Nursing:Donna Shimp    06/16/2011 9:27 AM   Case Manager:  Richelle Ito, LCSW 06/16/2011 9:27 AM   Counselor:  Ronda Fairly, LCSWA 06/16/2011 9:27 AM   Other:  Verne Spurr 06/16/2011 9:27 AM  Other:     Other:     Other:      Scribe for Treatment Team:   Ida Rogue, 06/16/2011 9:27 AM
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
## 2011-06-16 NOTE — Progress Notes (Signed)
Recreation Therapy Group Note  Date: 06/16/2011          Time: 1000       Group Topic/Focus: Patient invited to participate in animal assisted therapy. Pets as a coping skill and responsibility were discussed.  Participation Level: Active  Participation Quality: Appropriate  Affect: Labile  Cognitive: Oriented   Additional Comments: Patient did well with the therapy dog, then became upset she still had a 1:1 and chose to leave group.

## 2011-06-16 NOTE — Progress Notes (Signed)
[facility] MD Progress Note  06/16/2011 2:26 PM   S/O: Patient seen and evaluated in team. Chart reviewed. Patient stated that her mood was "much better". Her affect was mood congruent and euthymic. She denied any current thoughts of self injurious behavior, suicidal ideation or homicidal ideation. There were no auditory or visual hallucinations, paranoia, delusional thought processes, or mania noted.  Thought process was linear and goal directed.  Speech was normal rate, tone and volume. Eye contact was good. Judgment and insight are fair.  Patient has been up and engaged on the unit.  No acute safety concerns reported from team.  Slept "better".  Wants to live for her "3 grand children and daughter".  Interested in our [facility] IOP program.  Sleep:  Number of Hours: 4.75    Vital Signs:Blood pressure 143/87, pulse 87, temperature 97.1 F (36.2 C), temperature source Oral, resp. rate 20.  Current Medications:   . busPIRone  15 mg Oral BID  . chlordiazePOXIDE  25 mg Oral QID   Followed by  . chlordiazePOXIDE  25 mg Oral TID   Followed by  . chlordiazePOXIDE  25 mg Oral BH-qamhs   Followed by  . chlordiazePOXIDE  25 mg Oral Daily  . diclofenac sodium   Topical Daily  . insulin aspart  0-9 Units Subcutaneous TID WC  . lisinopril  20 mg Oral Daily  . metFORMIN  500 mg Oral BID WC  . mulitivitamin with minerals  1 tablet Oral Daily  . QUEtiapine  100 mg Oral QHS  . QUEtiapine  25 mg Oral BID  . simvastatin  20 mg Oral q1800  . thiamine  100 mg Oral Daily  . Valproic Acid  500 mg Oral Q8H  . DISCONTD: QUEtiapine  25 mg Oral BID    Lab Results:  Results for orders placed during the hospital encounter of 06/13/11 (from the past 48 hour(s))  GLUCOSE, CAPILLARY     Status: Abnormal   Collection Time   06/14/11 11:26 PM      Component Value Range Comment   Glucose-Capillary 114 (*) 70 - 99 (mg/dL)    Comment 1 Notify RN      Comment 2 Documented in Chart     GC/CHLAMYDIA PROBE AMP, URINE     Status:  Normal   Collection Time   06/15/11  4:20 PM      Component Value Range Comment   GC Probe Amp, Urine NEGATIVE  NEGATIVE     Chlamydia, Swab/Urine, PCR NEGATIVE  NEGATIVE    GLUCOSE, CAPILLARY     Status: Normal   Collection Time   06/15/11  4:40 PM      Component Value Range Comment   Glucose-Capillary 94  70 - 99 (mg/dL)   RPR     Status: Normal   Collection Time   06/15/11  7:55 PM      Component Value Range Comment   RPR NON REACTIVE  NON REACTIVE    HIV ANTIBODY (ROUTINE TESTING)     Status: Normal   Collection Time   06/15/11  7:55 PM      Component Value Range Comment   HIV NON REACTIVE  NON REACTIVE    HEPATITIS PANEL, ACUTE     Status: Normal (Preliminary result)   Collection Time   06/15/11  7:55 PM      Component Value Range Comment   Hepatitis B Surface Ag NEGATIVE  NEGATIVE     HCV Ab NEGATIVE  NEGATIVE  Hep A IgM PENDING  NEGATIVE     Hep B C IgM PENDING  NEGATIVE    GLUCOSE, CAPILLARY     Status: Abnormal   Collection Time   06/15/11  9:00 PM      Component Value Range Comment   Glucose-Capillary 138 (*) 70 - 99 (mg/dL)    Comment 1 Notify RN     GLUCOSE, CAPILLARY     Status: Abnormal   Collection Time   06/16/11  6:27 AM      Component Value Range Comment   Glucose-Capillary 112 (*) 70 - 99 (mg/dL)    Comment 1 Notify RN     GLUCOSE, CAPILLARY     Status: Normal   Collection Time   06/16/11 11:51 AM      Component Value Range Comment   Glucose-Capillary 89  70 - 99 (mg/dL)    A/P: Pt improved significantly from yesterday.  Tolerating meds well w/o reported SEs.  Treatment goals and medication management reviewed with patient.  Continue current treatment plan and medications.  No acute medical or psychiatric issues noted.  Can come off 1:1.  Will cont q54mon checks for safety in light of past SI and detox.  Pt agreeable with plan.  Discussed with team.  Lupe Carney 06/16/2011, 2:26 PM

## 2011-06-17 DIAGNOSIS — M2550 Pain in unspecified joint: Secondary | ICD-10-CM

## 2011-06-17 DIAGNOSIS — M545 Low back pain: Secondary | ICD-10-CM | POA: Diagnosis present

## 2011-06-17 LAB — GLUCOSE, CAPILLARY
Glucose-Capillary: 84 mg/dL (ref 70–99)
Glucose-Capillary: 113 mg/dL — ABNORMAL HIGH (ref 70–99)
Glucose-Capillary: 93 mg/dL (ref 70–99)
Glucose-Capillary: 102 mg/dL — ABNORMAL HIGH (ref 70–99)

## 2011-06-17 MED ORDER — NAPROXEN SODIUM 550 MG PO TABS: 550.0000 mg | ORAL_TABLET | Freq: Two times a day (BID) | ORAL | Status: DC

## 2011-06-17 MED ORDER — NAPROXEN 500 MG PO TABS
500.0000 mg | ORAL_TABLET | Freq: Two times a day (BID) | ORAL | Status: DC
  Administered 2011-06-17 – 2011-06-19 (×4): 500 mg via ORAL
  Filled 2011-06-17 (×8): qty 1
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
## 2011-06-17 NOTE — Progress Notes (Signed)
Patient reported doing well during the assessment this afternoon. She was very concern about her elevated blood pressure; "I take 2 blood pressure medications at home and I don't think I'm getting any of my B/P medications". Pt on Lisinopril 20 mg daily; but she is not on metoprolol. Pt denied SI/HI and denied hallucinations. Her CBG below normal level at supper. !5 gram carbohydrate given and charge nurse notified PA. Rechecked patient CBG after 15 minutes, it went up to 93. Pt asymptomatic. Patient interacting well, attending groups and no acute problem reported by patient. Q 15 minute check continues to maintain safety.

## 2011-06-17 NOTE — Progress Notes (Signed)
[facility] Group Notes:  (Counselor/Nursing/MHT/Case Management/Adjunct)  06/17/2011 3:14 PM  Type of Therapy:  Group Therapy  Participation Level:  Active  Participation Quality:  Intrusive, Monopolizing and Redirectable  Affect:  Anxious  Cognitive:  Alert and Oriented  Insight:  Limited  Engagement in Group:  Good  Engagement in Therapy:  Limited  Modes of Intervention:  Limit-setting, Socialization and Redirection x 3  Summary of Progress/Problems:  Patient presents as feeling physically bad to the point she was moaning at several points.  Given opportunity twice to leave group by writer yet patient choose to stay vs checking in with nurse.  Patient  shared random aspects of her personal history at multiple intervals during group discussion; limits were set along with redirection met by resistance for some time.    Clide Dales 06/17/2011, 3:14 PM

## 2011-06-17 NOTE — Progress Notes (Signed)
Patient ID: Lynn Palmer, female   DOB: 24-Jul-1960, 51 y.o.   MRN: [id number]  Has been up and about and to groups, Has not been calmer and more focused today.  Self inventory: Sleep poor (had night mare), appetite good, energy normal, attention improving, Depression 4 hopeless 3, Several W/d symptoms in AM, denies SI thoughts, and c/o chronic pain in head,kneesand tights .  Prn medication given and was effective.

## 2011-06-17 NOTE — Progress Notes (Signed)
Pt up briefly at nurses desk stating that "she broke out in a sweat" and thirsty.  Fluids given and encouraged to read and back to bed. 15' checks cont for safety.
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
## 2011-06-17 NOTE — Discharge Planning (Signed)
Lynn Palmer continues to make steady progress.  Her mood is good, and she is focused on recovery.  Complaining that she did not hear from [facility] with IOP yesterday.  Assured her that I would call this AM to see if Dewayne Hatch can come see her today.

## 2011-06-17 NOTE — Progress Notes (Signed)
Patient ID: Lynn Palmer, female   DOB: 1961-03-22, 51 y.o.   MRN: [id number] Lynn Palmer  51 y.o.  [id number] June 28, 1960  06/17/2011   Diagnosis:  Bipolar disorder                      Alcohol abuse                      polysubstance abuse                        Subjective: Lynn Palmer is up and active in the group milieu.  She tells me she has been censored in group and that it did hurt her feelings.  She is feeling mentally and physically much better today but does state that she is not yet "out of the woods yet."  Lynn Palmer reports that she has joint pain and low back pain and is interested in trying Neurontin as she has been on that before.  Vital Signs:Blood pressure 140/94, pulse 93, temperature 96.9 F (36.1 C), temperature source Oral, resp. rate 16.   Objective: She is much less grandiose today and her lability is not as intense as it was 2 days ago.  She is able to focus and complete a sentence.  She is still somewhat circumstantial but denies SI/HI, and is in full contact with reality.  She is showing some improvement in her insight as her mania is resolving. Labs are reviewed and patient is informed of the results from her STI testing.  GC/CZ are not back yet.  Assessment:  Significantly improved, but still symptomatic  Medications Scheduled:  . busPIRone  15 mg Oral BID  . chlordiazePOXIDE  25 mg Oral QID   Followed by  . chlordiazePOXIDE  25 mg Oral TID   Followed by  . chlordiazePOXIDE  25 mg Oral BH-qamhs   Followed by  . chlordiazePOXIDE  25 mg Oral Daily  . diclofenac sodium   Topical Daily  . insulin aspart  0-9 Units Subcutaneous TID WC  . lisinopril  20 mg Oral Daily  . metFORMIN  500 mg Oral BID WC  . mulitivitamin with minerals  1 tablet Oral Daily  . naproxen sodium  550 mg Oral BID WC  . QUEtiapine  100 mg Oral QHS  . QUEtiapine  25 mg Oral BID  . simvastatin  20 mg Oral q1800  . thiamine  100 mg Oral Daily  . Valproic Acid  500 mg Oral Q8H   PRN  Meds acetaminophen, alum & mag hydroxide-simeth, hydrOXYzine, loperamide, magnesium hydroxide, ondansetron  Plan: Vitamin D level to check for symptoms of fatigue and joint pain.  Will also add Naproxyn Sodium for joint and muscle pain.  Will hold on Neurontin at this time. Continue current plan of care with the above changes as noted.  Rona Ravens. Elihu Milstein [facility] 06/17/2011 2:55 PM

## 2011-06-18 LAB — GLUCOSE, CAPILLARY
Glucose-Capillary: 105 mg/dL — ABNORMAL HIGH (ref 70–99)
Glucose-Capillary: 88 mg/dL (ref 70–99)

## 2011-06-18 MED ORDER — QUETIAPINE FUMARATE 200 MG PO TABS
200.0000 mg | ORAL_TABLET | Freq: Once | ORAL | Status: AC
  Administered 2011-06-18: 200 mg via ORAL

## 2011-06-18 MED ORDER — SIMVASTATIN 5 MG PO TABS: 5.0000 mg | ORAL_TABLET | Freq: Every day | ORAL | Status: DC

## 2011-06-18 MED ORDER — QUETIAPINE FUMARATE 200 MG PO TABS
200.0000 mg | ORAL_TABLET | Freq: Every day | ORAL | Status: DC
  Administered 2011-06-18: 200 mg via ORAL
  Filled 2011-06-18: qty 14
  Filled 2011-06-18 (×2): qty 1

## 2011-06-18 MED ORDER — SIMVASTATIN 20 MG PO TABS
20.0000 mg | ORAL_TABLET | Freq: Every day | ORAL | Status: DC
  Administered 2011-06-18: 20 mg via ORAL
  Filled 2011-06-18 (×3): qty 1

## 2011-06-18 MED ORDER — BUSPIRONE HCL 15 MG PO TABS: 15.0000 mg | ORAL_TABLET | Freq: Two times a day (BID) | ORAL | Status: DC

## 2011-06-18 MED ORDER — LISINOPRIL-HYDROCHLOROTHIAZIDE 20-25 MG PO TABS: 1.0000 | ORAL_TABLET | Freq: Every day | ORAL | Status: DC

## 2011-06-18 MED ORDER — QUETIAPINE FUMARATE 200 MG PO TABS
200.0000 mg | ORAL_TABLET | Freq: Once | ORAL | Status: DC
  Filled 2011-06-18 (×2): qty 1

## 2011-06-18 MED ORDER — QUETIAPINE FUMARATE 50 MG PO TABS
50.0000 mg | ORAL_TABLET | Freq: Two times a day (BID) | ORAL | Status: DC
  Administered 2011-06-18 – 2011-06-19 (×2): 50 mg via ORAL
  Filled 2011-06-18 (×3): qty 1
  Filled 2011-06-18 (×2): qty 28
  Filled 2011-06-18: qty 1

## 2011-06-18 MED ORDER — NORETHIN-ETH ESTRAD TRIPHASIC 0.5/0.75/1-35 MG-MCG PO TABS: 1.0000 | ORAL_TABLET | Freq: Every day | ORAL | Status: DC

## 2011-06-18 NOTE — Progress Notes (Signed)
Patient ID: Lynn Palmer, female   DOB: March 16, 1961, 51 y.o.   MRN: [id number] She has been up and about interacting with peers and staff.  1-1 was discontinued   this AM see observation note
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
## 2011-06-18 NOTE — Progress Notes (Signed)
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    Clide Dales 06/18/2011, 3:40 PM
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
## 2011-06-19 DIAGNOSIS — F319 Bipolar disorder, unspecified: Secondary | ICD-10-CM | POA: Diagnosis present

## 2011-06-19 LAB — GLUCOSE, CAPILLARY: Glucose-Capillary: 52 mg/dL — ABNORMAL LOW (ref 70–99)

## 2011-06-19 LAB — VALPROIC ACID LEVEL: Valproic Acid Lvl: 48 ug/mL — ABNORMAL LOW (ref 50.0–100.0)

## 2011-06-19 LAB — VITAMIN D 1,25 DIHYDROXY
Vitamin D 1, 25 (OH)2 Total: 26 pg/mL (ref 18–72)
Vitamin D3 1, 25 (OH)2: 26 pg/mL

## 2011-06-19 MED ORDER — QUETIAPINE FUMARATE 50 MG PO TABS: 50.0000 mg | ORAL_TABLET | Freq: Two times a day (BID) | ORAL | Status: DC

## 2011-06-19 MED ORDER — DIVALPROEX SODIUM 500 MG PO DR TAB: 500.0000 mg | DELAYED_RELEASE_TABLET | Freq: Three times a day (TID) | ORAL | Status: DC

## 2011-06-19 MED ORDER — QUETIAPINE FUMARATE 200 MG PO TABS: 200.0000 mg | ORAL_TABLET | Freq: Every day | ORAL | Status: DC

## 2011-06-19 MED ORDER — DIVALPROEX SODIUM ER 500 MG PO TB24
500.0000 mg | ORAL_TABLET | Freq: Three times a day (TID) | ORAL | Status: DC
  Filled 2011-06-19 (×3): qty 42

## 2011-06-19 NOTE — Progress Notes (Signed)
Pt has been cooperative this evening in hopes of discharging tomorrow. Pt is reporting some tremors, but none observed at this present time. Pt is highly anxious. Pt took a bath to help soothe her for the evening. Pt reports talking to her Bf and having a better conversation than last night. Last night conversation with her bf caused her to become highly upset and placed on a 1:1 for SI. Continued support and availability has been extended to this pt. Pt safety remains with q91min checks.

## 2011-06-19 NOTE — Progress Notes (Signed)
Patient ID: Lynn Palmer, female   DOB: October 08, 1960, 51 y.o.   MRN: [id number]  [facility] MD Progress Note   S/O: Patient seen and evaluated in team. Chart reviewed. Patient stated that her mood was "much better" and she explained why she became so upset overnight at [facility]. Her affect was mood congruent and euthymic. She denied any current thoughts of self injurious behavior, suicidal ideation or homicidal ideation. There were no auditory or visual hallucinations, paranoia, delusional thought processes, or mania noted.  Thought process was linear and goal directed.  Speech was normal rate, tone and volume. Eye contact was good. Judgment and insight are limited.  She acted out last night, but reconstituted this am.  Agreeable now to f/u with [facility] IOP and a therapist.  A/P: Pt improved significantly from last night.  Tolerating meds well w/o reported SEs.  Treatment goals and medication management reviewed with patient.  Continue current treatment plan and medications.  No acute medical or psychiatric issues noted.  Can come off 1:1.  Will cont q57mon checks for safety in light of past SI, detox and unpredictable behavior.  Pt agreeable with plan.  Discussed with team.

## 2011-06-19 NOTE — Treatment Plan (Signed)
Interdisciplinary Treatment Plan Update (Adult)  Date: 06/19/2011  Time Reviewed: 8:01 AM   Progress in Treatment: Attending groups: Yes Participating in groups: Yes Taking medication as prescribed: Yes Tolerating medication: Yes   Family/Significant other contact made:   Patient understands diagnosis:  Yes Discussing patient identified problems/goals with staff:  Yes Medical problems stabilized or resolved:  Yes Denies suicidal/homicidal ideation: Yes Issues/concerns per patient self-inventory:  None  All positive comments Other:  New problem(s) identified: N/A  Reason for Continuation of Hospitalization: Other; describe D/C today  Interventions implemented related to continuation of hospitalization:   Additional comments:  Estimated length of stay: D/C today  Discharge Plan:  Return home,  Follow up [facility]  New goal(s): N/A  Review of initial/current patient goals per problem list:   1.  Goal(s):Safely detox from alcohol and benzos  Met:  Yes  Target date:3/7   As evidenced AV:[id number] in CIWA score, stable vitals  2.  Goal (s):Refer to services  Met:  Yes  Target date:3/7  As evidenced YN:[id number] time and date with IOP  3.  Goal(s):Stabilize mood  Met:  Yes  Target date:3/8  As evidenced MV:HQIONGE of mania, self report of no depression   4.  Goal(s):Eliminate SI  Met:  Yes  Target date:3/5 As evidenced XB:MWUXLK denies SI today Attendees: Patient:  Lynn Palmer 06/19/2011 8:01 AM  Family:     Physician:  Lupe Carney 06/19/2011 8:01 AM   Nursing: dawn Placke   06/19/2011 8:01 AM   Case Manager:  Richelle Ito, LCSW 06/19/2011 8:01 AM   Counselor:  Ronda Fairly, LCSWA 06/19/2011 8:01 AM   Other:     Other:     Other:     Other:      Scribe for Treatment Team:   Ida Rogue, 06/19/2011 8:01 AM

## 2011-06-19 NOTE — Progress Notes (Signed)
Unit Secretary called lab to let them know that there is an order for a STAT Depakote level for patient. [name] from the lab stated that it would be 1 pm before the lab could come to draw Depakote level. Patient to discharge at 4 pm. Patient denies SI/HI or voices. Eager to discharge to home and begin CD-IOP program on Wednesday 06/24/11. Her sponsor will pick her up at discharge. No complaints and no further questions voiced. Patient encouraged to review coping skills prior to D/C.

## 2011-06-19 NOTE — Progress Notes (Signed)
[facility] Case Management Discharge Plan:  Will you be returning to the same living situation after discharge: Yes,  home At discharge, do you have transportation home?:Yes,  boyfriend Do you have the ability to pay for your medications:Yes,  insurance, income  Interagency Information:     Release of information consent forms completed and in the chart;  Patient's signature needed at discharge.  Patient to Follow up at:  Follow-up Information    Follow up with [facility] on 06/23/2011. (Assessment with Ann in the AM, start the program on Wed afternoon)    Contact information:   [address]  [phone]          Patient denies SI/HI:   Yes,  yes    Safety Planning and Suicide Prevention discussed:  Yes,  yes  Barrier to discharge identified:No.  Summary and Recommendations:   Lynn Palmer 06/19/2011, 11:27 AM
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
## 2011-06-19 NOTE — Progress Notes (Signed)
Discharge instructions reviewed with pt. On medications, dosages and follow -up appointment.  Pt. Verbalized an understanding of instructions.  All belongings returned to pt. Supplies given.  Pt. Denies SI/HI.  Support given.

## 2011-06-19 NOTE — [facility] Suicide Risk Assessment (Signed)
Suicide Risk Assessment  Discharge Assessment      Demographic factors:  See chart.  Current Mental Status:  Patient seen and evaluated in team. Chart reviewed. Patient stated that her mood was "great" and she has been stable on current meds.  Her affect was mood congruent and euthymic. She denied any current thoughts of self injurious behavior, suicidal ideation or homicidal ideation. There were no auditory or visual hallucinations, paranoia, delusional thought processes, or mania noted. Thought process was linear and goal directed. Speech was normal rate, tone and volume. Eye contact was good. Judgment and insight are improved. Agreeable now to f/u with CD IOP and a therapist, intake Tues.  Loss Factors:  Decline in physical health;Financial problems / change in socioeconomic status  Historical Factors:  Multiple past suicide attempts   Risk Reduction Factors: Living with another person, especially a relative;Positive social support; willingness to f/u with CD IOP; AA/NA; improved insight  CLINICAL FACTORS: Polysubstance Dependence; Mood Disorder NOS; Hx BPAD and BPD; W/D resolved.  VS:  Filed Vitals:   06/19/11 0625  BP: 135/85  Pulse: 75  Temp:   Resp:     Meds:    . busPIRone  15 mg Oral BID  . chlordiazePOXIDE  25 mg Oral BH-qamhs   Followed by  . chlordiazePOXIDE  25 mg Oral Daily  . diclofenac sodium   Topical Daily  . insulin aspart  0-9 Units Subcutaneous TID WC  . lisinopril  20 mg Oral Daily  . metFORMIN  500 mg Oral BID WC  . mulitivitamin with minerals  1 tablet Oral Daily  . naproxen  500 mg Oral BID WC  . norethindrone-ethinyl estradiol  1 tablet Oral Daily  . QUEtiapine  200 mg Oral QHS  . QUEtiapine  50 mg Oral BID  . simvastatin  20 mg Oral q1800  . thiamine  100 mg Oral Daily  . Valproic Acid  500 mg Oral Q8H  . DISCONTD: busPIRone  15 mg Oral BID  . DISCONTD: lisinopril-hydrochlorothiazide  1 tablet Oral Daily  . DISCONTD: simvastatin  20 mg Oral  q1800  . DISCONTD: simvastatin  5 mg Oral q1800    COGNITIVE FEATURES THAT CONTRIBUTE TO RISK: none.  SUICIDE RISK: Pt viewed as a chronic increased risk of harm to self in light of her past hx and risk factors.    PLAN OF CARE: Pt stable for and requesting discharge. Pt contracting for safety and does not currently meet [place] involuntary commitment criteria for continued hospitalization against her will.  Mental health treatment, medication management and continued sobriety will mitigate against the increased risk of harm to self and/or others.  Discussed the importance of recovery further with pt, as well as, tools to move forward in a healthy & safe manner.  Pt agreeable with the plan.  Discussed with the team.  Please see orders, follow up plans per team and full discharge summary completed by physician extender. Va level pending before final discharge. Ride coming at 4pm.

## 2011-06-19 NOTE — Progress Notes (Signed)
[facility] Group Notes:  (Counselor/Nursing/MHT/Case Management/Adjunct)  06/19/2011 4:15 PM  Type of Therapy:  Group Therapy at 1:15  Participation Level:  Did Not Attend   Clide Dales 06/19/2011, 4:15 PM

## 2011-06-24 ENCOUNTER — Ambulatory Visit (HOSPITAL_COMMUNITY): Payer: Medicare Other

## 2011-06-24 ENCOUNTER — Other Ambulatory Visit (HOSPITAL_COMMUNITY): Payer: Medicare Other | Attending: Physician Assistant | Admitting: Psychology

## 2011-06-24 ENCOUNTER — Encounter (HOSPITAL_COMMUNITY): Payer: Self-pay | Admitting: Psychology

## 2011-06-24 DIAGNOSIS — F1910 Other psychoactive substance abuse, uncomplicated: Secondary | ICD-10-CM | POA: Insufficient documentation

## 2011-06-24 DIAGNOSIS — Z9884 Bariatric surgery status: Secondary | ICD-10-CM | POA: Insufficient documentation

## 2011-06-24 DIAGNOSIS — M2550 Pain in unspecified joint: Secondary | ICD-10-CM | POA: Insufficient documentation

## 2011-06-24 DIAGNOSIS — F1920 Other psychoactive substance dependence, uncomplicated: Secondary | ICD-10-CM

## 2011-06-24 DIAGNOSIS — I10 Essential (primary) hypertension: Secondary | ICD-10-CM | POA: Insufficient documentation

## 2011-06-24 DIAGNOSIS — M545 Low back pain, unspecified: Secondary | ICD-10-CM | POA: Insufficient documentation

## 2011-06-24 DIAGNOSIS — F1020 Alcohol dependence, uncomplicated: Secondary | ICD-10-CM | POA: Insufficient documentation

## 2011-06-24 DIAGNOSIS — E119 Type 2 diabetes mellitus without complications: Secondary | ICD-10-CM | POA: Insufficient documentation

## 2011-06-24 DIAGNOSIS — F319 Bipolar disorder, unspecified: Secondary | ICD-10-CM | POA: Insufficient documentation

## 2011-06-24 NOTE — Progress Notes (Signed)
Patient Discharge Instructions:  Psychiatric Admission Assessment Note Provided,  06/24/2011 Discharge Summary Note Provided,   06/24/2011 After Visit Summary (AVS) Provided,  06/24/2011 Face Sheet Provided, 06/24/2011 Faxed/Sent to the Next Level Care provider:  06/24/2011 Next Level Care Provider Has Access to the EMR, 06/24/2011  Records were provided to [facility] via [facility]/Epic access.  Wandra Scot, 06/24/2011, 11:23 AM

## 2011-06-25 LAB — PRESCRIPTION ABUSE MONITORING 17P, URINE
Amphetamine/Meth: NEGATIVE ng/mL
Barbiturate Screen, Urine: NEGATIVE ng/mL
Buprenorphine, Urine: NEGATIVE ng/mL
Cannabinoid Scrn, Ur: NEGATIVE ng/mL
Carisoprodol, Urine: NEGATIVE ng/mL
Cocaine Metabolites: NEGATIVE ng/mL
Fentanyl, Ur: NEGATIVE ng/mL
Opiate Screen, Urine: NEGATIVE ng/mL
Oxycodone Screen, Ur: NEGATIVE ng/mL
Tapentadol, urine: NEGATIVE ng/mL
Tramadol Scrn, Ur: POSITIVE ng/mL — ABNORMAL HIGH

## 2011-06-25 LAB — ALCOHOL METABOLITE (ETG), URINE: Ethyl Glucuronide (EtG): NEGATIVE ng/mL

## 2011-06-25 NOTE — Progress Notes (Signed)
    Daily Group Progress Note  Program: CD-IOP   Group Time: 1-2:30 pm  Participation Level: Active  Behavioral Response: Appropriate  Type of Therapy: Psycho-education Group  Topic: Regaining Trust: First part of group spent in psycho- education concerning the issue of trust. A handout was provided that included identifying ways that the recovering person can regain trust as well as things that the family can do to rebuild trust. The handout generated a lively discussion on the topic of trust and the struggles that group members have experienced both in their addiction and now in recovery.   Group Time: 2:45-4 pm  Participation Level: Active  Behavioral Response: Appropriate and Sharing  Type of Therapy: Process Group  Topic: Group Process; second half of group spent in process. Members discussed current issues they are dealing with. There were 2 new group members present and they introduced themselves. One of the new members seemed to minimize his alcohol use and the group had numerous questions for him. There was good disclosure and feedback among the group.   Summary: Patient was new to the group and offered her own experiences around trust and the loss of trust among family and friends. She reported she has been battling addiction for 30+ years and been in AA since she was 51 yo. She reported she had had a lap band placed around her stomach and she had lost 120 lbs since the surgery 2 years ago. In process, when asked to introduce herself, the patient shared more about her addiction and the many problems she has had because of her drinking and drugging. The patient reported she had had over 4 years of sobriety at least twice, but had always messed up thinking she could have just 1. She was very open about her struggles and received good feedback and support from the group.    Family Program: Family present? No   Name of family member(s):   UDS collected: Yes Results: Not  available  AA/NA attended?: YesMonday, Tuesday, Wednesday, Thursday, Friday, Saturday and Sunday  Sponsor?: Yes   Dante Cooter, LCAS
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
## 2011-06-26 ENCOUNTER — Other Ambulatory Visit (HOSPITAL_COMMUNITY): Payer: Medicare Other | Admitting: Psychology

## 2011-06-26 LAB — BENZODIAZEPINES (GC/LC/MS), URINE
Alprazolam metabolite (GC/LC/MS), ur confirm: NEGATIVE NG/ML
Clonazepam metabolite (GC/LC/MS), ur confirm: 130 NG/ML — ABNORMAL HIGH
Flurazepam metabolite (GC/LC/MS), ur confirm: NEGATIVE NG/ML
Lorazepam (GC/LC/MS), ur confirm: NEGATIVE NG/ML
Midazolam (GC/LC/MS), ur confirm: NEGATIVE NG/ML
Temazepam (GC/LC/MS), ur confirm: NEGATIVE NG/ML
Triazolam metabolite (GC/LC/MS), ur confirm: NEGATIVE NG/ML

## 2011-06-26 LAB — TRAMADOL, URINE: N-DESMETHYL-CIS-TRAMADOL: 5270 NG/ML — ABNORMAL HIGH

## 2011-06-29 ENCOUNTER — Other Ambulatory Visit (HOSPITAL_COMMUNITY): Payer: Medicare Other
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
## 2011-06-30 ENCOUNTER — Encounter (HOSPITAL_COMMUNITY): Payer: Self-pay | Admitting: Emergency Medicine

## 2011-06-30 ENCOUNTER — Emergency Department (HOSPITAL_COMMUNITY)
Admission: EM | Admit: 2011-06-30 | Discharge: 2011-07-01 | Disposition: A | Discharge status: Other Acute Inpatient Hospital | Payer: Medicare Other | Attending: Emergency Medicine | Admitting: Emergency Medicine

## 2011-06-30 ENCOUNTER — Ambulatory Visit (HOSPITAL_COMMUNITY)
Admission: RE | Admit: 2011-06-30 | Discharge: 2011-06-30 | Disposition: A | Discharge status: Home / Self Care | Payer: 59 | Source: Intra-hospital | Attending: Psychiatry | Admitting: Psychiatry

## 2011-06-30 DIAGNOSIS — Z046 Encounter for general psychiatric examination, requested by authority: Secondary | ICD-10-CM | POA: Insufficient documentation

## 2011-06-30 DIAGNOSIS — F319 Bipolar disorder, unspecified: Secondary | ICD-10-CM | POA: Insufficient documentation

## 2011-06-30 DIAGNOSIS — F39 Unspecified mood [affective] disorder: Secondary | ICD-10-CM | POA: Insufficient documentation

## 2011-06-30 DIAGNOSIS — E785 Hyperlipidemia, unspecified: Secondary | ICD-10-CM | POA: Insufficient documentation

## 2011-06-30 DIAGNOSIS — Z79899 Other long term (current) drug therapy: Secondary | ICD-10-CM | POA: Insufficient documentation

## 2011-06-30 DIAGNOSIS — E119 Type 2 diabetes mellitus without complications: Secondary | ICD-10-CM | POA: Insufficient documentation

## 2011-06-30 DIAGNOSIS — I10 Essential (primary) hypertension: Secondary | ICD-10-CM | POA: Insufficient documentation

## 2011-06-30 LAB — CBC
HCT: 39.4 % (ref 36.0–46.0)
Hemoglobin: 13 g/dL (ref 12.0–15.0)
MCHC: 33 g/dL (ref 30.0–36.0)
RBC: 4.49 MIL/uL (ref 3.87–5.11)

## 2011-06-30 LAB — COMPREHENSIVE METABOLIC PANEL
Alkaline Phosphatase: 50 U/L (ref 39–117)
BUN: 24 mg/dL — ABNORMAL HIGH (ref 6–23)
CO2: 26 mEq/L (ref 19–32)
Chloride: 97 mEq/L (ref 96–112)
GFR calc Af Amer: 73 mL/min — ABNORMAL LOW (ref >90)
GFR calc non Af Amer: 63 mL/min — ABNORMAL LOW (ref >90)
Glucose, Bld: 112 mg/dL — ABNORMAL HIGH (ref 70–99)
Potassium: 3.9 mEq/L (ref 3.5–5.1)
Total Bilirubin: 0.2 mg/dL — ABNORMAL LOW (ref 0.3–1.2)
Total Protein: 6.8 g/dL (ref 6.0–8.3)

## 2011-06-30 LAB — RAPID URINE DRUG SCREEN, HOSP PERFORMED: Barbiturates: NOT DETECTED

## 2011-06-30 LAB — ETHANOL: Alcohol, Ethyl (B): <11 mg/dL (ref 0–11)

## 2011-06-30 LAB — VALPROIC ACID LEVEL: Valproic Acid Lvl: 86.9 ug/mL (ref 50.0–100.0)

## 2011-06-30 LAB — URINALYSIS, ROUTINE W REFLEX MICROSCOPIC
Glucose, UA: NEGATIVE mg/dL
Leukocytes, UA: NEGATIVE
Nitrite: NEGATIVE
Specific Gravity, Urine: 1.023 (ref 1.005–1.030)
pH: 7.5 (ref 5.0–8.0)

## 2011-06-30 MED ORDER — ZOLPIDEM TARTRATE 5 MG PO TABS
5.0000 mg | ORAL_TABLET | Freq: Every evening | ORAL | Status: DC | PRN
  Administered 2011-07-01: 5 mg via ORAL
  Filled 2011-06-30: qty 1

## 2011-06-30 MED ORDER — ALUM & MAG HYDROXIDE-SIMETH 200-200-20 MG/5ML PO SUSP: 30.0000 mL | ORAL | Status: DC | PRN

## 2011-06-30 MED ORDER — IBUPROFEN 600 MG PO TABS: 600.0000 mg | ORAL_TABLET | Freq: Three times a day (TID) | ORAL | Status: DC | PRN

## 2011-06-30 MED ORDER — LORAZEPAM 1 MG PO TABS
1.0000 mg | ORAL_TABLET | Freq: Three times a day (TID) | ORAL | Status: DC | PRN
  Administered 2011-07-01: 1 mg via ORAL
  Filled 2011-06-30: qty 1

## 2011-06-30 MED ORDER — ACETAMINOPHEN 325 MG PO TABS: 650.0000 mg | ORAL_TABLET | ORAL | Status: DC | PRN

## 2011-06-30 MED ORDER — NICOTINE 21 MG/24HR TD PT24
21.0000 mg | MEDICATED_PATCH | Freq: Every day | TRANSDERMAL | Status: DC
  Filled 2011-06-30: qty 1

## 2011-06-30 MED ORDER — ONDANSETRON HCL 4 MG PO TABS: 4.0000 mg | ORAL_TABLET | Freq: Three times a day (TID) | ORAL | Status: DC | PRN

## 2011-06-30 NOTE — ED Notes (Signed)
Pt. Has 4 bags of belongings in psych ed locker, locker unable to close.  Transferred 4 bags of belongs into psych ed activity room for safe storage.

## 2011-06-30 NOTE — ED Provider Notes (Signed)
History     CSN: [id number]  Arrival date & time 06/30/11  1054   First MD Initiated Contact with Patient 06/30/11 1155      Chief Complaint  Patient presents with  . Medical Clearance    (Consider location/radiation/quality/duration/timing/severity/associated sxs/prior treatment) The history is provided by the patient.   Patient here with increased depression secondary to recent relationship ending. Patient has suicidal ideations with plans to take pills. History of suicide attempt x20 in the past. Patient denies any current ingestions at this time. Denies any are toward or visual hallucinations. Has been compliant with her current medications. No recent fever or illnesses Past Medical History  Diagnosis Date  . Bipolar 1 disorder   . Obesity   . Diabetes mellitus   . Hyperlipidemia   . Hypertension   . Vomiting   . Depression   . Anxiety   . Substance abuse     alcohol   . Seizures     as a teenager    Past Surgical History  Procedure Date  . Tonsillectomy   . Laparoscopic gastric banding 06/11/09    No family history on file.  History  Substance Use Topics  . Smoking status: Former Games developer  . Smokeless tobacco: Never Used  . Alcohol Use: Yes     ETOH (2) 24 oz 8% alcohol     OB History    Grav Para Term Preterm Abortions TAB SAB Ect Mult Living                  Review of Systems  All other systems reviewed and are negative.    Allergies  Review of patient's allergies indicates no known allergies.  Home Medications   Current Outpatient Rx  Name Route Sig Dispense Refill  . BUSPIRONE HCL 10 MG PO TABS Oral Take 10 mg by mouth 3 (three) times daily.    Marland Kitchen CLONAZEPAM 0.5 MG PO TABS Oral Take 0.5 mg by mouth 2 (two) times daily as needed.    Marland Kitchen DICLOFENAC SODIUM 1 % TD GEL Topical Apply 1 application topically daily.    Marland Kitchen DIVALPROEX SODIUM 500 MG PO TBEC Oral Take 1 tablet (500 mg total) by mouth 3 (three) times daily. For mood stabilization and mania.  90 tablet 0  . ENSURE PLUS PO LIQD Oral Take 237 mLs by mouth as needed.    Marland Kitchen LISINOPRIL-HYDROCHLOROTHIAZIDE 20-25 MG PO TABS Oral Take 1 tablet by mouth daily.     Marland Kitchen LOPERAMIDE HCL 2 MG PO CAPS Oral Take 2 mg by mouth 4 (four) times daily as needed.    Marland Kitchen MAGNESIUM HYDROXIDE 400 MG/5ML PO SUSP Oral Take 15 mLs by mouth daily as needed. For constipation    . METFORMIN HCL 500 MG PO TABS  2 (two) times daily.     Marland Kitchen METOPROLOL TARTRATE 50 MG PO TABS  2 (two) times daily.     Marland Kitchen NORTREL 7/7/7 0.5/0.75/1-35 MG-MCG PO TABS  Daily.    Marland Kitchen MELATONIN PO Oral Take 1 capsule by mouth.    Marland Kitchen PRAVASTATIN SODIUM 20 MG PO TABS Oral Take 20 mg by mouth daily.      . QUETIAPINE FUMARATE 200 MG PO TABS Oral Take 1 tablet (200 mg total) by mouth at bedtime. For mood stabilization and anxiety. 30 tablet 0  . QUETIAPINE FUMARATE 50 MG PO TABS Oral Take 50 mg by mouth 2 (two) times daily. In the morning and at lunch for anxiety and mood stability.    Marland Kitchen  TRAMADOL HCL 50 MG PO TABS Oral Take 50 mg by mouth every 6 (six) hours as needed. For pain    . ZOLPIDEM TARTRATE 10 MG PO TABS Oral Take 10 mg by mouth at bedtime as needed.      BP 140/81  Pulse 103  Temp 98 F (36.7 C)  Resp 20  SpO2 99%  Physical Exam  Nursing note and vitals reviewed. Constitutional: She is oriented to person, place, and time. She appears well-developed and well-nourished.  Non-toxic appearance. No distress.  HENT:  Head: Normocephalic and atraumatic.  Eyes: Conjunctivae, EOM and lids are normal. Pupils are equal, round, and reactive to light.  Neck: Normal range of motion. Neck supple. No tracheal deviation present. No mass present.  Cardiovascular: Normal rate, regular rhythm and normal heart sounds.  Exam reveals no gallop.   No murmur heard. Pulmonary/Chest: Effort normal and breath sounds normal. No stridor. No respiratory distress. She has no decreased breath sounds. She has no wheezes. She has no rhonchi. She has no rales.    Abdominal: Soft. Normal appearance and bowel sounds are normal. She exhibits no distension. There is no tenderness. There is no rebound and no CVA tenderness.  Musculoskeletal: Normal range of motion. She exhibits no edema and no tenderness.  Neurological: She is alert and oriented to person, place, and time. She has normal strength. No cranial nerve deficit or sensory deficit. GCS eye subscore is 4. GCS verbal subscore is 5. GCS motor subscore is 6.  Skin: Skin is warm and dry. No abrasion and no rash noted.  Psychiatric: Her speech is normal and behavior is normal. Her affect is blunt. Thought content is not delusional. She expresses suicidal ideation. She expresses no homicidal ideation. She expresses suicidal plans. She expresses no homicidal plans.    ED Course  Procedures (including critical care time)   Labs Reviewed  CBC  COMPREHENSIVE METABOLIC PANEL  ETHANOL  URINE RAPID DRUG SCREEN (HOSP PERFORMED)  URINALYSIS, ROUTINE W REFLEX MICROSCOPIC   No results found.   No diagnosis found.    MDM  Pt cleared medically, spoke with act, will place        Toy Baker, MD 07/01/11 [phone]
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
## 2011-06-30 NOTE — ED Notes (Addendum)
Pt requested/allowed to take a shower, reports she is having night sweats and her scrubs are soaked and wet, pt also reports she has had the lap band and she needs to eat a certain amount of meals per day, nurse on previous shift reported that pt had eaten  100 % of all meal trays and continuously requested to eat t/o the day.
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
## 2011-06-30 NOTE — ED Notes (Signed)
Pt states that she was just here 1 wk ago and today her and her boyfriend broke up and he does not respect her for who is she is. Pt states that she stopped drinking and was going to out pt therapy for this. Pt states that she wants to stay inpatient treatment and that she wants to take pills at home to harm herself. States that she is si not hi.

## 2011-06-30 NOTE — [facility] Counselor (Signed)
Patient assessed at [facility] and sent to the ED for medical clearance. Patient has not been accepted at [facility]. Writer contacted [facility] and made them aware that patient is now medically cleared, per Dr. Leonel Ramsay.   Patient is pending in-pt admission to [facility]. No bed assignment.   Meanwhile, Clinical research associate will continue to find alternative placement. Referred patient to [facility], [facility], and [facility].

## 2011-06-30 NOTE — ED Notes (Signed)
Report given to Jennifer RN

## 2011-07-01 ENCOUNTER — Other Ambulatory Visit (HOSPITAL_COMMUNITY): Payer: Medicare Other

## 2011-07-01 ENCOUNTER — Encounter (HOSPITAL_COMMUNITY): Payer: Self-pay | Admitting: Psychology

## 2011-07-01 MED ORDER — DIVALPROEX SODIUM 500 MG PO DR TAB
500.0000 mg | DELAYED_RELEASE_TABLET | Freq: Three times a day (TID) | ORAL | Status: DC
  Administered 2011-07-01: 500 mg via ORAL
  Filled 2011-07-01: qty 1

## 2011-07-01 MED ORDER — QUETIAPINE FUMARATE 50 MG PO TABS
50.0000 mg | ORAL_TABLET | Freq: Two times a day (BID) | ORAL | Status: DC
  Administered 2011-07-01: 50 mg via ORAL
  Filled 2011-07-01: qty 1

## 2011-07-01 MED ORDER — BUSPIRONE HCL 10 MG PO TABS
10.0000 mg | ORAL_TABLET | Freq: Three times a day (TID) | ORAL | Status: DC
  Administered 2011-07-01: 10 mg via ORAL
  Filled 2011-07-01: qty 1

## 2011-07-01 MED ORDER — SIMVASTATIN 10 MG PO TABS
10.0000 mg | ORAL_TABLET | Freq: Every day | ORAL | Status: DC
  Filled 2011-07-01: qty 1

## 2011-07-01 MED ORDER — METOPROLOL TARTRATE 25 MG PO TABS
25.0000 mg | ORAL_TABLET | Freq: Two times a day (BID) | ORAL | Status: DC
  Administered 2011-07-01: 25 mg via ORAL
  Filled 2011-07-01: qty 1

## 2011-07-01 MED ORDER — LISINOPRIL 20 MG PO TABS
20.0000 mg | ORAL_TABLET | Freq: Every day | ORAL | Status: DC
  Administered 2011-07-01: 20 mg via ORAL
  Filled 2011-07-01: qty 1

## 2011-07-01 MED ORDER — NORETHIN-ETH ESTRAD TRIPHASIC 0.5/0.75/1-35 MG-MCG PO TABS
1.0000 | ORAL_TABLET | Freq: Every day | ORAL | Status: DC
  Administered 2011-07-01: 1 via ORAL

## 2011-07-01 MED ORDER — HYDROCHLOROTHIAZIDE 25 MG PO TABS
25.0000 mg | ORAL_TABLET | Freq: Every day | ORAL | Status: DC
  Administered 2011-07-01: 25 mg via ORAL
  Filled 2011-07-01: qty 1

## 2011-07-01 MED ORDER — QUETIAPINE FUMARATE 100 MG PO TABS: 200.0000 mg | ORAL_TABLET | Freq: Every day | ORAL | Status: DC

## 2011-07-01 MED ORDER — QUETIAPINE FUMARATE 50 MG PO TABS: 50.0000 mg | ORAL_TABLET | Freq: Two times a day (BID) | ORAL | Status: DC

## 2011-07-01 MED ORDER — METFORMIN HCL 500 MG PO TABS
1000.0000 mg | ORAL_TABLET | Freq: Two times a day (BID) | ORAL | Status: DC
  Administered 2011-07-01: 1000 mg via ORAL
  Filled 2011-07-01 (×2): qty 2

## 2011-07-01 MED ORDER — LISINOPRIL-HYDROCHLOROTHIAZIDE 20-25 MG PO TABS: 1.0000 | ORAL_TABLET | Freq: Every day | ORAL | Status: DC

## 2011-07-01 NOTE — ED Notes (Signed)
Per pass-off report, pt was declined by Dr. Dan Humphreys @ [facility] and recommended [facility]. TC with [facility] @ [facility] who stated they will have beds available today. Faxed information over for review.

## 2011-07-01 NOTE — ED Notes (Signed)
TC from [facility] @ [facility]. Pt accepted by Dr. Wendall Stade. RN Report to [phone]. Going to [facility] [address]. Updated RN & EDP.

## 2011-07-01 NOTE — ED Notes (Signed)
Info faxed to [facility]. TC from [place] @ [facility]. Needed some additional information, provided & informed they will review and return call regarding disposition.

## 2011-07-02 ENCOUNTER — Telehealth (HOSPITAL_COMMUNITY): Payer: Self-pay | Admitting: Psychology

## 2011-07-02 NOTE — Progress Notes (Signed)
Patient ID: Lynn Palmer, female   DOB: 01-23-1961, 51 y.o.   MRN: [id number] The patient did not appear in group today nor phone to explain her absence. I attempted to contact this patient at least 5 times today, but her phone just keeps ringing with no voice mail available. Was informed by medical director that she had left a teary message on his voice mail also requesting that he contact her. If the patient does not contact me or appear on Friday, she will be discharged from the program.

## 2011-07-03 ENCOUNTER — Other Ambulatory Visit (HOSPITAL_COMMUNITY): Payer: Medicare Other

## 2011-07-06 ENCOUNTER — Other Ambulatory Visit (HOSPITAL_COMMUNITY): Payer: Medicare Other

## 2011-07-08 ENCOUNTER — Other Ambulatory Visit (HOSPITAL_COMMUNITY): Payer: Medicare Other

## 2011-07-10 ENCOUNTER — Other Ambulatory Visit (HOSPITAL_COMMUNITY): Payer: Medicare Other

## 2011-07-13 ENCOUNTER — Other Ambulatory Visit (HOSPITAL_COMMUNITY): Payer: Medicare Other | Attending: Physician Assistant

## 2011-07-15 ENCOUNTER — Other Ambulatory Visit (HOSPITAL_COMMUNITY): Payer: Medicare Other

## 2011-07-16 ENCOUNTER — Ambulatory Visit (HOSPITAL_COMMUNITY)
Admission: RE | Admit: 2011-07-16 | Discharge: 2011-07-16 | Disposition: A | Discharge status: Home / Self Care | Payer: Medicare Other | Source: Ambulatory Visit | Attending: Internal Medicine | Admitting: Internal Medicine

## 2011-07-16 DIAGNOSIS — Z1231 Encounter for screening mammogram for malignant neoplasm of breast: Secondary | ICD-10-CM

## 2011-07-17 ENCOUNTER — Other Ambulatory Visit (HOSPITAL_COMMUNITY): Payer: Medicare Other

## 2011-07-20 ENCOUNTER — Other Ambulatory Visit (HOSPITAL_COMMUNITY): Payer: Medicare Other

## 2011-07-22 ENCOUNTER — Other Ambulatory Visit (HOSPITAL_COMMUNITY): Payer: Medicare Other

## 2011-07-24 ENCOUNTER — Other Ambulatory Visit (HOSPITAL_COMMUNITY): Payer: Medicare Other

## 2011-07-25 ENCOUNTER — Emergency Department (HOSPITAL_COMMUNITY)
Admission: EM | Admit: 2011-07-25 | Discharge: 2011-07-25 | Disposition: A | Discharge status: Home / Self Care | Payer: Medicare Other | Attending: Emergency Medicine | Admitting: Emergency Medicine

## 2011-07-25 ENCOUNTER — Emergency Department (HOSPITAL_COMMUNITY)
Admission: EM | Admit: 2011-07-25 | Discharge: 2011-07-26 | Disposition: A | Discharge status: Home / Self Care | Payer: Medicare Other | Attending: Emergency Medicine | Admitting: Emergency Medicine

## 2011-07-25 ENCOUNTER — Encounter (HOSPITAL_COMMUNITY): Payer: Self-pay

## 2011-07-25 ENCOUNTER — Encounter (HOSPITAL_COMMUNITY): Payer: Self-pay | Admitting: Emergency Medicine

## 2011-07-25 DIAGNOSIS — G8929 Other chronic pain: Secondary | ICD-10-CM | POA: Insufficient documentation

## 2011-07-25 DIAGNOSIS — M545 Low back pain, unspecified: Secondary | ICD-10-CM | POA: Insufficient documentation

## 2011-07-25 DIAGNOSIS — Z87891 Personal history of nicotine dependence: Secondary | ICD-10-CM | POA: Insufficient documentation

## 2011-07-25 DIAGNOSIS — M549 Dorsalgia, unspecified: Secondary | ICD-10-CM

## 2011-07-25 DIAGNOSIS — I10 Essential (primary) hypertension: Secondary | ICD-10-CM | POA: Insufficient documentation

## 2011-07-25 DIAGNOSIS — M79609 Pain in unspecified limb: Secondary | ICD-10-CM | POA: Insufficient documentation

## 2011-07-25 DIAGNOSIS — E785 Hyperlipidemia, unspecified: Secondary | ICD-10-CM | POA: Insufficient documentation

## 2011-07-25 DIAGNOSIS — Z79899 Other long term (current) drug therapy: Secondary | ICD-10-CM | POA: Insufficient documentation

## 2011-07-25 DIAGNOSIS — G40909 Epilepsy, unspecified, not intractable, without status epilepticus: Secondary | ICD-10-CM | POA: Insufficient documentation

## 2011-07-25 DIAGNOSIS — E119 Type 2 diabetes mellitus without complications: Secondary | ICD-10-CM | POA: Insufficient documentation

## 2011-07-25 DIAGNOSIS — F411 Generalized anxiety disorder: Secondary | ICD-10-CM | POA: Insufficient documentation

## 2011-07-25 DIAGNOSIS — Z9884 Bariatric surgery status: Secondary | ICD-10-CM | POA: Insufficient documentation

## 2011-07-25 DIAGNOSIS — F319 Bipolar disorder, unspecified: Secondary | ICD-10-CM | POA: Insufficient documentation

## 2011-07-25 DIAGNOSIS — F3130 Bipolar disorder, current episode depressed, mild or moderate severity, unspecified: Secondary | ICD-10-CM | POA: Insufficient documentation

## 2011-07-25 MED ORDER — KETOROLAC TROMETHAMINE 60 MG/2ML IM SOLN
60.0000 mg | Freq: Once | INTRAMUSCULAR | Status: AC
  Administered 2011-07-25: 60 mg via INTRAMUSCULAR
  Filled 2011-07-25: qty 2

## 2011-07-25 MED ORDER — OXYCODONE-ACETAMINOPHEN 5-325 MG PO TABS
2.0000 | ORAL_TABLET | Freq: Once | ORAL | Status: AC
  Administered 2011-07-26: 2 via ORAL

## 2011-07-25 MED ORDER — TRAMADOL HCL 50 MG PO TABS: 50.0000 mg | ORAL_TABLET | Freq: Four times a day (QID) | ORAL | Status: AC | PRN

## 2011-07-25 MED ORDER — IBUPROFEN 800 MG PO TABS: 800.0000 mg | ORAL_TABLET | Freq: Three times a day (TID) | ORAL | Status: AC

## 2011-07-25 MED ORDER — TRAMADOL HCL 50 MG PO TABS
50.0000 mg | ORAL_TABLET | Freq: Once | ORAL | Status: AC
  Administered 2011-07-25: 50 mg via ORAL
  Filled 2011-07-25: qty 1

## 2011-07-25 NOTE — ED Notes (Signed)
Pt. Having lower back pain, pain in her legs and joints and also pain on the bottom of her rt. Foot into her heel.  She reports having this pain for years, and was told when she had her lap-band procedure the pain would improve.  It has not.  Denies any injuries

## 2011-07-25 NOTE — ED Provider Notes (Signed)
Medical screening examination/treatment/procedure(s) were performed by non-physician practitioner and as supervising physician I was immediately available for consultation/collaboration.  Ashantae Pangallo, MD 07/25/11 1537 

## 2011-07-25 NOTE — Discharge Instructions (Signed)
FOLLOW UP WITH DR. Mikeal Hawthorne OR WITH A PRIMARY CARE PROVIDER OF YOUR CHOICE FOR FURTHER PAIN MANAGEMENT. RETURN HERE WITH ANY HIGH FEVER, NEW SYMPTOM OR NEW CONCERN.   Chronic Pain Management Managing chronic pain is not easy. The goal is to provide as much pain relief as possible. There are emotional as well as physical problems. Chronic pain may lead to symptoms of depression which magnify those of the pain. Problems may include:  Anxiety.   Sleep disturbances.   Confused thinking.   Feeling cranky.   Fatigue.   Weight gain or loss.  Identify the source of the pain first, if possible. The pain may be masking another problem. Try to find a pain management specialist or clinic. Work with a team to create a treatment plan for you. MEDICATIONS  May include narcotics or opioids. Larger than normal doses may be needed to control your pain.   Drugs for depression may help.   Over-the-counter medicines may help for some conditions. These drugs may be used along with others for better pain relief.   May be injected into sites such as the spine and joints. Injections may have to be repeated if they wear off.  THERAPY MAY INCLUDE:  Working with a physical therapist to keep from getting stiff.   Regular, gentle exercise.   Cognitive or behavioral therapy.   Using complementary or integrative medicine such as:   Acupuncture.   Massage, Reiki, or Rolfing.   Aroma, color, light, or sound therapy.   Group support.  FOR MORE INFORMATION [email or web address]. American Chronic Pain Association [email or web address]. Document Released: 05/07/2004 Document Revised: 03/19/2011 Document Reviewed: 06/16/2007 [facility] Patient Information 2012 [place], [place].

## 2011-07-25 NOTE — ED Provider Notes (Signed)
History     CSN: [id number]  Arrival date & time 07/25/11  4098   First MD Initiated Contact with Patient 07/25/11 [phone]      Chief Complaint  Patient presents with  . Back Pain    (Consider location/radiation/quality/duration/timing/severity/associated sxs/prior treatment) Patient is a 51 y.o. female presenting with back pain. The history is provided by the patient.  Back Pain  This is a chronic problem. Episode onset: Pain in back, legs (knees specifically) and feet for years. The problem occurs constantly. Associated with: She reports that she was told weight loss would ease her pain but it persists.  No new symptoms. No fever. Pertinent negatives include no chest pain, no fever, no abdominal pain and no dysuria.    Past Medical History  Diagnosis Date  . Bipolar 1 disorder   . Obesity   . Diabetes mellitus   . Hyperlipidemia   . Hypertension   . Vomiting   . Depression   . Anxiety   . Substance abuse     alcohol   . Seizures     as a teenager    Past Surgical History  Procedure Date  . Tonsillectomy   . Laparoscopic gastric banding 06/11/09    No family history on file.  History  Substance Use Topics  . Smoking status: Former Games developer  . Smokeless tobacco: Never Used  . Alcohol Use: Yes     ETOH (2) 24 oz 8% alcohol     OB History    Grav Para Term Preterm Abortions TAB SAB Ect Mult Living                  Review of Systems  Constitutional: Negative for fever and chills.  HENT: Negative.   Respiratory: Negative.   Cardiovascular: Negative.  Negative for chest pain and leg swelling.  Gastrointestinal: Negative.  Negative for abdominal pain.  Genitourinary: Negative for dysuria.  Musculoskeletal: Positive for back pain.  Skin: Negative.   Neurological: Negative.     Allergies  Review of patient's allergies indicates no known allergies.  Home Medications   Current Outpatient Rx  Name Route Sig Dispense Refill  . BUSPIRONE HCL 10 MG PO TABS Oral  Take 10 mg by mouth 3 (three) times daily.    Marland Kitchen CLONAZEPAM 0.5 MG PO TABS Oral Take 1 mg by mouth 2 (two) times daily as needed. For anxiety    . DICLOFENAC SODIUM 1 % TD GEL Topical Apply 1 application topically daily.    Marland Kitchen DIVALPROEX SODIUM 500 MG PO TBEC Oral Take 1 tablet (500 mg total) by mouth 3 (three) times daily. For mood stabilization and mania. 90 tablet 0  . LISINOPRIL-HYDROCHLOROTHIAZIDE 20-25 MG PO TABS Oral Take 1 tablet by mouth daily.     Marland Kitchen MAGNESIUM HYDROXIDE 400 MG/5ML PO SUSP Oral Take 15 mLs by mouth daily as needed. For constipation    . METFORMIN HCL 500 MG PO TABS  2 (two) times daily.     Marland Kitchen METOPROLOL TARTRATE 50 MG PO TABS  2 (two) times daily.     Marland Kitchen NORTREL 7/7/7 0.5/0.75/1-35 MG-MCG PO TABS  Daily.    Marland Kitchen MELATONIN PO Oral Take 1 capsule by mouth at bedtime as needed. For sleep    . OXYCODONE-ACETAMINOPHEN 5-325 MG PO TABS Oral Take 1 tablet by mouth every 4 (four) hours as needed. For pain    . PRAVASTATIN SODIUM 20 MG PO TABS Oral Take 20 mg by mouth daily.      Marland Kitchen  QUETIAPINE FUMARATE 100 MG PO TABS Oral Take 100-200 mg by mouth 3 (three) times daily. 1 in am and pm, 2 at bedtime    . ROPINIROLE HCL 1 MG PO TABS Oral Take 1 mg by mouth at bedtime.    . TRAMADOL HCL 50 MG PO TABS Oral Take 50 mg by mouth every 6 (six) hours as needed. For pain    . TRAZODONE HCL 150 MG PO TABS Oral Take 150 mg by mouth at bedtime as needed. Sleep    . ZOLPIDEM TARTRATE 10 MG PO TABS Oral Take 10 mg by mouth at bedtime as needed. For sleep    . LOPERAMIDE HCL 2 MG PO CAPS Oral Take 2 mg by mouth 4 (four) times daily as needed. For runny stools    . QUETIAPINE FUMARATE 200 MG PO TABS Oral Take 1 tablet (200 mg total) by mouth at bedtime. For mood stabilization and anxiety. 30 tablet 0  . QUETIAPINE FUMARATE 50 MG PO TABS Oral Take 50 mg by mouth 2 (two) times daily. In the morning and at lunch for anxiety and mood stability.      BP 112/92  Pulse 80  Temp(Src) 97.7 F (36.5 C) (Oral)   Resp 20  SpO2 100%  LMP 07/06/2011  Physical Exam  Constitutional: She appears well-developed and well-nourished.  HENT:  Head: Normocephalic.  Neck: Normal range of motion. Neck supple.  Cardiovascular: Normal rate and regular rhythm.   Pulmonary/Chest: Effort normal and breath sounds normal.  Abdominal: Soft. Bowel sounds are normal. There is no tenderness. There is no rebound and no guarding.  Musculoskeletal: Normal range of motion.       Mild lumbar and paralumbar tenderness without swelling or discoloration. Knees bilaterally unremarkable, no swelling, redness. Calves nontender. DP and PT pulses present and equal. Legs of equal temperature and size. Feet are without swelling, redness or point tenderness.  Neurological: She is alert. She has normal reflexes. No cranial nerve deficit. Coordination normal.  Skin: Skin is warm and dry. No rash noted.  Psychiatric: She has a normal mood and affect.    ED Course  Procedures (including critical care time)  Labs Reviewed - No data to display No results found.   No diagnosis found. 1. Chronic pain   MDM  Discussed that narcotics are not used in the emergency setting for chronic pain relief. Toradol and ultram offered and patient accepted. Will refer back to Dr. Mikeal Hawthorne for further management of chronic symptoms.        Rodena Medin, PA-C 07/25/11 1019

## 2011-07-25 NOTE — ED Provider Notes (Signed)
History     CSN: [id number]  Arrival date & time 07/25/11  2302   First MD Initiated Contact with Patient 07/25/11 2320      Chief Complaint  Patient presents with  . Back Pain  . Leg Pain    (Consider location/radiation/quality/duration/timing/severity/associated sxs/prior treatment) HPI Comments: Pt has chronic back pain, seen earlier in day for same pain - states that she was using the "Ab rocker" machine to exercise this evening and had back paina nd neck pain during the exercise - worse with moving and with bending.  Constant, moderate and not associated with red flags of back pain.  She has been able to ambulate though with back pain.  The pain was acute in onset, and is spasm in quality.  She has been using tramadol at home with minimal relief - she has fallen out of favor with her PMD and states she wants new PMD and pain clinic referral.  Review of MR shows no recent lumbar imaging.  Patient is a 51 y.o. female presenting with back pain and leg pain. The history is provided by the patient, a friend, medical records and the EMS personnel.  Back Pain  Associated symptoms include leg pain. Pertinent negatives include no fever, no numbness and no weakness.  Leg Pain  Pertinent negatives include no numbness.    Past Medical History  Diagnosis Date  . Bipolar 1 disorder   . Obesity   . Diabetes mellitus   . Hyperlipidemia   . Hypertension   . Vomiting   . Depression   . Anxiety   . Substance abuse     alcohol   . Seizures     as a teenager    Past Surgical History  Procedure Date  . Tonsillectomy   . Laparoscopic gastric banding 06/11/09    History reviewed. No pertinent family history.  History  Substance Use Topics  . Smoking status: Former Games developer  . Smokeless tobacco: Never Used  . Alcohol Use: Yes     ETOH (2) 24 oz 8% alcohol     OB History    Grav Para Term Preterm Abortions TAB SAB Ect Mult Living                  Review of Systems    Constitutional: Negative for fever and chills.  HENT: Negative for neck pain.   Cardiovascular: Negative for leg swelling.  Gastrointestinal: Negative for nausea and vomiting.       No incontinence of bowel  Genitourinary: Negative for difficulty urinating.       No incontinence or retention  Musculoskeletal: Positive for back pain.  Skin: Negative for rash.  Neurological: Negative for weakness and numbness.    Allergies  Review of patient's allergies indicates no known allergies.  Home Medications   Current Outpatient Rx  Name Route Sig Dispense Refill  . BUSPIRONE HCL 10 MG PO TABS Oral Take 10 mg by mouth 3 (three) times daily.    Marland Kitchen CLONAZEPAM 0.5 MG PO TABS Oral Take 1 mg by mouth 2 (two) times daily as needed. For anxiety    . DICLOFENAC SODIUM 1 % TD GEL Topical Apply 1 application topically daily.    Marland Kitchen DIVALPROEX SODIUM 500 MG PO TBEC Oral Take 1 tablet (500 mg total) by mouth 3 (three) times daily. For mood stabilization and mania. 90 tablet 0  . IBUPROFEN 800 MG PO TABS Oral Take 1 tablet (800 mg total) by mouth 3 (three) times daily.  21 tablet 0  . LISINOPRIL-HYDROCHLOROTHIAZIDE 20-25 MG PO TABS Oral Take 1 tablet by mouth daily.     Marland Kitchen LOPERAMIDE HCL 2 MG PO CAPS Oral Take 2 mg by mouth 4 (four) times daily as needed. For runny stools    . MAGNESIUM HYDROXIDE 400 MG/5ML PO SUSP Oral Take 15 mLs by mouth daily as needed. For constipation    . METFORMIN HCL 500 MG PO TABS  2 (two) times daily.     Marland Kitchen METOPROLOL TARTRATE 50 MG PO TABS  2 (two) times daily.     Marland Kitchen NORTREL 7/7/7 0.5/0.75/1-35 MG-MCG PO TABS  Daily.    Marland Kitchen MELATONIN PO Oral Take 1 capsule by mouth at bedtime as needed. For sleep    . OXYCODONE-ACETAMINOPHEN 5-325 MG PO TABS Oral Take 1 tablet by mouth every 4 (four) hours as needed. For pain    . PRAVASTATIN SODIUM 20 MG PO TABS Oral Take 20 mg by mouth daily.      . QUETIAPINE FUMARATE 100 MG PO TABS Oral Take 100-200 mg by mouth 3 (three) times daily. 1 in am and  pm, 2 at bedtime    . ROPINIROLE HCL 1 MG PO TABS Oral Take 1 mg by mouth at bedtime.    . TRAZODONE HCL 150 MG PO TABS Oral Take 150 mg by mouth at bedtime as needed. Sleep    . ZOLPIDEM TARTRATE 10 MG PO TABS Oral Take 10 mg by mouth at bedtime as needed. For sleep    . OXYCODONE-ACETAMINOPHEN 5-325 MG PO TABS Oral Take 1 tablet by mouth every 4 (four) hours as needed for pain. May take 2 tablets PO q 6 hours for severe pain - Do not take with Tylenol as this tablet already contains tylenol 15 tablet 0  . QUETIAPINE FUMARATE 200 MG PO TABS Oral Take 1 tablet (200 mg total) by mouth at bedtime. For mood stabilization and anxiety. 30 tablet 0  . QUETIAPINE FUMARATE 50 MG PO TABS Oral Take 50 mg by mouth 2 (two) times daily. In the morning and at lunch for anxiety and mood stability.    . TRAMADOL HCL 50 MG PO TABS Oral Take 1 tablet (50 mg total) by mouth every 6 (six) hours as needed for pain. 15 tablet 0    BP 97/70  Pulse 76  Temp(Src) 98.7 F (37.1 C) (Oral)  Resp 16  SpO2 99%  LMP 07/06/2011  Physical Exam  Constitutional: She appears well-developed and well-nourished. No distress.  HENT:  Head: Normocephalic.  Eyes: Conjunctivae are normal. No scleral icterus.  Cardiovascular: Normal rate and regular rhythm.   Pulmonary/Chest: Effort normal and breath sounds normal.  Musculoskeletal: Normal range of motion. She exhibits tenderness (ttp over the bialteral lower back, no midling ttp.). She exhibits no edema.  Neurological: She is alert. Coordination normal.       Sensation and motor intact - bilateral LE's normal in function, sensationa nd strength.  Skin: Skin is warm and dry. She is not diaphoretic.    ED Course  Procedures (including critical care time)  Labs Reviewed - No data to display Dg Lumbar Spine Complete  07/26/2011  *RADIOLOGY REPORT*  Clinical Data: Fall.  Back pain.  LUMBAR SPINE - COMPLETE 4+ VIEW  Comparison: None.  Findings: No evidence of acute fracture,  spondylolysis, or spondylolisthesis.  Mild degenerative disc disease is noted at L3-4 and L4-5.  No other significant bone abnormality identified.  IMPRESSION:  1.  No acute findings. 2.  Mild degenerative disc disease at L3-4 and L4-5.  Original Report Authenticated By: Danae Orleans, M.D.     1. Back pain       MDM  No red flags, pt is concerned that she doesn't have outlet when she has pain flares - will refer to other local PMD's and to pain clinics, plain films, percocet X 2.  Pain improved,xray without fracture, degen change, explained to pt.  Stable for d/c.        Vida Roller, MD 07/26/11 [id number]

## 2011-07-25 NOTE — ED Notes (Addendum)
Having back pain and bilat leg pain, was on a roller exercise mechine  And fell off, stated no new pain. Pt took indocin, Seroquel, tramadol, Depakote and Darvocet tonight.

## 2011-07-26 ENCOUNTER — Emergency Department (HOSPITAL_COMMUNITY): Payer: Medicare Other

## 2011-07-26 MED ORDER — OXYCODONE-ACETAMINOPHEN 5-325 MG PO TABS: 1.0000 | ORAL_TABLET | ORAL | Status: AC | PRN

## 2011-07-26 MED ORDER — OXYCODONE-ACETAMINOPHEN 5-325 MG PO TABS
ORAL_TABLET | ORAL | Status: AC
  Filled 2011-07-26: qty 2

## 2011-07-26 NOTE — Discharge Instructions (Signed)
Your back xray appears to have findings consistent with chronic back pain and arthritis - you should call some of the numbers below to establish care with a new doctor if you want to find another doctor.  Percocet is for severe pain if your other medicines are not working.  Your back pain should be treated with medicines such as ibuprofen or aleve and this back pain should get better over the next 2 weeks.  However if you develop severe or worsening pain, low back pain with fever, numbness, weakness or inability to walk or urinate, you should return to the ER immediately.  Please follow up with your doctor this week for a recheck if still having symptoms.   RESOURCE GUIDE  Dental Problems  Patients with Medicaid: [facility] [phone] [address]                                           [phone] [address] Phone:  [phone]                                                  Phone:  [phone]  If unable to pay or uninsured, contact:  [facility] or [facility]. to become qualified for the adult dental clinic.  Chronic Pain Problems Contact [facility]  [phone] Patients need to be referred by their primary care doctor.  Insufficient Money for Medicine Contact [employer]:  call "211" or [facility] [phone].  No Primary Care Doctor Call [facility]  [phone] Other agencies that provide inexpensive medical care    [facility]  [phone]    [facility]  [phone]    [facility]  [phone]    [facility]  [phone]    [facility]  [phone]    [facility]  [phone]  Psychological Services [facility]  [phone] [facility] Services  [phone] [facility]   [phone] (emergency services [phone])  Substance Abuse Resources [facility]  5752242099 [facility]  [phone] [facility] [phone] Floydene Flock [phone] Residential & Outpatient Substance Abuse Program  [phone]  Abuse/Neglect [facility] Child Abuse Hotline 641-449-0818 [name] Child Abuse Hotline [phone] (After Hours)  Emergency Shelter [facility] [phone]  Maternity Homes Room at the [place] [facility] [phone] [facility] 2035920244  MRSA Hotline #:   9794553606    [facility] Resources  [facility]     [employer]                          [facility] Dept. [address] [place]                       [address]      [address]  [address]  [place]ela Hua Phone:  [phone]                                   Phone:  [phone]                 Phone:  [place] Phone:  [place] [phone] (After Hours)

## 2011-07-27 ENCOUNTER — Other Ambulatory Visit (HOSPITAL_COMMUNITY): Payer: Medicare Other

## 2011-07-29 ENCOUNTER — Other Ambulatory Visit (HOSPITAL_COMMUNITY): Payer: Medicare Other

## 2011-07-31 ENCOUNTER — Other Ambulatory Visit (HOSPITAL_COMMUNITY): Payer: Medicare Other

## 2011-08-03 ENCOUNTER — Other Ambulatory Visit (HOSPITAL_COMMUNITY): Payer: Medicare Other

## 2011-08-05 ENCOUNTER — Other Ambulatory Visit (HOSPITAL_COMMUNITY): Payer: Medicare Other

## 2011-08-07 ENCOUNTER — Other Ambulatory Visit (HOSPITAL_COMMUNITY): Payer: Medicare Other

## 2011-08-10 ENCOUNTER — Other Ambulatory Visit (HOSPITAL_COMMUNITY): Payer: Medicare Other

## 2011-08-12 ENCOUNTER — Other Ambulatory Visit (HOSPITAL_COMMUNITY): Payer: Medicare Other | Attending: Physician Assistant

## 2011-08-14 ENCOUNTER — Other Ambulatory Visit (HOSPITAL_COMMUNITY): Payer: Medicare Other

## 2011-08-17 ENCOUNTER — Other Ambulatory Visit (HOSPITAL_COMMUNITY): Payer: Medicare Other

## 2011-08-19 ENCOUNTER — Other Ambulatory Visit (HOSPITAL_COMMUNITY): Payer: Medicare Other

## 2011-08-21 ENCOUNTER — Other Ambulatory Visit (HOSPITAL_COMMUNITY): Payer: Medicare Other

## 2011-08-24 ENCOUNTER — Other Ambulatory Visit (HOSPITAL_COMMUNITY): Payer: Medicare Other

## 2011-08-26 ENCOUNTER — Other Ambulatory Visit (HOSPITAL_COMMUNITY): Payer: Medicare Other

## 2011-09-03 ENCOUNTER — Encounter (INDEPENDENT_AMBULATORY_CARE_PROVIDER_SITE_OTHER): Payer: Medicare Other | Admitting: General Surgery

## 2011-09-04 ENCOUNTER — Encounter (INDEPENDENT_AMBULATORY_CARE_PROVIDER_SITE_OTHER): Payer: Medicare Other | Admitting: General Surgery

## 2011-09-21 ENCOUNTER — Emergency Department (INDEPENDENT_AMBULATORY_CARE_PROVIDER_SITE_OTHER): Payer: Medicare Other

## 2011-09-21 ENCOUNTER — Encounter (HOSPITAL_COMMUNITY): Payer: Self-pay | Admitting: Cardiology

## 2011-09-21 ENCOUNTER — Emergency Department (INDEPENDENT_AMBULATORY_CARE_PROVIDER_SITE_OTHER)
Admission: EM | Admit: 2011-09-21 | Discharge: 2011-09-21 | Disposition: A | Discharge status: Home / Self Care | Payer: Medicare Other | Source: Home / Self Care

## 2011-09-21 DIAGNOSIS — M7660 Achilles tendinitis, unspecified leg: Secondary | ICD-10-CM

## 2011-09-21 LAB — POCT PREGNANCY, URINE: Preg Test, Ur: NEGATIVE

## 2011-09-21 MED ORDER — HYDROCODONE-ACETAMINOPHEN 5-500 MG PO TABS: 1.0000 | ORAL_TABLET | Freq: Four times a day (QID) | ORAL | Status: AC | PRN

## 2011-09-21 NOTE — ED Provider Notes (Cosign Needed)
History     CSN: [id number]  Arrival date & time 09/21/11  1318   First MD Initiated Contact with Patient 09/21/11 1328      No chief complaint on file.   (Consider location/radiation/quality/duration/timing/severity/associated sxs/prior treatment) HPI Patient is 51 year old female who presents with progressively worsening left foot pain, initially started 2 weeks prior to this visit. She describes pain as sharp, intermittent, 10/10 in severity when present, aggravated by walking and putting weight on the left foot, no specific alleviating factors. Patient denies fevers and chills, denies specific focal neurologic weakness, denies other systemic symptoms. Patient also denies trauma to the foot. Patient denies similar episodes in the past. Patient is insisting on having urine tested for pregnancy as she believes she is pregnant. She reports she is still menstruating and is on birth control pills.  Past Medical History  Diagnosis Date  . Bipolar 1 disorder   . Obesity   . Diabetes mellitus   . Hyperlipidemia   . Hypertension   . Vomiting   . Depression   . Anxiety   . Substance abuse     alcohol   . Seizures     as a teenager    Past Surgical History  Procedure Date  . Tonsillectomy   . Laparoscopic gastric banding 06/11/09    No family history on file.  History  Substance Use Topics  . Smoking status: Former Games developer  . Smokeless tobacco: Never Used  . Alcohol Use: Yes     ETOH (2) 24 oz 8% alcohol     OB History    Grav Para Term Preterm Abortions TAB SAB Ect Mult Living                  Review of Systems  Constitutional: Denies fever, chills, diaphoresis, appetite change and fatigue.  HEENT: Denies photophobia, eye pain, redness, hearing loss, ear pain, congestion, sore throat, rhinorrhea, sneezing, mouth sores, trouble swallowing, neck pain, neck stiffness and tinnitus.   Respiratory: Denies SOB, DOE, cough, chest tightness,  and wheezing.   Cardiovascular:  Denies chest pain, palpitations and leg swelling.  Gastrointestinal: Denies nausea, vomiting, abdominal pain, diarrhea, constipation, blood in stool and abdominal distention.  Genitourinary: Denies dysuria, urgency, frequency, hematuria, flank pain and difficulty urinating.  Musculoskeletal: Denies myalgias, back pain, joint swelling, arthralgia.  Skin: Denies pallor, rash and wound.  Neurological: Denies dizziness, seizures, syncope, weakness, light-headedness, numbness and headaches.  Hematological: Denies adenopathy. Easy bruising, personal or family bleeding history  Psychiatric/Behavioral: Denies suicidal ideation, mood changes, confusion, nervousness, sleep disturbance and agitation   Allergies  Review of patient's allergies indicates no known allergies.  Home Medications   Current Outpatient Rx  Name Route Sig Dispense Refill  . BUSPIRONE HCL 10 MG PO TABS Oral Take 10 mg by mouth 3 (three) times daily.    Marland Kitchen CLONAZEPAM 0.5 MG PO TABS Oral Take 1 mg by mouth 2 (two) times daily as needed. For anxiety    . DICLOFENAC SODIUM 1 % TD GEL Topical Apply 1 application topically daily.    Marland Kitchen DIVALPROEX SODIUM 500 MG PO TBEC Oral Take 1 tablet (500 mg total) by mouth 3 (three) times daily. For mood stabilization and mania. 90 tablet 0  . LISINOPRIL-HYDROCHLOROTHIAZIDE 20-25 MG PO TABS Oral Take 1 tablet by mouth daily.     Marland Kitchen LOPERAMIDE HCL 2 MG PO CAPS Oral Take 2 mg by mouth 4 (four) times daily as needed. For runny stools    . MAGNESIUM HYDROXIDE  400 MG/5ML PO SUSP Oral Take 15 mLs by mouth daily as needed. For constipation    . METFORMIN HCL 500 MG PO TABS  2 (two) times daily.     Marland Kitchen METOPROLOL TARTRATE 50 MG PO TABS  2 (two) times daily.     Marland Kitchen NORTREL 7/7/7 0.5/0.75/1-35 MG-MCG PO TABS  Daily.    Marland Kitchen MELATONIN PO Oral Take 1 capsule by mouth at bedtime as needed. For sleep    . OXYCODONE-ACETAMINOPHEN 5-325 MG PO TABS Oral Take 1 tablet by mouth every 4 (four) hours as needed. For pain      . PRAVASTATIN SODIUM 20 MG PO TABS Oral Take 20 mg by mouth daily.      . QUETIAPINE FUMARATE 100 MG PO TABS Oral Take 100-200 mg by mouth 3 (three) times daily. 1 in am and pm, 2 at bedtime    . QUETIAPINE FUMARATE 200 MG PO TABS Oral Take 1 tablet (200 mg total) by mouth at bedtime. For mood stabilization and anxiety. 30 tablet 0  . QUETIAPINE FUMARATE 50 MG PO TABS Oral Take 50 mg by mouth 2 (two) times daily. In the morning and at lunch for anxiety and mood stability.    Marland Kitchen ROPINIROLE HCL 1 MG PO TABS Oral Take 1 mg by mouth at bedtime.    . TRAZODONE HCL 150 MG PO TABS Oral Take 150 mg by mouth at bedtime as needed. Sleep    . ZOLPIDEM TARTRATE 10 MG PO TABS Oral Take 10 mg by mouth at bedtime as needed. For sleep      BP 147/85  Pulse 69  Temp(Src) 97.9 F (36.6 C) (Oral)  Resp 18  SpO2 100%  Physical Exam  Constitutional: Vital signs reviewed.  Patient is a well-developed and well-nourished in no acute distress and cooperative with exam. Alert and oriented x3.  Cardiovascular: RRR, S1 normal, S2 normal, no MRG, pulses symmetric and intact bilaterally Pulmonary/Chest: CTAB, no wheezes, rales, or rhonchi Musculoskeletal: No joint deformities, erythema, or stiffness, ROM full and no nontender. Medial aspect of the left foot tender to palpation, Achilles tendon also tender to palpation, no erythema or swelling noted, normal range of motion in the left foot  Hematology: no cervical, inginal, or axillary adenopathy.  Neurological: A&O x3, Strenght is normal and symmetric bilaterally, cranial nerve II-XII are grossly intact, no focal motor deficit, sensory intact to light touch bilaterally.  Skin: Warm, dry and intact. No rash, cyanosis, or clubbing.   ED Course  Procedures (including critical care time)   Left foot XRAY IMPRESSION:  No acute findings. Calcaneal spur.  Achilles tendonitis and spur - Patient symptoms and physical exam findings consistent with Achilles tendinitis -  I will provide prescription for analgesia for adequate pain control - Patient was also advised to bear weight as tolerated and to apply ice to the area as needed - Patient was advised if symptoms do not improve or get worse over the next 1 to 2 weeks she needs to see primary care doctor   MDM  Left foot Achilles tendinitis and spur        Dorothea Ogle, MD 09/21/11 1545

## 2011-09-21 NOTE — ED Notes (Signed)
Pt reports pain to left heel that started 2 months ago. Denies injury. Pt also reports lower back pain and bilat knee pain that is ongoing. Pt has taken tramadol that takes the edge off but does not last long.

## 2011-09-21 NOTE — Discharge Instructions (Signed)
Achilles Tendinitis Tendinitis a swelling and soreness of the tendon. The pain in the tendon (cord-like structure which attaches muscle to bone) is produced by tiny tears and the inflammation present in that tendon. It commonly occurs at the shoulders, heels, and elbows. It is usually caused by overusing the tendon and joint involved. Achilles tendinitis involves the Achilles tendon. This is the large tendon in the back of the leg just above the foot. It attaches the large muscles of the lower leg to the heel bone (called calcaneus).  This diagnosis (learning what is wrong) is made by examination. X-rays will be generally be normal if only tendinitis is present. HOME CARE INSTRUCTIONS   Apply ice to the injury for 15 to 20 minutes, 3 to 4 times per day. Put the ice in a plastic bag and place a towel between the bag of ice and your skin.   Try to avoid use other than gentle range of motion while the tendon is painful. Do not resume use until instructed by your caregiver. Then begin use gradually. Do not increase use to the point of pain. If pain does develop, decrease use and continue the above measures. Gradually increase activities that do not cause discomfort until you gradually achieve normal use.   Only take over-the-counter or prescription medicines for pain, discomfort, or fever as directed by your caregiver.  SEEK MEDICAL CARE IF:   Your pain and swelling increase or pain is uncontrolled with medications.   You develop new, unexplained problems (symptoms) or an increase of the symptoms that brought you to your caregiver.   You develop an inability to move your toes or foot, develop warmth and swelling in your foot, or begin running an unexplained temperature.  MAKE SURE YOU:   Understand these instructions.   Will watch your condition.   Will get help right away if you are not doing well or get worse.  Document Released: 01/07/2005 Document Revised: 03/19/2011 Document Reviewed:  11/16/2007 Exit[employer] Patient Information 2012 [employer]. 

## 2011-10-03 ENCOUNTER — Emergency Department (INDEPENDENT_AMBULATORY_CARE_PROVIDER_SITE_OTHER)
Admission: EM | Admit: 2011-10-03 | Discharge: 2011-10-03 | Disposition: A | Discharge status: Home / Self Care | Payer: Self-pay | Source: Home / Self Care | Attending: Emergency Medicine | Admitting: Emergency Medicine

## 2011-10-03 ENCOUNTER — Encounter (HOSPITAL_COMMUNITY): Payer: Self-pay

## 2011-10-03 DIAGNOSIS — M7730 Calcaneal spur, unspecified foot: Secondary | ICD-10-CM

## 2011-10-03 DIAGNOSIS — M7732 Calcaneal spur, left foot: Secondary | ICD-10-CM

## 2011-10-03 MED ORDER — MELOXICAM 15 MG PO TABS: 15.0000 mg | ORAL_TABLET | Freq: Every day | ORAL | Status: DC

## 2011-10-03 NOTE — ED Notes (Signed)
Pt states she has lt heel spur pain history and bilateral knee pain and needs medication.  Pt was observed by me walking up the hill from the ED unassisted and when she go to our clinic she asked for w/c and states she is unable to ambulate.

## 2011-10-03 NOTE — Discharge Instructions (Signed)
Emergency care providers appreciate that many patients coming to [place] are in severe pain and we wish to address their pain in the safest, most responsible manner.  It is important to recognize however, that the proper treatment of chronic pain differs from that of the pain of injuries and acute illnesses.  Our goal is to provide quality, safe, personalized care and we thank you for giving [place] the opportunity to serve you. The use of narcotics and related agents for chronic pain syndromes may lead to additional physical and psychological problems.  Nearly as many people die from prescription narcotics each year as die from car crashes.  Additionally, this risk is increased if such prescriptions are obtained from a variety of sources.  Therefore, only your primary care physician or a pain management specialist is able to safely treat such syndromes with narcotic medications long-term.    Documentation revealing such prescriptions have been sought from multiple sources may prohibit [place] from providing a refill or different narcotic medication.  Your name may be checked first through the [facility] Controlled Substances Reporting System.  This database is a record of controlled substance medication prescriptions that the patient has received.  This has been established by [facility] in an effort to eliminate the dangerous, and often life threatening, practice of obtaining multiple prescriptions from different medical providers.   If you have a chronic pain syndrome (i.e. chronic headaches, recurrent back or neck pain, dental pain, abdominal or pelvis pain without a specific diagnosis, or neuropathic pain such as fibromyalgia) or recurrent visits for the same condition without an acute diagnosis, you may be treated with non-narcotics and other non-addictive medicines.  Allergic reactions or negative side effects that may be reported by a patient to such medications will not typically lead to the use of a narcotic  analgesic or other controlled substance as an alternative.   Patients managing chronic pain with a personal physician should have provisions in place for breakthrough pain.  If you are in crisis, you should call your physician.  If your physician directs you to the emergency department, please have the doctor call and speak to our attending physician concerning your care.   When patients come to the Emergency Department (ED) with acute medical conditions in which the Emergency Department physician feels appropriate to prescribe narcotic or sedating pain medication, the physician will prescribe these in very limited quantities.  The amount of these medications will last only until you can see your primary care physician in his/her office.  Any patient who returns to the ED seeking refills should expect only non-narcotic pain medications.   In the event of an acute medical condition exists and the emergency physician feels it is necessary that the patient be given a narcotic or sedating medication -  a responsible adult driver should be present in the room prior to the medication being given by the nurse.   Prescriptions for narcotic or sedating medications that have been lost, stolen or expired will not be refilled in the Emergency Department.    Patients who have chronic pain may receive non-narcotic prescriptions until seen by their primary care physician.  It is every patient's personal responsibility to maintain active prescriptions with his or her primary care physician or specialist.

## 2011-10-03 NOTE — ED Provider Notes (Signed)
History     CSN: [id number]  Arrival date & time 10/03/11  1725   First MD Initiated Contact with Patient 10/03/11 1726      Chief Complaint  Patient presents with  . Foot Pain  . Knee Pain    (Consider location/radiation/quality/duration/timing/severity/associated sxs/prior treatment) HPI Comments: Patient reports left heel/foot pain worse with walking starting several weeks ago. Symptoms are worse with walking, and for standing for prolonged periods of time. She states that she's been taking some "Vicodin or something" for her foot with significant improvement. Also states she's been icing it and taking Aleve with relief. She states that she is unable to take Tylenol or NSAIDs, due to her lap band. No recent or remote history of trauma to her foot. No paresthesias, color changes, swelling. [facility] ago with the same complaint.  Foot x-ray showed calcaneal spur, no acute findings. She was thought to have Achilles tendinitis and heel spur, and was sent home with short course of Percocet, told to followup with her primary care physician. She states that the pain is unchanged since then, and is requesting more pain medication.   Patient is a 51 y.o. female presenting with lower extremity pain. The history is provided by the patient and medical records. No language interpreter was used.  Foot Pain This is a chronic problem. The problem occurs constantly. The problem has not changed since onset.The symptoms are aggravated by walking. The symptoms are relieved by ice. Treatments tried: Percocet. The treatment provided moderate relief.    Past Medical History  Diagnosis Date  . Bipolar 1 disorder   . Obesity   . Diabetes mellitus   . Hyperlipidemia   . Hypertension   . Vomiting   . Depression   . Anxiety   . Substance abuse     alcohol   . Seizures     as a teenager    Past Surgical History  Procedure Date  . Tonsillectomy   .  Laparoscopic gastric banding 06/11/09  . Vaginal delivery 1987    History reviewed. No pertinent family history.  History  Substance Use Topics  . Smoking status: Former Games developer  . Smokeless tobacco: Never Used  . Alcohol Use: No    OB History    Grav Para Term Preterm Abortions TAB SAB Ect Mult Living                  Review of Systems  Allergies  Review of patient's allergies indicates no known allergies.  Home Medications   Current Outpatient Rx  Name Route Sig Dispense Refill  . BUSPIRONE HCL 10 MG PO TABS Oral Take 10 mg by mouth 3 (three) times daily.    Marland Kitchen CLONAZEPAM 0.5 MG PO TABS Oral Take 1 mg by mouth 2 (two) times daily as needed. For anxiety    . DIVALPROEX SODIUM 500 MG PO TBEC Oral Take 1 tablet (500 mg total) by mouth 3 (three) times daily. For mood stabilization and mania. 90 tablet 0  . LISINOPRIL-HYDROCHLOROTHIAZIDE 20-25 MG PO TABS Oral Take 1 tablet by mouth daily.     Marland Kitchen LOPERAMIDE HCL 2 MG PO CAPS Oral Take 2 mg by mouth 4 (four) times daily as needed. For runny stools    . MAGNESIUM HYDROXIDE 400 MG/5ML PO SUSP Oral Take 15 mLs by mouth daily as needed. For constipation    . MELOXICAM 15 MG PO TABS Oral Take 1 tablet (15  mg total) by mouth daily. 14 tablet 0  . METFORMIN HCL 500 MG PO TABS  2 (two) times daily.     Marland Kitchen METOPROLOL TARTRATE 50 MG PO TABS  2 (two) times daily.     Marland Kitchen NORTREL 7/7/7 0.5/0.75/1-35 MG-MCG PO TABS  Daily.    Marland Kitchen MELATONIN PO Oral Take 1 capsule by mouth at bedtime as needed. For sleep    . OXYCODONE-ACETAMINOPHEN 5-325 MG PO TABS Oral Take 1 tablet by mouth every 4 (four) hours as needed. For pain    . PRAVASTATIN SODIUM 20 MG PO TABS Oral Take 20 mg by mouth daily.      . QUETIAPINE FUMARATE 100 MG PO TABS Oral Take 100-200 mg by mouth 3 (three) times daily. 1 in am and pm, 2 at bedtime    . QUETIAPINE FUMARATE 200 MG PO TABS Oral Take 1 tablet (200 mg total) by mouth at bedtime. For mood stabilization and anxiety. 30 tablet 0  .  QUETIAPINE FUMARATE 50 MG PO TABS Oral Take 50 mg by mouth 2 (two) times daily. In the morning and at lunch for anxiety and mood stability.    Marland Kitchen ROPINIROLE HCL 1 MG PO TABS Oral Take 1 mg by mouth at bedtime.    . TRAZODONE HCL 150 MG PO TABS Oral Take 150 mg by mouth at bedtime as needed. Sleep    . ZIPRASIDONE HCL 20 MG PO CAPS Oral Take 20 mg by mouth daily. Pt unsure of dosage    . ZOLPIDEM TARTRATE 10 MG PO TABS Oral Take 10 mg by mouth at bedtime as needed. For sleep      BP 139/86  Pulse 68  Temp 97.8 F (36.6 C) (Oral)  Resp 17  SpO2 100%  LMP 08/12/2011  Physical Exam  Nursing note and vitals reviewed. Constitutional: She is oriented to person, place, and time. She appears well-developed and well-nourished. No distress.  HENT:  Head: Normocephalic and atraumatic.  Eyes: Conjunctivae and EOM are normal.  Neck: Normal range of motion.  Cardiovascular: Normal rate.   Pulmonary/Chest: Effort normal.  Abdominal: She exhibits no distension.  Musculoskeletal: Normal range of motion. She exhibits no edema.       Tenderness at the base the calcaneus. Left midfoot NT. Base of fifth metatarsal NT. No bruising. Skin intact. DP 2+. Refill less than 2 seconds. Sensation grossly intact. Patient able to move all toes actively.  no pain with inversion / eversion,  dorsiflexion / plantarflexion.  Distal fibula NT, Medial malleolus NT,  Deltoid ligament NT, Lateral ligaments NT, Achilles NT. Patient able to bear weight while in department.  Neurological: She is alert and oriented to person, place, and time.  Skin: Skin is warm and dry.  Psychiatric: She has a normal mood and affect. Her behavior is normal. Judgment and thought content normal.    ED Course  Procedures (including critical care time)  Labs Reviewed - No data to display No results found.   1. Calcaneal spur of left foot     MDM  Previous records reviewed, as noted in history of present illness. Patient has a history of  polysubstance abuse, it seems to be primarily with alcohol. She has been treated in the recent past for this.  No acute changes in her pain. No history of recent trauma. Will not repeat x-ray. Staff observed patient walking up the hill unassisted to the [facility].  [place] narcotic database reviewed. Patient filled a prescription of #30 Percocet  5/325  on 6/12 from her primary care physician, 2 days after she was sent home with a prescription for Percocet from the urgent care Center. Patient did not initially reveal this to me, although admitted it after I confronted her with this information. Told her that we will try Mobic, cool compresses, elevation, supportive shoes, but that I would not prescribe narcotics. She agrees with this.   Luiz Blare, MD 10/03/11 2158

## 2011-11-27 ENCOUNTER — Encounter (INDEPENDENT_AMBULATORY_CARE_PROVIDER_SITE_OTHER): Payer: Medicare Other | Admitting: General Surgery

## 2011-12-08 ENCOUNTER — Encounter (INDEPENDENT_AMBULATORY_CARE_PROVIDER_SITE_OTHER): Payer: Self-pay | Admitting: General Surgery

## 2011-12-17 ENCOUNTER — Emergency Department (HOSPITAL_BASED_OUTPATIENT_CLINIC_OR_DEPARTMENT_OTHER): Payer: Medicare Other

## 2011-12-17 ENCOUNTER — Encounter (HOSPITAL_BASED_OUTPATIENT_CLINIC_OR_DEPARTMENT_OTHER): Payer: Self-pay | Admitting: Emergency Medicine

## 2011-12-17 ENCOUNTER — Emergency Department (HOSPITAL_BASED_OUTPATIENT_CLINIC_OR_DEPARTMENT_OTHER)
Admission: EM | Admit: 2011-12-17 | Discharge: 2011-12-18 | Disposition: A | Discharge status: Home / Self Care | Payer: Medicare Other | Attending: Emergency Medicine | Admitting: Emergency Medicine

## 2011-12-17 DIAGNOSIS — E119 Type 2 diabetes mellitus without complications: Secondary | ICD-10-CM | POA: Insufficient documentation

## 2011-12-17 DIAGNOSIS — I10 Essential (primary) hypertension: Secondary | ICD-10-CM | POA: Insufficient documentation

## 2011-12-17 DIAGNOSIS — W19XXXA Unspecified fall, initial encounter: Secondary | ICD-10-CM

## 2011-12-17 DIAGNOSIS — R296 Repeated falls: Secondary | ICD-10-CM | POA: Insufficient documentation

## 2011-12-17 DIAGNOSIS — R0789 Other chest pain: Secondary | ICD-10-CM

## 2011-12-17 DIAGNOSIS — F411 Generalized anxiety disorder: Secondary | ICD-10-CM | POA: Insufficient documentation

## 2011-12-17 DIAGNOSIS — IMO0002 Reserved for concepts with insufficient information to code with codable children: Secondary | ICD-10-CM | POA: Insufficient documentation

## 2011-12-17 DIAGNOSIS — S0081XA Abrasion of other part of head, initial encounter: Secondary | ICD-10-CM

## 2011-12-17 DIAGNOSIS — R51 Headache: Secondary | ICD-10-CM | POA: Insufficient documentation

## 2011-12-17 DIAGNOSIS — M542 Cervicalgia: Secondary | ICD-10-CM | POA: Insufficient documentation

## 2011-12-17 DIAGNOSIS — E785 Hyperlipidemia, unspecified: Secondary | ICD-10-CM | POA: Insufficient documentation

## 2011-12-17 DIAGNOSIS — Z23 Encounter for immunization: Secondary | ICD-10-CM | POA: Insufficient documentation

## 2011-12-17 DIAGNOSIS — R079 Chest pain, unspecified: Secondary | ICD-10-CM | POA: Insufficient documentation

## 2011-12-17 LAB — ETHANOL: Alcohol, Ethyl (B): <11 mg/dL (ref 0–11)

## 2011-12-17 MED ORDER — TETANUS-DIPHTH-ACELL PERTUSSIS 5-2.5-18.5 LF-MCG/0.5 IM SUSP
0.5000 mL | Freq: Once | INTRAMUSCULAR | Status: AC
  Administered 2011-12-18: 0.5 mL via INTRAMUSCULAR
  Filled 2011-12-17: qty 0.5

## 2011-12-17 NOTE — ED Notes (Signed)
Called to room by pt's family. Pt remains on LSB but had removed C-collar, collar reapplied and explained to pt the importance of leaving collar in place. Pt cursing and arguing with family member.

## 2011-12-17 NOTE — ED Notes (Signed)
Pt reports at hotel about 4 hours ago, +etoh, fell face first into concrete,  No loc, c/o lumbar pain, denies neck pain, left sided flank pain,

## 2011-12-17 NOTE — ED Notes (Signed)
Patient transported to X-ray 

## 2011-12-17 NOTE — ED Notes (Signed)
Per patient, she was attempting to walk down a hill and fell, rolled down hill and hit her head on sidewalk

## 2011-12-18 MED ORDER — IBUPROFEN 800 MG PO TABS
800.0000 mg | ORAL_TABLET | Freq: Once | ORAL | Status: AC
  Administered 2011-12-18: 800 mg via ORAL
  Filled 2011-12-18: qty 1

## 2011-12-18 NOTE — ED Provider Notes (Signed)
History     CSN: [id number]  Arrival date & time 12/17/11  2233   First MD Initiated Contact with Patient 12/17/11 2327      Chief Complaint  Patient presents with  . Fall    (Consider location/radiation/quality/duration/timing/severity/associated sxs/prior treatment) HPI  Complaining of a fall 4 hours prior to admission. She was drinking alcohol. She fell face first in the concrete and rolled down in the basement. Is not clear she had a loss of consciousness. She has an abrasion to her face. She's complaining of pain side of her face and left rib cage. She has been able to worry since the event. She was brought in via EMS on a long spine board with a cervical collar in place. She complains of pain in her head and chest. The pain is moderate to severe. Not sure when she had her last tetanus shot.  Past Medical History  Diagnosis Date  . Bipolar 1 disorder   . Obesity   . Diabetes mellitus   . Hyperlipidemia   . Hypertension   . Vomiting   . Depression   . Anxiety   . Substance abuse     alcohol   . Seizures     as a teenager    Past Surgical History  Procedure Date  . Tonsillectomy   . Laparoscopic gastric banding 06/11/09  . Vaginal delivery 1987    History reviewed. No pertinent family history.  History  Substance Use Topics  . Smoking status: Former Games developer  . Smokeless tobacco: Never Used  . Alcohol Use: 3.0 oz/week    5 Cans of beer per week     5 40 oz today    OB History    Grav Para Term Preterm Abortions TAB SAB Ect Mult Living                  Review of Systems  Constitutional: Negative for fever, chills, activity change, appetite change and unexpected weight change.  HENT: Negative for sore throat, rhinorrhea, neck pain, neck stiffness and sinus pressure.   Eyes: Negative for visual disturbance.  Respiratory: Negative for cough and shortness of breath.   Cardiovascular: Negative for chest pain and leg swelling.  Gastrointestinal: Negative for  vomiting, abdominal pain, diarrhea and blood in stool.  Genitourinary: Negative for dysuria, urgency, frequency, vaginal discharge and difficulty urinating.  Musculoskeletal: Negative for myalgias, arthralgias and gait problem.  Skin: Negative for color change and rash.  Neurological: Negative for weakness, light-headedness and headaches.  Hematological: Does not bruise/bleed easily.  Psychiatric/Behavioral: Negative for dysphoric mood.    Allergies  Review of patient's allergies indicates no known allergies.  Home Medications   Current Outpatient Rx  Name Route Sig Dispense Refill  . BUSPIRONE HCL 10 MG PO TABS Oral Take 10 mg by mouth 3 (three) times daily.    Marland Kitchen CLONAZEPAM 0.5 MG PO TABS Oral Take 1 mg by mouth 2 (two) times daily as needed. For anxiety    . DIVALPROEX SODIUM 500 MG PO TBEC Oral Take 1 tablet (500 mg total) by mouth 3 (three) times daily. For mood stabilization and mania. 90 tablet 0  . LISINOPRIL-HYDROCHLOROTHIAZIDE 20-25 MG PO TABS Oral Take 1 tablet by mouth daily.     Marland Kitchen LOPERAMIDE HCL 2 MG PO CAPS Oral Take 2 mg by mouth 4 (four) times daily as needed. For runny stools    . MAGNESIUM HYDROXIDE 400 MG/5ML PO SUSP Oral Take 15 mLs by mouth daily as needed.  For constipation    . MELOXICAM 15 MG PO TABS Oral Take 1 tablet (15 mg total) by mouth daily. 14 tablet 0  . METFORMIN HCL 500 MG PO TABS  2 (two) times daily.     Marland Kitchen METOPROLOL TARTRATE 50 MG PO TABS  2 (two) times daily.     Marland Kitchen NORTREL 7/7/7 0.5/0.75/1-35 MG-MCG PO TABS  Daily.    Marland Kitchen MELATONIN PO Oral Take 1 capsule by mouth at bedtime as needed. For sleep    . OXYCODONE-ACETAMINOPHEN 5-325 MG PO TABS Oral Take 1 tablet by mouth every 4 (four) hours as needed. For pain    . PRAVASTATIN SODIUM 20 MG PO TABS Oral Take 20 mg by mouth daily.      . QUETIAPINE FUMARATE 100 MG PO TABS Oral Take 100-200 mg by mouth 3 (three) times daily. 1 in am and pm, 2 at bedtime    . QUETIAPINE FUMARATE 200 MG PO TABS Oral Take 1  tablet (200 mg total) by mouth at bedtime. For mood stabilization and anxiety. 30 tablet 0  . QUETIAPINE FUMARATE 50 MG PO TABS Oral Take 50 mg by mouth 2 (two) times daily. In the morning and at lunch for anxiety and mood stability.    Marland Kitchen ROPINIROLE HCL 1 MG PO TABS Oral Take 1 mg by mouth at bedtime.    . TRAZODONE HCL 150 MG PO TABS Oral Take 150 mg by mouth at bedtime as needed. Sleep    . ZIPRASIDONE HCL 20 MG PO CAPS Oral Take 20 mg by mouth daily. Pt unsure of dosage    . ZOLPIDEM TARTRATE 10 MG PO TABS Oral Take 10 mg by mouth at bedtime as needed. For sleep      BP 105/68  Pulse 78  Resp 18  SpO2 98%  Physical Exam  Nursing note and vitals reviewed. Constitutional: She appears well-developed and well-nourished.  HENT:  Head: Normocephalic.       Abrasion left 4 head and left cheek.  Eyes: Conjunctivae and EOM are normal. Pupils are equal, round, and reactive to light.  Neck: Normal range of motion. Neck supple.  Cardiovascular: Normal rate, regular rhythm, normal heart sounds and intact distal pulses.   Pulmonary/Chest: Effort normal and breath sounds normal. She exhibits tenderness.       Mild tenderness left posterior inferior chest wall.  Abdominal: Soft. Bowel sounds are normal. There is no tenderness.  Musculoskeletal: Normal range of motion.       Multiple different colored bruises on extremities none appear acute  Neurological: She is alert.  Skin: Skin is warm and dry.  Psychiatric: She has a normal mood and affect. Thought content normal.    ED Course  Procedures (including critical care time)   Labs Reviewed  ETHANOL   Dg Ribs Unilateral W/chest Left  12/18/2011  *RADIOLOGY REPORT*  Clinical Data: Left mid upper rib pain post fall  LEFT RIBS AND CHEST - 3+ VIEW  Comparison: Chest radiograph 12/28/2008  Findings: Upper-normal size of cardiac silhouette. Mediastinal contours and pulmonary vascularity normal. Lungs clear. No pleural effusion or pneumothorax. BB  placed at site of symptoms left chest. No rib fracture or bone destruction identified. Patient is post laparoscopic gastric band placement.  IMPRESSION: No acute left rib abnormalities.   Original Report Authenticated By: Lollie Marrow, M.D.    Ct Head Wo Contrast  12/18/2011  *RADIOLOGY REPORT*  Clinical Data:  Headache and neck pain status post fall.  CT HEAD WITHOUT  CONTRAST CT CERVICAL SPINE WITHOUT CONTRAST  Technique:  Multidetector CT imaging of the head and cervical spine was performed following the standard protocol without intravenous contrast.  Multiplanar CT image reconstructions of the cervical spine were also generated.  Comparison:   None  CT HEAD  Findings: There is no evidence of acute intracranial hemorrhage, mass lesion, brain edema or extra-axial fluid collection.  The ventricles and subarachnoid spaces are appropriately sized for age. There is no CT evidence of acute cortical infarction.  There is mucosal thickening in the right maxillary sinus which is relatively small, implying chronicity.  No air-fluid levels are identified.  The mastoids and middle ears are clear. The calvarium is intact.  IMPRESSION: No acute intracranial or calvarial findings.  Chronic right maxillary sinus mucosal thickening.  CT CERVICAL SPINE  Findings: The cervical alignment is normal.  There is no evidence of acute fracture or traumatic subluxation.  The disc spaces are preserved.  There is a sclerotic lesion within the right aspect of the C5 vertebral body which measures 10 mm in diameter.  There is a small focus of vacuum phenomenon within the superior aspect of this lesion.  No lytic lesion or paraspinal abnormality is seen.  IMPRESSION:  1.  No evidence of acute cervical spine fracture, traumatic subluxation or static signs of instability. 2.  Sclerotic lesion in the C5 vertebral body, likely an incidental finding such as a bone island or atypical hemangioma.   Original Report Authenticated By: Gerrianne Scale, M.D.    Ct Cervical Spine Wo Contrast  12/18/2011  *RADIOLOGY REPORT*  Clinical Data:  Headache and neck pain status post fall.  CT HEAD WITHOUT CONTRAST CT CERVICAL SPINE WITHOUT CONTRAST  Technique:  Multidetector CT imaging of the head and cervical spine was performed following the standard protocol without intravenous contrast.  Multiplanar CT image reconstructions of the cervical spine were also generated.  Comparison:   None  CT HEAD  Findings: There is no evidence of acute intracranial hemorrhage, mass lesion, brain edema or extra-axial fluid collection.  The ventricles and subarachnoid spaces are appropriately sized for age. There is no CT evidence of acute cortical infarction.  There is mucosal thickening in the right maxillary sinus which is relatively small, implying chronicity.  No air-fluid levels are identified.  The mastoids and middle ears are clear. The calvarium is intact.  IMPRESSION: No acute intracranial or calvarial findings.  Chronic right maxillary sinus mucosal thickening.  CT CERVICAL SPINE  Findings: The cervical alignment is normal.  There is no evidence of acute fracture or traumatic subluxation.  The disc spaces are preserved.  There is a sclerotic lesion within the right aspect of the C5 vertebral body which measures 10 mm in diameter.  There is a small focus of vacuum phenomenon within the superior aspect of this lesion.  No lytic lesion or paraspinal abnormality is seen.  IMPRESSION:  1.  No evidence of acute cervical spine fracture, traumatic subluxation or static signs of instability. 2.  Sclerotic lesion in the C5 vertebral body, likely an incidental finding such as a bone island or atypical hemangioma.   Original Report Authenticated By: Gerrianne Scale, M.D.      No diagnosis found.    He should receive tetanus shot here. She is up and ambulated. She's received ibuprofen for pain.       Hilario Quarry, MD 12/18/11 [phone]

## 2011-12-18 NOTE — ED Notes (Signed)
Pt and friend given cab voucher for transport home

## 2011-12-23 ENCOUNTER — Ambulatory Visit
Admission: RE | Admit: 2011-12-23 | Discharge: 2011-12-23 | Disposition: A | Discharge status: Home / Self Care | Payer: Medicare Other | Source: Ambulatory Visit | Attending: Nurse Practitioner | Admitting: Nurse Practitioner

## 2011-12-23 ENCOUNTER — Other Ambulatory Visit: Payer: Self-pay | Admitting: Nurse Practitioner

## 2011-12-23 DIAGNOSIS — G8929 Other chronic pain: Secondary | ICD-10-CM

## 2011-12-23 DIAGNOSIS — M545 Low back pain: Secondary | ICD-10-CM

## 2012-01-04 ENCOUNTER — Ambulatory Visit (INDEPENDENT_AMBULATORY_CARE_PROVIDER_SITE_OTHER): Payer: Medicare Other | Admitting: Family Medicine

## 2012-01-04 VITALS — BP 142/84 | HR 97 | Temp 97.9°F | Resp 18 | Ht 61.75 in | Wt 182.0 lb

## 2012-01-04 DIAGNOSIS — Z765 Malingerer [conscious simulation]: Secondary | ICD-10-CM

## 2012-01-04 DIAGNOSIS — R059 Cough, unspecified: Secondary | ICD-10-CM

## 2012-01-04 DIAGNOSIS — F319 Bipolar disorder, unspecified: Secondary | ICD-10-CM

## 2012-01-04 DIAGNOSIS — F1910 Other psychoactive substance abuse, uncomplicated: Secondary | ICD-10-CM

## 2012-01-04 DIAGNOSIS — R05 Cough: Secondary | ICD-10-CM

## 2012-01-04 DIAGNOSIS — M549 Dorsalgia, unspecified: Secondary | ICD-10-CM

## 2012-01-04 MED ORDER — MOMETASONE FURO-FORMOTEROL FUM 200-5 MCG/ACT IN AERO: 2.0000 | INHALATION_SPRAY | Freq: Two times a day (BID) | RESPIRATORY_TRACT | Status: DC

## 2012-01-04 NOTE — Progress Notes (Signed)
51 yo unemployed bipolar woman who is under stress with relationship with room mate and boyfriend.  She insists she is not homeless, though she is carrying a backpack with toilet paper, hair dryer and myriad other household items.  She complains of one week of cough, occasionally producing scant amounts of green phlegm, and right sided back pain which she attributes to the backpack.  She would like cough medicine and notes she usually gets Percocet for the back pain.  Objective:  NAD HEENT:  Unremarkable Chest:  Clear Heart:  Regular without murmur Back:  Normal contour, no ecchymosis, full range of motion as she hops up on exam table Skin:  Nor rash, ecchymosis  Chart reviewed in EPIC, multiple narcotic Rx's written, polysubstance abuse noted  Assessment:  Drug seeking behaviour.  Suggest patient reduce backpack load and try inhaler for now. 1. Cough  Mometasone Furo-Formoterol Fum 200-5 MCG/ACT AERO

## 2012-01-04 NOTE — Patient Instructions (Addendum)
To lessen the back pain, try not carrying such a heavy backpack with toilet paper, hair dryer, etc with you.   Cough, Adult  A cough is a reflex that helps clear your throat and airways. It can help heal the body or may be a reaction to an irritated airway. A cough may only last 2 or 3 weeks (acute) or may last more than 8 weeks (chronic).  CAUSES Acute cough:  Viral or bacterial infections.  Chronic cough:  Infections.   Allergies.   Asthma.   Post-nasal drip.   Smoking.   Heartburn or acid reflux.   Some medicines.   Chronic lung problems (COPD).   Cancer.  SYMPTOMS   Cough.   Fever.   Chest pain.   Increased breathing rate.   High-pitched whistling sound when breathing (wheezing).   Colored mucus that you cough up (sputum).  TREATMENT   A bacterial cough may be treated with antibiotic medicine.   A viral cough must run its course and will not respond to antibiotics.   Your caregiver may recommend other treatments if you have a chronic cough.  HOME CARE INSTRUCTIONS   Only take over-the-counter or prescription medicines for pain, discomfort, or fever as directed by your caregiver. Use cough suppressants only as directed by your caregiver.   Use a cold steam vaporizer or humidifier in your bedroom or home to help loosen secretions.   Sleep in a semi-upright position if your cough is worse at night.   Rest as needed.   Stop smoking if you smoke.  SEEK IMMEDIATE MEDICAL CARE IF:   You have pus in your sputum.   Your cough starts to worsen.   You cannot control your cough with suppressants and are losing sleep.   You begin coughing up blood.   You have difficulty breathing.   You develop pain which is getting worse or is uncontrolled with medicine.   You have a fever.  MAKE SURE YOU:   Understand these instructions.   Will watch your condition.   Will get help right away if you are not doing well or get worse.  Document Released:  09/26/2010 Document Revised: 03/19/2011 Document Reviewed: 09/26/2010 [facility] Patient Information 2012 [place], [place].

## 2012-01-23 ENCOUNTER — Encounter (HOSPITAL_COMMUNITY): Payer: Self-pay | Admitting: Emergency Medicine

## 2012-01-23 ENCOUNTER — Emergency Department (HOSPITAL_COMMUNITY): Payer: Medicare Other

## 2012-01-23 ENCOUNTER — Emergency Department (HOSPITAL_COMMUNITY)
Admission: EM | Admit: 2012-01-23 | Discharge: 2012-01-23 | Disposition: A | Discharge status: Home / Self Care | Payer: Medicare Other | Attending: Emergency Medicine | Admitting: Emergency Medicine

## 2012-01-23 DIAGNOSIS — W101XXA Fall (on)(from) sidewalk curb, initial encounter: Secondary | ICD-10-CM | POA: Insufficient documentation

## 2012-01-23 DIAGNOSIS — S4992XA Unspecified injury of left shoulder and upper arm, initial encounter: Secondary | ICD-10-CM

## 2012-01-23 DIAGNOSIS — E785 Hyperlipidemia, unspecified: Secondary | ICD-10-CM | POA: Insufficient documentation

## 2012-01-23 DIAGNOSIS — S4980XA Other specified injuries of shoulder and upper arm, unspecified arm, initial encounter: Secondary | ICD-10-CM | POA: Insufficient documentation

## 2012-01-23 DIAGNOSIS — S46909A Unspecified injury of unspecified muscle, fascia and tendon at shoulder and upper arm level, unspecified arm, initial encounter: Secondary | ICD-10-CM | POA: Insufficient documentation

## 2012-01-23 DIAGNOSIS — F411 Generalized anxiety disorder: Secondary | ICD-10-CM | POA: Insufficient documentation

## 2012-01-23 DIAGNOSIS — E669 Obesity, unspecified: Secondary | ICD-10-CM | POA: Insufficient documentation

## 2012-01-23 DIAGNOSIS — E119 Type 2 diabetes mellitus without complications: Secondary | ICD-10-CM | POA: Insufficient documentation

## 2012-01-23 DIAGNOSIS — Z87891 Personal history of nicotine dependence: Secondary | ICD-10-CM | POA: Insufficient documentation

## 2012-01-23 DIAGNOSIS — F319 Bipolar disorder, unspecified: Secondary | ICD-10-CM | POA: Insufficient documentation

## 2012-01-23 DIAGNOSIS — F1010 Alcohol abuse, uncomplicated: Secondary | ICD-10-CM | POA: Insufficient documentation

## 2012-01-23 DIAGNOSIS — Y9301 Activity, walking, marching and hiking: Secondary | ICD-10-CM | POA: Insufficient documentation

## 2012-01-23 DIAGNOSIS — I10 Essential (primary) hypertension: Secondary | ICD-10-CM | POA: Insufficient documentation

## 2012-01-23 NOTE — ED Notes (Addendum)
Pt has not returned

## 2012-01-23 NOTE — ED Notes (Signed)
PA in to see

## 2012-01-23 NOTE — ED Notes (Signed)
Pt has not returned, unable to locate in w. room

## 2012-01-23 NOTE — ED Provider Notes (Signed)
History     CSN: [id number]  Arrival date & time 01/23/12  1478   First MD Initiated Contact with Patient 01/23/12 1059      Chief Complaint  Patient presents with  . Shoulder Pain    (Consider location/radiation/quality/duration/timing/severity/associated sxs/prior treatment) HPI  51 year old female with history of alcohol abuse and hx of seizure presents c/o L shoulder pain.  Pt report she was walking with her boyfriend outside at night and she accidentally stepped on uneven sidewalk, fell and landed on her L shoulder.  Pt report acute onset, sharp, stabbing pain radiates down to arm and across back.  Pain worsening when she raises her arm above shoulder, improves with rest.  No numbness.  Denies hitting head or LOC.  No other complaints.  Does admits to drinking 1 alcohol beverage last night.  Has not tried any thing to alleviate sxs.    Past Medical History  Diagnosis Date  . Bipolar 1 disorder   . Obesity   . Diabetes mellitus   . Hyperlipidemia   . Hypertension   . Vomiting   . Depression   . Anxiety   . Substance abuse     alcohol   . Seizures     as a teenager    Past Surgical History  Procedure Date  . Tonsillectomy   . Laparoscopic gastric banding 06/11/09  . Vaginal delivery 1987    History reviewed. No pertinent family history.  History  Substance Use Topics  . Smoking status: Former Games developer  . Smokeless tobacco: Never Used  . Alcohol Use: 3.0 oz/week    5 Cans of beer per week     5 40 oz today    OB History    Grav Para Term Preterm Abortions TAB SAB Ect Mult Living                  Review of Systems  Constitutional: Negative for fever.  Musculoskeletal: Negative for back pain.  Skin: Negative for wound.  Neurological: Negative for numbness.    Allergies  Review of patient's allergies indicates no known allergies.  Home Medications   Current Outpatient Rx  Name Route Sig Dispense Refill  . AMLODIPINE BESYLATE 5 MG PO TABS Oral Take  5 mg by mouth daily.    . BUSPIRONE HCL 10 MG PO TABS Oral Take 10 mg by mouth 2 (two) times daily.     Marland Kitchen DIVALPROEX SODIUM 500 MG PO TBEC Oral Take 1 tablet (500 mg total) by mouth 3 (three) times daily. For mood stabilization and mania. 90 tablet 0  . LOPERAMIDE HCL 2 MG PO CAPS Oral Take 2 mg by mouth 4 (four) times daily as needed. For runny stools    . MELOXICAM 15 MG PO TABS Oral Take 15 mg by mouth daily.    Marland Kitchen METFORMIN HCL 500 MG PO TABS  500-1,000 mg 2 (two) times daily. Take 1 tab in am and 2 tabs to equal 1000mg  at night    . METOPROLOL TARTRATE 50 MG PO TABS  2 (two) times daily.     Marland Kitchen NORTREL 7/7/7 0.5/0.75/1-35 MG-MCG PO TABS  Daily.    Marland Kitchen PRAVASTATIN SODIUM 20 MG PO TABS Oral Take 20 mg by mouth daily.      . QUETIAPINE FUMARATE 100 MG PO TABS Oral Take 100-200 mg by mouth 3 (three) times daily. 1 in am and pm, 2 at bedtime    . TRAMADOL HCL 50 MG PO TABS Oral Take 50  mg by mouth every 6 (six) hours as needed. Pain    . TRAZODONE HCL 150 MG PO TABS Oral Take 150 mg by mouth at bedtime as needed. Sleep    . ZOLPIDEM TARTRATE 10 MG PO TABS Oral Take 10 mg by mouth at bedtime as needed. For sleep    . QUETIAPINE FUMARATE 200 MG PO TABS Oral Take 1 tablet (200 mg total) by mouth at bedtime. For mood stabilization and anxiety. 30 tablet 0  . QUETIAPINE FUMARATE 50 MG PO TABS Oral Take 50 mg by mouth 2 (two) times daily. In the morning and at lunch for anxiety and mood stability.      BP 136/82  Pulse 85  Temp 98.4 F (36.9 C) (Oral)  Resp 16  SpO2 99%  LMP 01/13/2012  Physical Exam  Nursing note and vitals reviewed. Constitutional: She appears well-developed and well-nourished. No distress.  HENT:  Head: Normocephalic and atraumatic.  Eyes: Conjunctivae normal are normal.  Neck: Normal range of motion. Neck supple.  Cardiovascular:  Pulses:      Radial pulses are 2+ on the right side, and 2+ on the left side.  Musculoskeletal: She exhibits tenderness (point tenderness to  posterior L shoulder.  increase pain with arm raise, external rotation, and with ab/adduction.  ).       Right shoulder: Normal.       Left shoulder: She exhibits decreased range of motion, tenderness, bony tenderness and pain. She exhibits no swelling, no effusion, no crepitus, no deformity and no laceration.       Left elbow: Normal.       Left wrist: Normal.  Neurological: She is alert.  Skin: Skin is warm. No rash noted.  Psychiatric: She has a normal mood and affect.    ED Course  Procedures (including critical care time)  Labs Reviewed - No data to display Dg Shoulder Left  01/23/2012  *RADIOLOGY REPORT*  Clinical Data: Left-sided shoulder pain following traumatic injury  LEFT SHOULDER - 2+ VIEW  Comparison: None.  Findings: Degenerative changes of the acromioclavicular joint are noted.  No acute fracture or dislocation is seen.  The underlying bony thorax is intact.  IMPRESSION: Degenerative change without acute abnormality.   Original Report Authenticated By: Phillips Odor, M.D.      No diagnosis found.  1. L shoulder injury.  MDM  L shoulder injury from mechanical fall.  No obvious overlying changes or deformity noted.  Xray ordered  11:37 AM Xray unremarkable.  Sling provided.  Pt request percocet, however i do not think narcotic pain meds is appropriate.  Recommend RICE.    BP 136/82  Pulse 85  Temp 98.4 F (36.9 C) (Oral)  Resp 16  SpO2 99%  LMP 01/13/2012  Nursing notes reviewed and considered in documentation  Previous records reviewed and considered  All labs/vitals reviewed and considered  xrays reviewed and considered       Fayrene Helper, PA-C 01/23/12 1137

## 2012-01-23 NOTE — ED Notes (Addendum)
Son at bedside, procedures explained, sling to be applied prior to dc, pt aware, reports ice pk did help some

## 2012-01-23 NOTE — ED Notes (Signed)
Pt left w/o dc instructions and sling

## 2012-01-23 NOTE — ED Notes (Signed)
Pt went outside to the [employer] to get a snack per visitor

## 2012-01-23 NOTE — ED Provider Notes (Signed)
Medical screening examination/treatment/procedure(s) were performed by non-physician practitioner and as supervising physician I was immediately available for consultation/collaboration.  Concha Sudol T Naima Veldhuizen, MD 01/23/12 1318 

## 2012-01-23 NOTE — ED Notes (Signed)
Unable to locate patient.

## 2012-01-23 NOTE — ED Notes (Signed)
Reports walking around neighborhood last night fell due to uneven sidewalk. Fell on left shoulder pain scale 10/10 now 8/10 treated self with cream. Pain worse with moving. Pain prevented patient from getting sleep. Told by boyfriend to get an xray.

## 2012-01-23 NOTE — ED Notes (Signed)
CBG 155 per EMS report

## 2012-01-23 NOTE — ED Notes (Signed)
Ginger ale, crackers given ice pack to lt shoulder.  Pt positioned for comfort

## 2012-03-15 ENCOUNTER — Observation Stay (HOSPITAL_COMMUNITY): Payer: Medicare Other

## 2012-03-15 ENCOUNTER — Encounter (HOSPITAL_COMMUNITY): Payer: Self-pay | Admitting: Emergency Medicine

## 2012-03-15 ENCOUNTER — Observation Stay (HOSPITAL_COMMUNITY)
Admission: EM | Admit: 2012-03-15 | Discharge: 2012-03-16 | Disposition: A | Discharge status: Home / Self Care | Payer: Medicare Other | Attending: Internal Medicine | Admitting: Internal Medicine

## 2012-03-15 DIAGNOSIS — T50901A Poisoning by unspecified drugs, medicaments and biological substances, accidental (unintentional), initial encounter: Secondary | ICD-10-CM

## 2012-03-15 DIAGNOSIS — F10229 Alcohol dependence with intoxication, unspecified: Principal | ICD-10-CM | POA: Insufficient documentation

## 2012-03-15 DIAGNOSIS — E119 Type 2 diabetes mellitus without complications: Secondary | ICD-10-CM

## 2012-03-15 DIAGNOSIS — I440 Atrioventricular block, first degree: Secondary | ICD-10-CM | POA: Insufficient documentation

## 2012-03-15 DIAGNOSIS — F329 Major depressive disorder, single episode, unspecified: Secondary | ICD-10-CM

## 2012-03-15 DIAGNOSIS — M25519 Pain in unspecified shoulder: Secondary | ICD-10-CM | POA: Insufficient documentation

## 2012-03-15 DIAGNOSIS — R4182 Altered mental status, unspecified: Secondary | ICD-10-CM

## 2012-03-15 DIAGNOSIS — M545 Low back pain, unspecified: Secondary | ICD-10-CM | POA: Diagnosis present

## 2012-03-15 DIAGNOSIS — I10 Essential (primary) hypertension: Secondary | ICD-10-CM | POA: Diagnosis present

## 2012-03-15 DIAGNOSIS — F419 Anxiety disorder, unspecified: Secondary | ICD-10-CM

## 2012-03-15 DIAGNOSIS — F1020 Alcohol dependence, uncomplicated: Secondary | ICD-10-CM

## 2012-03-15 DIAGNOSIS — F319 Bipolar disorder, unspecified: Secondary | ICD-10-CM | POA: Diagnosis present

## 2012-03-15 DIAGNOSIS — F411 Generalized anxiety disorder: Secondary | ICD-10-CM

## 2012-03-15 DIAGNOSIS — IMO0001 Reserved for inherently not codable concepts without codable children: Secondary | ICD-10-CM | POA: Diagnosis present

## 2012-03-15 DIAGNOSIS — F10929 Alcohol use, unspecified with intoxication, unspecified: Secondary | ICD-10-CM | POA: Diagnosis present

## 2012-03-15 DIAGNOSIS — Z79899 Other long term (current) drug therapy: Secondary | ICD-10-CM | POA: Insufficient documentation

## 2012-03-15 DIAGNOSIS — F1910 Other psychoactive substance abuse, uncomplicated: Secondary | ICD-10-CM | POA: Diagnosis present

## 2012-03-15 DIAGNOSIS — R45850 Homicidal ideations: Secondary | ICD-10-CM

## 2012-03-15 DIAGNOSIS — D649 Anemia, unspecified: Secondary | ICD-10-CM

## 2012-03-15 DIAGNOSIS — Z23 Encounter for immunization: Secondary | ICD-10-CM | POA: Insufficient documentation

## 2012-03-15 DIAGNOSIS — E876 Hypokalemia: Secondary | ICD-10-CM | POA: Insufficient documentation

## 2012-03-15 LAB — RAPID URINE DRUG SCREEN, HOSP PERFORMED
Amphetamines: POSITIVE — AB
Barbiturates: NOT DETECTED
Benzodiazepines: NOT DETECTED
Tetrahydrocannabinol: POSITIVE — AB

## 2012-03-15 LAB — COMPREHENSIVE METABOLIC PANEL
ALT: 9 U/L (ref 0–35)
AST: 14 U/L (ref 0–37)
AST: 21 U/L (ref 0–37)
Albumin: 2.6 g/dL — ABNORMAL LOW (ref 3.5–5.2)
Alkaline Phosphatase: 46 U/L (ref 39–117)
CO2: 24 mEq/L (ref 19–32)
CO2: 27 mEq/L (ref 19–32)
Calcium: 8.6 mg/dL (ref 8.4–10.5)
Chloride: 98 mEq/L (ref 96–112)
Chloride: 105 mEq/L (ref 96–112)
Creatinine, Ser: 1.02 mg/dL (ref 0.50–1.10)
Creatinine, Ser: 1.1 mg/dL (ref 0.50–1.10)
GFR calc Af Amer: 66 mL/min — ABNORMAL LOW (ref >90)
GFR calc non Af Amer: 63 mL/min — ABNORMAL LOW (ref >90)
GFR calc non Af Amer: 57 mL/min — ABNORMAL LOW (ref >90)
Glucose, Bld: 144 mg/dL — ABNORMAL HIGH (ref 70–99)
Potassium: 3.3 mEq/L — ABNORMAL LOW (ref 3.5–5.1)
Total Bilirubin: 0.2 mg/dL — ABNORMAL LOW (ref 0.3–1.2)
Total Bilirubin: 0.4 mg/dL (ref 0.3–1.2)

## 2012-03-15 LAB — CBC WITH DIFFERENTIAL/PLATELET
Basophils Absolute: 0 10*3/uL (ref 0.0–0.1)
Basophils Relative: 0 % (ref 0–1)
HCT: 32.3 % — ABNORMAL LOW (ref 36.0–46.0)
Hemoglobin: 10.8 g/dL — ABNORMAL LOW (ref 12.0–15.0)
Lymphocytes Relative: 55 % — ABNORMAL HIGH (ref 12–46)
Monocytes Absolute: 0.5 10*3/uL (ref 0.1–1.0)
Monocytes Relative: 7 % (ref 3–12)
Neutro Abs: 2.6 10*3/uL (ref 1.7–7.7)
Neutrophils Relative %: 37 % — ABNORMAL LOW (ref 43–77)
RDW: 13.6 % (ref 11.5–15.5)
WBC: 7 10*3/uL (ref 4.0–10.5)

## 2012-03-15 LAB — URINALYSIS, ROUTINE W REFLEX MICROSCOPIC
Bilirubin Urine: NEGATIVE
Glucose, UA: NEGATIVE mg/dL
Ketones, ur: NEGATIVE mg/dL
Protein, ur: NEGATIVE mg/dL
pH: 6 (ref 5.0–8.0)

## 2012-03-15 LAB — MAGNESIUM: Magnesium: 1.6 mg/dL (ref 1.5–2.5)

## 2012-03-15 LAB — VALPROIC ACID LEVEL: Valproic Acid Lvl: 32.6 ug/mL — ABNORMAL LOW (ref 50.0–100.0)

## 2012-03-15 LAB — ETHANOL: Alcohol, Ethyl (B): 172 mg/dL — ABNORMAL HIGH (ref 0–11)

## 2012-03-15 LAB — URINE MICROSCOPIC-ADD ON

## 2012-03-15 LAB — GLUCOSE, CAPILLARY

## 2012-03-15 LAB — ACETAMINOPHEN LEVEL: Acetaminophen (Tylenol), Serum: <15 ug/mL (ref 10–30)

## 2012-03-15 LAB — PREGNANCY, URINE: Preg Test, Ur: NEGATIVE

## 2012-03-15 MED ORDER — TRAZODONE HCL 150 MG PO TABS
150.0000 mg | ORAL_TABLET | Freq: Every evening | ORAL | Status: DC | PRN
  Filled 2012-03-15: qty 1

## 2012-03-15 MED ORDER — LORAZEPAM 1 MG PO TABS: 1.0000 mg | ORAL_TABLET | Freq: Four times a day (QID) | ORAL | Status: DC | PRN

## 2012-03-15 MED ORDER — HYDROCODONE-ACETAMINOPHEN 5-325 MG PO TABS
1.0000 | ORAL_TABLET | ORAL | Status: DC | PRN
  Administered 2012-03-15: 1 via ORAL
  Administered 2012-03-16: 2 via ORAL
  Filled 2012-03-15: qty 1
  Filled 2012-03-15: qty 2

## 2012-03-15 MED ORDER — SODIUM CHLORIDE 0.9 % IV SOLN
INTRAVENOUS | Status: DC
  Administered 2012-03-15: 75 mL via INTRAVENOUS
  Administered 2012-03-16: 06:00:00 via INTRAVENOUS

## 2012-03-15 MED ORDER — SODIUM CHLORIDE 0.9 % IV SOLN
INTRAVENOUS | Status: AC
  Administered 2012-03-15: 100 mL/h via INTRAVENOUS

## 2012-03-15 MED ORDER — METFORMIN HCL 500 MG PO TABS
500.0000 mg | ORAL_TABLET | Freq: Every day | ORAL | Status: DC
  Administered 2012-03-16: 500 mg via ORAL
  Filled 2012-03-15 (×2): qty 1

## 2012-03-15 MED ORDER — LORAZEPAM 2 MG/ML IJ SOLN
1.0000 mg | Freq: Four times a day (QID) | INTRAMUSCULAR | Status: DC | PRN
  Administered 2012-03-16: 1 mg via INTRAVENOUS
  Filled 2012-03-15: qty 1

## 2012-03-15 MED ORDER — METFORMIN HCL 500 MG PO TABS: 500.0000 mg | ORAL_TABLET | Freq: Two times a day (BID) | ORAL | Status: DC

## 2012-03-15 MED ORDER — INFLUENZA VIRUS VACC SPLIT PF IM SUSP
0.5000 mL | INTRAMUSCULAR | Status: AC
  Administered 2012-03-16: 0.5 mL via INTRAMUSCULAR
  Filled 2012-03-15: qty 0.5

## 2012-03-15 MED ORDER — THIAMINE HCL 100 MG/ML IJ SOLN
100.0000 mg | Freq: Every day | INTRAMUSCULAR | Status: DC
  Filled 2012-03-15 (×2): qty 1

## 2012-03-15 MED ORDER — QUETIAPINE FUMARATE 100 MG PO TABS
100.0000 mg | ORAL_TABLET | Freq: Three times a day (TID) | ORAL | Status: DC
  Filled 2012-03-15 (×2): qty 2

## 2012-03-15 MED ORDER — LORAZEPAM 2 MG/ML IJ SOLN
0.0000 mg | Freq: Four times a day (QID) | INTRAMUSCULAR | Status: DC
  Administered 2012-03-15: 1 mg via INTRAVENOUS
  Administered 2012-03-16 (×2): 2 mg via INTRAVENOUS
  Filled 2012-03-15 (×3): qty 1

## 2012-03-15 MED ORDER — SIMVASTATIN 10 MG PO TABS
10.0000 mg | ORAL_TABLET | Freq: Every day | ORAL | Status: DC
  Administered 2012-03-15: 10 mg via ORAL
  Filled 2012-03-15 (×2): qty 1

## 2012-03-15 MED ORDER — SODIUM CHLORIDE 0.9 % IV BOLUS (SEPSIS)
1000.0000 mL | Freq: Once | INTRAVENOUS | Status: AC
  Administered 2012-03-15: 1000 mL via INTRAVENOUS

## 2012-03-15 MED ORDER — ONDANSETRON HCL 4 MG/2ML IJ SOLN: 4.0000 mg | Freq: Four times a day (QID) | INTRAMUSCULAR | Status: DC | PRN

## 2012-03-15 MED ORDER — QUETIAPINE FUMARATE 100 MG PO TABS
100.0000 mg | ORAL_TABLET | Freq: Two times a day (BID) | ORAL | Status: DC
  Administered 2012-03-15 – 2012-03-16 (×2): 100 mg via ORAL
  Filled 2012-03-15 (×3): qty 1

## 2012-03-15 MED ORDER — SODIUM CHLORIDE 0.9 % IJ SOLN
3.0000 mL | Freq: Two times a day (BID) | INTRAMUSCULAR | Status: DC
  Administered 2012-03-16: 3 mL via INTRAVENOUS

## 2012-03-15 MED ORDER — ACETAMINOPHEN 325 MG PO TABS: 650.0000 mg | ORAL_TABLET | Freq: Four times a day (QID) | ORAL | Status: DC | PRN

## 2012-03-15 MED ORDER — ACETAMINOPHEN 650 MG RE SUPP: 650.0000 mg | Freq: Four times a day (QID) | RECTAL | Status: DC | PRN

## 2012-03-15 MED ORDER — METOPROLOL TARTRATE 12.5 MG HALF TABLET
12.5000 mg | ORAL_TABLET | Freq: Two times a day (BID) | ORAL | Status: DC
  Administered 2012-03-15 – 2012-03-16 (×2): 12.5 mg via ORAL
  Filled 2012-03-15 (×3): qty 1

## 2012-03-15 MED ORDER — VITAMIN B-1 100 MG PO TABS
100.0000 mg | ORAL_TABLET | Freq: Every day | ORAL | Status: DC
  Administered 2012-03-15 – 2012-03-16 (×2): 100 mg via ORAL
  Filled 2012-03-15 (×2): qty 1

## 2012-03-15 MED ORDER — AMLODIPINE BESYLATE 5 MG PO TABS
5.0000 mg | ORAL_TABLET | Freq: Every day | ORAL | Status: DC
  Administered 2012-03-15 – 2012-03-16 (×2): 5 mg via ORAL
  Filled 2012-03-15 (×2): qty 1

## 2012-03-15 MED ORDER — MELOXICAM 15 MG PO TABS
15.0000 mg | ORAL_TABLET | Freq: Every day | ORAL | Status: DC
  Administered 2012-03-15 – 2012-03-16 (×2): 15 mg via ORAL
  Filled 2012-03-15 (×2): qty 1

## 2012-03-15 MED ORDER — BUSPIRONE HCL 10 MG PO TABS
10.0000 mg | ORAL_TABLET | Freq: Two times a day (BID) | ORAL | Status: DC
  Administered 2012-03-15 – 2012-03-16 (×2): 10 mg via ORAL
  Filled 2012-03-15 (×3): qty 1

## 2012-03-15 MED ORDER — ZOLPIDEM TARTRATE 10 MG PO TABS: 10.0000 mg | ORAL_TABLET | Freq: Every evening | ORAL | Status: DC | PRN

## 2012-03-15 MED ORDER — QUETIAPINE FUMARATE 200 MG PO TABS
200.0000 mg | ORAL_TABLET | Freq: Every day | ORAL | Status: DC
  Filled 2012-03-15: qty 1

## 2012-03-15 MED ORDER — LORAZEPAM 2 MG/ML IJ SOLN
0.0000 mg | Freq: Two times a day (BID) | INTRAMUSCULAR | Status: DC
Start: 2012-03-17 — End: 2012-03-16

## 2012-03-15 MED ORDER — ADULT MULTIVITAMIN W/MINERALS CH
1.0000 | ORAL_TABLET | Freq: Every day | ORAL | Status: DC
  Administered 2012-03-15 – 2012-03-16 (×2): 1 via ORAL
  Filled 2012-03-15 (×2): qty 1

## 2012-03-15 MED ORDER — METFORMIN HCL 500 MG PO TABS
1000.0000 mg | ORAL_TABLET | Freq: Every day | ORAL | Status: DC
  Administered 2012-03-15: 1000 mg via ORAL
  Filled 2012-03-15 (×2): qty 2

## 2012-03-15 MED ORDER — ONDANSETRON HCL 4 MG PO TABS: 4.0000 mg | ORAL_TABLET | Freq: Four times a day (QID) | ORAL | Status: DC | PRN

## 2012-03-15 MED ORDER — LOPERAMIDE HCL 2 MG PO CAPS: 2.0000 mg | ORAL_CAPSULE | Freq: Four times a day (QID) | ORAL | Status: DC | PRN

## 2012-03-15 MED ORDER — TRAMADOL HCL 50 MG PO TABS: 50.0000 mg | ORAL_TABLET | Freq: Four times a day (QID) | ORAL | Status: DC | PRN

## 2012-03-15 MED ORDER — DIVALPROEX SODIUM 500 MG PO DR TAB
500.0000 mg | DELAYED_RELEASE_TABLET | Freq: Three times a day (TID) | ORAL | Status: DC
  Administered 2012-03-15 – 2012-03-16 (×3): 500 mg via ORAL
  Filled 2012-03-15 (×5): qty 1

## 2012-03-15 MED ORDER — PNEUMOCOCCAL VAC POLYVALENT 25 MCG/0.5ML IJ INJ
0.5000 mL | INJECTION | INTRAMUSCULAR | Status: AC
  Administered 2012-03-16: 0.5 mL via INTRAMUSCULAR
  Filled 2012-03-15: qty 0.5

## 2012-03-15 MED ORDER — FOLIC ACID 1 MG PO TABS
1.0000 mg | ORAL_TABLET | Freq: Every day | ORAL | Status: DC
  Administered 2012-03-15 – 2012-03-16 (×2): 1 mg via ORAL
  Filled 2012-03-15 (×2): qty 1

## 2012-03-15 MED ORDER — NALOXONE HCL 1 MG/ML IJ SOLN
2.0000 mg | Freq: Once | INTRAMUSCULAR | Status: AC
  Administered 2012-03-15: 2 mg via INTRAVENOUS
  Filled 2012-03-15: qty 4

## 2012-03-15 NOTE — ED Notes (Signed)
Spoke with RN about obtaining urine and he recommended waiting for pt to become more alert and can urinate independently.

## 2012-03-15 NOTE — ED Provider Notes (Signed)
Medical screening examination/treatment/procedure(s) were performed by non-physician practitioner and as supervising physician I was immediately available for consultation/collaboration.    Nelia Shi, MD 03/15/12 [phone]

## 2012-03-15 NOTE — ED Notes (Signed)
WJX:[id number]<WG> Expected date:03/15/12<BR> Expected time: 5:32 AM<BR> Means of arrival:Ambulance<BR> Comments:<BR> Medical clearance  In police custody

## 2012-03-15 NOTE — ED Provider Notes (Addendum)
ECG shows normal sinus rhythm with a rate of 89, no ectopy. Normal axis. Normal P wave. Normal QRS. Normal intervals. Normal ST and T waves. Impression: normal ECG. When compared with ECG of 12/28/2008, no significant changes are seen.  Medical screening examination/treatment/procedure(s) were conducted as a shared visit with non-physician practitioner(s) and myself.  I personally evaluated the patient during the encounter    Dione Booze, MD 03/15/12 [phone]  Patient noted originally presented with an overdose of tramadol, alcohol, and Seroquel and is a suggestion that she may achieve some Tylenol with codeine. She had initially been awake, but she now has become somewhat agitated and difficult to arouse. Speech is slurred. She is also become tachycardic. To this point, the urine has not been obtained for drug screen, and he had a catheterized urine will need to be obtained. Poison control need to be consulted and she will need to be admitted from a medical standpoint. She will be given a therapeutic trial of naloxone to see if she wakes up with that.  She did wake up with naloxone. She will need to be admitted for observation since naloxone does not last as long as narcotics. Poison control has been consulted and recommended repeat ECG which showed no significant QRS or QT widening. Arrangements have been made for hospital admission.   Date: 03/15/2012 1021  Rate: 132  Rhythm: sinus tachycardia  QRS Axis: normal  Intervals: normal  ST/T Wave abnormalities: normal  Conduction Disutrbances:none  Narrative Interpretation: Sinus tachycardia. When compared with ECG of earlier today, only change noted is in heart rate.  Old EKG Reviewed: unchanged    CRITICAL CARE Performed by: RUEAV,WUJWJ   Total critical care time: 45 minutes  Critical care time was exclusive of separately billable procedures and treating other patients.  Critical care was necessary to treat or prevent imminent or  life-threatening deterioration.  Critical care was time spent personally by me on the following activities: development of treatment plan with patient and/or surrogate as well as nursing, discussions with consultants, evaluation of patient's response to treatment, examination of patient, obtaining history from patient or surrogate, ordering and performing treatments and interventions, ordering and review of laboratory studies, ordering and review of radiographic studies, pulse oximetry and re-evaluation of patient's condition.   Dione Booze, MD 03/15/12 1047

## 2012-03-15 NOTE — ED Notes (Signed)
Report given via EMS. Pt c/o overdose with tramadol, alcohol (moonshine), and seroquel. Pt reports SI/HI (threatening to kill boyfriend). Initial VS cbg 179 BP 102 palpated HR 110 steady ST at 0515. BP decreased 90/60 at 0555.

## 2012-03-15 NOTE — Plan of Care (Signed)
Problem: Consults Goal: General Medical Patient Education See Patient Education Module for specific education. Outcome: Not Progressing Unable to assess or start teaching, patient combative and clearly disoriented at this time. She is in four point restraints with a 1:1 sitter present

## 2012-03-15 NOTE — H&P (Addendum)
[facility] History and Physical  Lynn Palmer MVH:[id number] DOB: 20-Mar-1961 DOA: 03/15/2012  Referring physician: ER physician PCP: Lonia Blood, MD   Chief Complaint: altered mental status  HPI:  51 year old female with history of alcohol abuse, polysubstance abuse, diabetes, bipolar disorder and previous hospitalizations to [facility] who presented to ED with altered mental status. Patient was and still is unable to provide her medical history and is very agitated and combative. History was obtained from ED physician. Patient was initially awake and alert in ED but then suddenly somnolent and hard to arouse. Patient was subsequently given naloxone and then became very agitated and disoriented. Evaluation in ED revealed positive UDS for amphetamines and THC. Furthermore, her alcohol level on this admission was elevated at 172.  Assessment and Plan:  Principal Problem:  *Acute alcohol intoxication  With alcohol level being 172 on this admission  We have started the patient on CIWA protocol  Patient does require medical restraints at this time  Monitor for withdrawals  Continue IV fluids  Active Problems: Bipolar disorder, psychosis  Psych consult requested today so they will see the patient in am  We will continue current home medications until psych evaluation suggest other management  Diabetes mellitus  Continue metformin per home regimen  Hypertension  Continue Norvasc 5 mg daily and metoprolol 12.5 mg BID  Hypokalemia  replted in ED Follow up BMP in am  Seizure disorder  Follow up valproic acid level  Continue depakote per home regimen  Code Status: Full Family Communication: family not at bedside Disposition Plan: Admit for further evaluation  Manson Passey, MD  [facility] Pager [phone]  Consultants:  Psychiatry   Antibiotics:  None    If 7PM-7AM, please contact night-coverage [email or web address] Password [facility] 03/15/2012, 5:58 PM  Review of Systems:   Unable to obtain due to patient's mental status, she is agitated, combative.  Past Medical History  Diagnosis Date  . Bipolar 1 disorder   . Obesity   . Diabetes mellitus   . Hyperlipidemia   . Hypertension   . Vomiting   . Depression   . Anxiety   . Substance abuse     alcohol   . Seizures     as a teenager   Past Surgical History  Procedure Date  . Tonsillectomy   . Laparoscopic gastric banding 06/11/09  . Vaginal delivery 1987   Social History:  reports that she has quit smoking. She has never used smokeless tobacco. She reports that she drinks about 3 ounces of alcohol per week. She reports that she does not use illicit drugs.  No Known Allergies  Family History: unable to obtain due to patient's mental status  Prior to Admission medications   Medication Sig Start Date End Date Taking? Authorizing Provider  amLODipine (NORVASC) 5 MG tablet Take 5 mg by mouth daily.    Historical Provider, MD  busPIRone (BUSPAR) 10 MG tablet Take 10 mg by mouth 2 (two) times daily.     Historical Provider, MD  divalproex (DEPAKOTE) 500 MG DR tablet Take 1 tablet (500 mg total) by mouth 3 (three) times daily. For mood stabilization and mania. 06/19/11 06/18/12  Verne Spurr, PA-C  loperamide (IMODIUM) 2 MG capsule Take 2 mg by mouth 4 (four) times daily as needed. For runny stools    Historical Provider, MD  meloxicam (MOBIC) 15 MG tablet Take 15 mg by mouth daily. 10/03/11 10/02/12  Luiz Blare, MD  metFORMIN (GLUCOPHAGE) 500 MG tablet 500-1,000 mg 2 (  two) times daily. Take 1 tab in am and 2 tabs to equal 1000mg  at night 11/14/10   Historical Provider, MD  metoprolol (LOPRESSOR) 50 MG tablet 2 (two) times daily.  11/14/10   Historical Provider, MD  NORTREL 7/7/7 0.5/0.75/1-35 MG-MCG tablet Daily. 11/14/10   Historical Provider, MD  pravastatin (PRAVACHOL) 20 MG tablet Take 20 mg by mouth daily.      Historical Provider, MD  QUEtiapine (SEROQUEL) 100 MG tablet Take 100-200 mg by mouth 3 (three)  times daily. 1 in am and pm, 2 at bedtime    Historical Provider, MD  QUEtiapine (SEROQUEL) 200 MG tablet Take 1 tablet (200 mg total) by mouth at bedtime. For mood stabilization and anxiety. 06/19/11 07/19/11  Verne Spurr, PA-C  QUEtiapine (SEROQUEL) 50 MG tablet Take 50 mg by mouth 2 (two) times daily. In the morning and at lunch for anxiety and mood stability. 06/19/11 07/19/11  Verne Spurr, PA-C  traMADol (ULTRAM) 50 MG tablet Take 50 mg by mouth every 6 (six) hours as needed. Pain    Historical Provider, MD  traZODone (DESYREL) 150 MG tablet Take 150 mg by mouth at bedtime as needed. Sleep    Historical Provider, MD  zolpidem (AMBIEN) 10 MG tablet Take 10 mg by mouth at bedtime as needed. For sleep    Historical Provider, MD   Physical Exam: Filed Vitals:   03/15/12 0715 03/15/12 0800 03/15/12 1105 03/15/12 1210  BP: 107/59   132/71  Pulse:   132 107  Temp:   98.2 F (36.8 C)   TempSrc:   Oral   Resp:    16  Weight:      SpO2:  98%  96%    Physical Exam  Constitutional: Appears in no acute distress, agitated and combative  HENT: Normocephalic. No tonsillar erythema or exudates Eyes: Conjunctivae and EOM are normal. PERRLA, no scleral icterus.  Neck: Normal ROM. Neck supple. No JVD. No tracheal deviation. No thyromegaly.  CVS: RRR, S1/S2 +, no murmurs, no gallops, no carotid bruit.  Pulmonary: Effort and breath sounds normal, no stridor, rhonchi, wheezes, rales.  Abdominal: Soft. BS +,  no distension, tenderness, rebound or guarding.  Musculoskeletal: no edema, pulses palpable  Lymphadenopathy: No lymphadenopathy noted, cervical, inguinal. Neuro: combative, awake but disoriented Skin: Skin is warm and dry. No rash noted.  Psychiatric: unable to assess due to patient's mental status.   Labs on Admission:  Basic Metabolic Panel:  Lab 03/15/12 1914  NA 134*  K 3.3*  CL 98  CO2 24  GLUCOSE 154*  BUN 14  CREATININE 1.02  CALCIUM 8.4   Liver Function Tests:  Lab 03/15/12  0615  AST 14  ALT 7  ALKPHOS 46  BILITOT 0.2*  PROT 5.9*  ALBUMIN 2.6*   CBC:  Lab 03/15/12 0615  WBC 7.0  HGB 10.8*  HCT 32.3*  MCV 85.4  PLT 248   CBG:  Lab 03/15/12 0605  GLUCAP 144*    Radiological Exams on Admission: No results found.  EKG: Normal sinus rhythm, no ST/T wave changes  Time spent: 75 minutes

## 2012-03-15 NOTE — Progress Notes (Addendum)
Received from ED via stretcher in four point restraints.  Confused, verbally abusive fighting/pulling against restraints.  Patient states she is at [facility], does not believe in the hospital and does not know what has brought her to this state.  She does state she does not want to go to jail.  Circulation and all four limbs examined and no redness, swelling, abrasions or circulation issues present.  Sitter present in room , voided large amount on the bedpan, telemetry box # 49 applied, hospital gown placed on patient.  Unable to assess patients preferred teaching methods at this time due to her mental state

## 2012-03-15 NOTE — ED Notes (Signed)
Pt took Seroquel with Tramadol while drinking moonshine after arguing with her boyfriend.  Pt stating "I don't want to live anymore! I hate my life! I'm going to kill Ryan!"  Pt stating "I'm in pain all over."  Pt noted to have white paste and emesis on clothing.

## 2012-03-15 NOTE — Progress Notes (Addendum)
Shift event: pt converted to 1st deg AV block from NSR. HR 90. PRI .22. 12 lead EKG ordered stat. EKG showed pt back in NSR with normal PRI and QRS. Will monitor for now. If recurs, will call cardio. Mg and CMP ordered. RN spoke to poison control, who recommended labs b/c pt has admitted to use of Moonshine in past. Lead level ordered. Also, Depakote level had risen slightly, so poison control recommended repeat. Labs ordered. Will cont to follow.  Maren Reamer, NP [facility] Addendum: called monitor tech back and pt had quickly reverted back to NSR with normal PRI. Stayed in NSR rest of shift. Maren Reamer, NP [facility]
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
## 2012-03-16 DIAGNOSIS — T50901A Poisoning by unspecified drugs, medicaments and biological substances, accidental (unintentional), initial encounter: Secondary | ICD-10-CM

## 2012-03-16 DIAGNOSIS — F3289 Other specified depressive episodes: Secondary | ICD-10-CM

## 2012-03-16 DIAGNOSIS — F329 Major depressive disorder, single episode, unspecified: Secondary | ICD-10-CM

## 2012-03-16 DIAGNOSIS — E119 Type 2 diabetes mellitus without complications: Secondary | ICD-10-CM

## 2012-03-16 DIAGNOSIS — T50904A Poisoning by unspecified drugs, medicaments and biological substances, undetermined, initial encounter: Secondary | ICD-10-CM

## 2012-03-16 DIAGNOSIS — F1010 Alcohol abuse, uncomplicated: Secondary | ICD-10-CM

## 2012-03-16 LAB — GLUCOSE, CAPILLARY
Glucose-Capillary: 98 mg/dL (ref 70–99)
Glucose-Capillary: 184 mg/dL — ABNORMAL HIGH (ref 70–99)

## 2012-03-16 LAB — CBC
MCHC: 33.1 g/dL (ref 30.0–36.0)
Platelets: 283 10*3/uL (ref 150–400)
RDW: 14.1 % (ref 11.5–15.5)
WBC: 8.8 10*3/uL (ref 4.0–10.5)

## 2012-03-16 LAB — URINE CULTURE: Colony Count: NO GROWTH

## 2012-03-16 LAB — LEAD, BLOOD
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
## 2012-03-16 NOTE — Progress Notes (Signed)
Pt was unable to make an appt with Philis Nettle, as she states she had to leave a message for the appt desk.  CSW attempted to make an appt on Pt's behalf.  Appt coordinator is off until the 6th of this month.  Appt coordinator leave 2 numbers, including her supervisor's number, to contact for appointments.  LM with Pt's supervisor.  Providence Crosby, LCSWA Clinical Social Work [phone]

## 2012-03-16 NOTE — Discharge Summary (Signed)
Physician Discharge Summary  Lynn Palmer:[id number] DOB: June 12, 1960 DOA: 03/15/2012  PCP: Lonia Blood, MD  Admit date: 03/15/2012 Discharge date: 03/16/2012  Recommendations for Outpatient Follow-up:  1. Pt will need to follow up with PCP in 2 weeks post discharge 2. Patient was instructed to go to [facility] tomorrow as walk-in to f/u for mental health services  Discharge Diagnoses:  Principal Problem:  *Acute alcohol intoxication Active Problems:  Morbid obesity  Diabetes mellitus  Hypertension  Alcoholism /alcohol abuse  Polysubstance abuse  Back pain, lumbosacral  Bipolar disorder  Altered mental status  Hypokalemia  Anemia   Discharge Condition: stable  Disposition: home  Diet: regular Wt Readings from Last 3 Encounters:  03/16/12 85.1 kg (187 lb 9.8 oz)  01/04/12 82.555 kg (182 lb)  05/08/11 84.097 kg (185 lb 6.4 oz)    History of present illness:  51 year old female with history of alcohol abuse, polysubstance abuse, diabetes, bipolar disorder and previous hospitalizations to [facility] who presented to ED with altered mental status. Patient was and still is unable to provide her medical history and is very agitated and combative. History was obtained from ED physician. Patient was initially awake and alert in ED but then suddenly somnolent and hard to arouse. Patient was subsequently given naloxone and then became very agitated and disoriented.  Evaluation in ED revealed positive UDS for amphetamines and THC. Furthermore, her alcohol level on this admission was elevated at 172.   Hospital Course:  A 1:1 sitter was placed with the patient. In the morning, the patient was more lucid and alert and oriented. The patient was taken off of her restraints. The patient clarified that she drank "[place]" which is a brand of liquor she gets at the [facility] store.  She does not make her own "moonshine" as previously thought. Workup revealed that the patient's valproic acid  level was 32. The most recent check before discharge revealed that it was less than 10. Acetaminophen and salicylate levels were negative. Urine drug screen showed positive amphetamines and cannabis. Urine culture was negative. Urinalysis did not suggest UTI. EKG showed normal sinus rhythm. The most recent EKG did not show any PR prolongation. Repeat CBC showed a stable hemoglobin. The patient was placed on alcohol withdrawal protocol. The patient was evaluated by psychiatry. The patient was cleared by psychiatry to be discharged home. Instructions were given to the patient to take Seroquel 400 mg at 9 PM. She is to continue her Depakote and BuSpar. The patient remained hemodynamically stable. She was afebrile. She denies having tachycardia. The patient's mentation returned to baseline with psychiatry saw the patient in the morning. The patient was given instructions by psychiatry to follow up at her therapist [facility].  It was confirmed that the patient can just walk in without an appointment tomorrow.  This was told the patient and she expressed understanding  Consultants: Psychiatry, Dr. Ferol Luz  Discharge Exam: Filed Vitals:   03/16/12 1401  BP: 145/81  Pulse: 91  Temp: 98.7 F (37.1 C)  Resp: 24   Filed Vitals:   03/16/12 0548 03/16/12 0628 03/16/12 1021 03/16/12 1401  BP:  153/86 152/86 145/81  Pulse:  88 87 91  Temp:  97.9 F (36.6 C)  98.7 F (37.1 C)  TempSrc:  Oral  Oral  Resp:  20  24  Height:      Weight: 85.1 kg (187 lb 9.8 oz)     SpO2:  100%  95%   General: A&O x 3,  NAD, pleasant, cooperative Cardiovascular: RRR, no rub, no gallop, no S3 Respiratory: CTAB, no wheeze, no rhonchi Abdomen:soft, nontender, nondistended, positive bowel sounds Extremities: No edema, No lymphangitis, no petechiae  Discharge Instructions  Discharge Orders    Future Orders Please Complete By Expires   Diet - low sodium heart healthy      Increase activity slowly      Discharge instructions       Comments:   Go to [facility] clinic for follow up tomorrow.  Just walk in--no appointment necessary.       Medication List     As of 03/16/2012  6:47 PM    STOP taking these medications         divalproex 500 MG 24 hr tablet   Commonly known as: DEPAKOTE ER      TAKE these medications         amLODipine 5 MG tablet   Commonly known as: NORVASC   Take 5 mg by mouth daily.      busPIRone 15 MG tablet   Commonly known as: BUSPAR   Take 15 mg by mouth 2 (two) times daily.      divalproex 500 MG DR tablet   Commonly known as: DEPAKOTE   Take 1 tablet (500 mg total) by mouth 3 (three) times daily. For mood stabilization and mania.      loperamide 2 MG capsule   Commonly known as: IMODIUM   Take 2 mg by mouth 4 (four) times daily as needed. For runny stools      metFORMIN 500 MG tablet   Commonly known as: GLUCOPHAGE   500-1,000 mg 2 (two) times daily. Take 1 tab in am and 2 tabs to equal 1000mg  at night      metoprolol tartrate 25 MG tablet   Commonly known as: LOPRESSOR   Take 50 mg by mouth 2 (two) times daily.      NORTREL 7/7/7 0.5/0.75/1-35 MG-MCG tablet   Generic drug: norethindrone-ethinyl estradiol   Daily.      pravastatin 20 MG tablet   Commonly known as: PRAVACHOL   Take 20 mg by mouth daily.      QUEtiapine 50 MG tablet   Commonly known as: SEROQUEL   Take 50 mg by mouth 2 (two) times daily. In the morning and at lunch for anxiety and mood stability.      QUEtiapine 200 MG tablet   Commonly known as: SEROQUEL   Take 400 mg by mouth at bedtime.      traMADol 50 MG tablet   Commonly known as: ULTRAM   Take 50 mg by mouth every 6 (six) hours as needed. Pain      traZODone 150 MG tablet   Commonly known as: DESYREL   Take 150 mg by mouth at bedtime as needed. Sleep         The results of significant diagnostics from this hospitalization (including imaging, microbiology, ancillary and laboratory) are listed below for reference.    Significant  Diagnostic Studies: Dg Shoulder Right  03/15/2012  *RADIOLOGY REPORT*  Clinical Data: Right shoulder pain.  No known injury.  RIGHT SHOULDER - 2+ VIEW  Comparison:  None.  Findings:  There is no evidence of fracture or dislocation.  There is no evidence of arthropathy or other focal bone abnormality. Soft tissues are unremarkable.  IMPRESSION: Negative.   Original Report Authenticated By: Myles Rosenthal, M.D.      Microbiology: Recent Results (from the past 240 hour(s))  URINE CULTURE     Status: Normal   Collection Time   03/15/12 10:30 AM      Component Value Range Status Comment   Specimen Description URINE, CATHETERIZED   Final    Special Requests NONE   Final    Culture  Setup Time 03/15/2012 14:05   Final    Colony Count NO GROWTH   Final    Culture NO GROWTH   Final    Report Status 03/16/2012 FINAL   Final      Labs: Basic Metabolic Panel:  Lab 03/15/12 1308 03/15/12 0615  NA 140 134*  K 4.2 3.3*  CL 105 98  CO2 27 24  GLUCOSE 144* 154*  BUN 13 14  CREATININE 1.10 1.02  CALCIUM 8.6 8.4  MG 1.6 --  PHOS -- --   Liver Function Tests:  Lab 03/15/12 2050 03/15/12 0615  AST 21 14  ALT 9 7  ALKPHOS 49 46  BILITOT 0.4 0.2*  PROT 5.7* 5.9*  ALBUMIN 2.6* 2.6*   No results found for this basename: LIPASE:5,AMYLASE:5 in the last 168 hours No results found for this basename: AMMONIA:5 in the last 168 hours CBC:  Lab 03/16/12 0520 03/15/12 0615  WBC 8.8 7.0  NEUTROABS -- 2.6  HGB 11.9* 10.8*  HCT 36.0 32.3*  MCV 86.5 85.4  PLT 283 248   Cardiac Enzymes: No results found for this basename: CKTOTAL:5,CKMB:5,CKMBINDEX:5,TROPONINI:5 in the last 168 hours BNP: No components found with this basename: POCBNP:5 CBG:  Lab 03/16/12 1024 03/16/12 0719 03/16/12 0441 03/15/12 1400 03/15/12 0605  GLUCAP 184* 154* 98 93 144*    Time coordinating discharge:  Greater than 30 minutes  Signed:  Kenzlee Fishburn, DO [facility] Pager: [phone] 03/16/2012, 6:47 PM
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
## 2012-03-16 NOTE — Progress Notes (Signed)
MD notified of change from NSR to first degree heart block, 12 Lead performed.  I spoke with poison control who recommended we check Blood Lead levels because the patient admitted to drinking moonshine prior to admission.  She also recommended a repeat BMP and Depakote Level. MD made aware of poison control conversation. Pt is irritable but otherwise cooperative and stable. Will continue to monitor.

## 2012-03-16 NOTE — Consult Note (Signed)
Patient Identification:  Lynn Palmer Date of Evaluation:  03/16/2012 Reason for Consult: Psychosis, alcohol overdose  Referring Provider: Dr. Arbutus Leas  History of Present Illness:Pt has been drinking alcohol and BF gave her some Adderall 'to wake me up'.  Her tox. screen was positive for amphetamines and cannabis.  She says she has a court date Dec. 6th and she wants to leave to be present for her grandbabies birthdays this month.  She perseverates that she does not want to be sent to [facility] or long term treatment facility.  She took 2-3 klonopin tabs while drinking  Alcohol that friends gave her.   Past Psychiatric History:She grew up with mother who was drinking alcohol all the time; She began drinking at age 72.  She has had periods of sobriety, has gone to [employer] [for 30 years], volunteered and then relapsed  Past Medical History:     Past Medical History  Diagnosis Date  . Bipolar 1 disorder   . Obesity   . Diabetes mellitus   . Hyperlipidemia   . Hypertension   . Vomiting   . Depression   . Anxiety   . Substance abuse     alcohol   . Seizures     as a teenager       Past Surgical History  Procedure Date  . Tonsillectomy   . Laparoscopic gastric banding 06/11/09  . Vaginal delivery 1987    Allergies: No Known Allergies  Current Medications:  Prior to Admission medications   Medication Sig Start Date End Date Taking? Authorizing Provider  amLODipine (NORVASC) 5 MG tablet Take 5 mg by mouth daily.    Historical Provider, MD  busPIRone (BUSPAR) 10 MG tablet Take 10 mg by mouth 2 (two) times daily.     Historical Provider, MD  divalproex (DEPAKOTE) 500 MG DR tablet Take 1 tablet (500 mg total) by mouth 3 (three) times daily. For mood stabilization and mania. 06/19/11 06/18/12  Verne Spurr, PA-C  loperamide (IMODIUM) 2 MG capsule Take 2 mg by mouth 4 (four) times daily as needed. For runny stools    Historical Provider, MD  meloxicam (MOBIC) 15 MG tablet Take 15 mg by mouth  daily. 10/03/11 10/02/12  Luiz Blare, MD  metFORMIN (GLUCOPHAGE) 500 MG tablet 500-1,000 mg 2 (two) times daily. Take 1 tab in am and 2 tabs to equal 1000mg  at night 11/14/10   Historical Provider, MD  metoprolol (LOPRESSOR) 50 MG tablet 2 (two) times daily.  11/14/10   Historical Provider, MD  NORTREL 7/7/7 0.5/0.75/1-35 MG-MCG tablet Daily. 11/14/10   Historical Provider, MD  pravastatin (PRAVACHOL) 20 MG tablet Take 20 mg by mouth daily.      Historical Provider, MD  QUEtiapine (SEROQUEL) 100 MG tablet Take 100-200 mg by mouth 3 (three) times daily. 1 in am and pm, 2 at bedtime    Historical Provider, MD  QUEtiapine (SEROQUEL) 200 MG tablet Take 1 tablet (200 mg total) by mouth at bedtime. For mood stabilization and anxiety. 06/19/11 07/19/11  Verne Spurr, PA-C  QUEtiapine (SEROQUEL) 50 MG tablet Take 50 mg by mouth 2 (two) times daily. In the morning and at lunch for anxiety and mood stability. 06/19/11 07/19/11  Verne Spurr, PA-C  traMADol (ULTRAM) 50 MG tablet Take 50 mg by mouth every 6 (six) hours as needed. Pain    Historical Provider, MD  traZODone (DESYREL) 150 MG tablet Take 150 mg by mouth at bedtime as needed. Sleep    Historical Provider,  MD  zolpidem (AMBIEN) 10 MG tablet Take 10 mg by mouth at bedtime as needed. For sleep    Historical Provider, MD    Social History:    reports that she has quit smoking. She has never used smokeless tobacco. She reports that she drinks about 3 ounces of alcohol per week. She reports that she does not use illicit drugs.   Family History:    History reviewed. No pertinent family history.  Mental Status Examination/Evaluation: Objective:  Appearance: Disheveled and obese  Eye Contact::  Good  Speech:  Clear and Coherent and Normal Rate  Volume:  Normal  Mood:  anxious  Affect:  Congruent and anxious, paranoid about being sent somewhere  Thought Process:  Disorganized and perseverates about hypothetical situations she is unable to manage   Orientation:  Other:  Oriented to person, place situation, pending court issues, family ; mixed priorities  Thought Content:  Paranoid Ideation and cying about her grandchildren and need to be with them vs. shoplifting charge that may sentence her to 3 yrs prison  Suicidal Thoughts:  No  Homicidal Thoughts:  No  Judgement:  Impaired  Insight:  Lacking   DIAGNOSIS:   AXIS I   Bipolar Disorder, Alcohol Dependence; polysubstance abuse  AXIS II  Deferred  AXIS III See medical notes.  AXIS IV economic problems, other psychosocial or environmental problems, problems related to legal system/crime, problems related to social environment, problems with primary support group and mixed priorities: drinking; mixed drugs/ worries about family  AXIS V 51-60 moderate symptoms   Assessment/Plan:  Discussed with Dr.Tat per Psych CSW Pt is awake alert and able to report psychiatric care: MD and therapist  She has met a BF 51 yo on the bus.  He also goes to [employer]  He has plans to enter the [facility] program for rehab and then attend Drug  Court.   She also has a couple living in 1 room for financial assistance.  She is apparently attached to her grandchildren and insists before any discussion that she refuses to go to any inpatient program.  She is tearful that she is to appear in court for shoplifting.   - children's jeans.  She is connected for therapy at [facility].  She makes an appt when given the tel. No.   She denies suicidal ideation; she took an unfortunate mix of pills provided by BF and friend.   She has had a relapse and does know what to do.  She wants to go home.  RECOMMENDATION:  1.  Pt has capacity to engage in her therapy and take medications; she has exhibited poor judgment. 2.  Pt has taken initiative to make appt at [facility].  3.. Suggest schedule Seroquel 400 mg at 8 pm  4.  Discourage use of benzodizepines, amphetamines. - report side effects to psychiatrist at [facility]. 5.  Encourage  pt to return to AA 6.  No further psychiatric needs.  MD Psychaitrist signs off.  Mickeal Skinner MD 03/16/2012 10:47 AM

## 2012-03-16 NOTE — Evaluation (Addendum)
Occupational Therapy Evaluation Patient Details Name: Lynn Palmer MRN: [id number] DOB: 09-May-1960 Today's Date: 03/16/2012 Time: 4098-1191 OT Time Calculation (min): 15 min  OT Assessment / Plan / Recommendation Clinical Impression  Pt admitted with acute alcohol intoxication and she displays decreased safety awareness, some soreness in R UE, and overall decreased independence with ADL. She states she is moving her R UE better than yesterday however. Will benefit from skilled OT while on acute to improve safety and independence.     OT Assessment  Patient needs continued OT Services    Follow Up Recommendations  No OT follow up;Supervision/Assistance - 24 hour    Barriers to Discharge      Equipment Recommendations  None recommended by OT    Recommendations for Other Services    Frequency  Min 2X/week    Precautions / Restrictions Precautions Precautions: Fall Precaution Comments: due to pt being so impulsive; sitter in room Restrictions Weight Bearing Restrictions: No        ADL  Eating/Feeding: Simulated;Independent Where Assessed - Eating/Feeding: Edge of bed Grooming: Performed;Wash/dry hands;Min guard;Other (comment) (for safety. pt impulsive. see below) Where Assessed - Grooming: Unsupported standing Upper Body Bathing: Simulated;Chest;Right arm;Left arm;Abdomen;Supervision/safety;Set up Where Assessed - Upper Body Bathing: Unsupported sitting Lower Body Bathing: Simulated;Min guard Where Assessed - Lower Body Bathing: Supported sit to stand Upper Body Dressing: Simulated;Minimal assistance;Other (comment) (sore R UE. helped with gown) Where Assessed - Upper Body Dressing: Unsupported sitting Lower Body Dressing: Simulated;Min guard Where Assessed - Lower Body Dressing: Supported sit to stand Toilet Transfer: Performed;Min guard Acupuncturist: Comfort height toilet;Grab bars Toileting - Architect and Hygiene: Simulated;Min guard Where  Assessed - Engineer, mining and Hygiene: Sit to stand from 3-in-1 or toilet Tub/Shower Transfer Method: Not assessed ADL Comments: Pt moves quickly and is impulsive and displays decreased safety awareness of her IV line and tends to leave it behind. When the phone rang, she hurried out of the bathroom despite therapist having IV ready/cleared out of way more. Sitter in room and present for session.     OT Diagnosis: Generalized weakness;Cognitive deficits  OT Problem List: Decreased strength;Decreased safety awareness;Decreased knowledge of use of DME or AE OT Treatment Interventions: Self-care/ADL training;Therapeutic activities;Cognitive remediation/compensation;DME and/or AE instruction;Patient/family education   OT Goals Acute Rehab OT Goals OT Goal Formulation: With patient Time For Goal Achievement: 03/30/12 Potential to Achieve Goals: Good ADL Goals Pt Will Perform Grooming: with supervision;Standing at sink ADL Goal: Grooming - Progress: Goal set today Additional ADL Goal #1: Pt will perform own setup of bathing/dressing items and complete task with supervision and min of one verbal cue for safety. ADL Goal: Additional Goal #1 - Progress: Goal set today  Visit Information  Last OT Received On: 03/16/12 Assistance Needed: +1 PT/OT Co-Evaluation/Treatment: Yes    Subjective Data  Subjective: pt acknowledges therapists and states she needs to go to the bathroom when asked Patient Stated Goal: none stated   Prior Functioning     Home Living Lives With: Significant other Bathroom Shower/Tub: Tub/shower unit Bathroom Toilet: Standard Home Adaptive Equipment: None Additional Comments: pt doesnt use a device inside her home. uses a shopping cart to hold to when she goes out.  Prior Function Level of Independence: Needs assistance (uses a shopping cart when she goes out to help balance. ) Needs Assistance: Meal Prep;Light Housekeeping Meal Prep: Total (boyfriend  does) Light Housekeeping: Total Communication Communication: No difficulties Dominant Hand: Right  Vision/Perception     Cognition  Overall Cognitive Status: History of cognitive impairments - at baseline Arousal/Alertness: Awake/alert Behavior During Session: Other (comment) (impulsive) Cognition - Other Comments: pt is impulsive and displays decreased safety awareness (ie awareness of IV line, etc)    Extremity/Trunk Assessment Right Upper Extremity Assessment RUE ROM/Strength/Tone: Unable to fully assess;Due to pain (pt states R UE is sore. Able to move UE at all joints. ) Left Upper Extremity Assessment LUE ROM/Strength/Tone: [facility] for tasks assessed     Mobility Bed Mobility Bed Mobility: Sit to Supine Sit to Supine: 5: Supervision Details for Bed Mobility Assistance: pt is impulsive and needs supervision for safety. pt at EOB when therapy arrived.  Transfers Transfers: Sit to Stand;Stand to Sit Sit to Stand: 4: Min guard;From bed;From toilet Stand to Sit: 4: Min guard;To toilet;To bed Details for Transfer Assistance: min guard for safety as pt is very impulsive and decreased safety awareness displayed.      Shoulder Instructions     Exercise     Balance     End of Session OT - End of Session Equipment Utilized During Treatment: Gait belt Activity Tolerance: Patient tolerated treatment well Patient left: in bed;with call bell/phone within reach;Other (comment) (with sitter)  GO Functional Assessment Tool Used: clinical judgement Functional Limitation: Self care Self Care Current Status [phone]): At least 1 percent but less than 20 percent impaired, limited or restricted Self Care Goal Status (U0454): At least 1 percent but less than 20 percent impaired, limited or restricted   Lynn Palmer [phone] 03/16/2012, 1:30 PM

## 2012-03-16 NOTE — Progress Notes (Signed)
Spoke with Chief of Staff at [employer].  Per Mrs. Crutchfield, Pt's discharging from the hospital, even if they are an existing pt, need to present as walk-ins M-F from 8-3.  No appt needed.  Notified MD.  MD ok with this arrangement and intends to d/c Pt today.  Notified Pt.  Pt, with her boyfriend present, expressed her intent to present to [facility].  Provided Pt with a [employer] which includes the address, phone number and walk-in information.  CSW thanked Pt for her time.  Pt to be d/c'd.  Providence Crosby, LCSWA Clinical Social Work [phone]

## 2012-03-16 NOTE — Evaluation (Signed)
Physical Therapy Evaluation Patient Details Name: Lynn Palmer MRN: [id number] DOB: [birth year] Today's Date: 03/16/2012 Time: 1308-6578 PT Time Calculation (min): 15 min  PT Assessment / Plan / Recommendation Clinical Impression  51 yo female admitted with acute ETOH intoxication and polysubstance abuse. On eval pt appears very anxious, repeatedly asking each person if he/she has a phone. Impulsivity and decreased safety awareness may increase pt's risk for falling. Overall, pt required Min-guard assist for mobility. Do not anticipate pt wil have any PT needs at discharge. Will follow during stay.     PT Assessment  Patient needs continued PT services    Follow Up Recommendations  No PT follow up    Does the patient have the potential to tolerate intense rehabilitation      Barriers to Discharge        Equipment Recommendations  None recommended by PT    Recommendations for Other Services OT consult   Frequency Min 2X/week    Precautions / Restrictions Precautions Precautions: Fall Precaution Comments: due to pt being so impulsive; sitter in room Restrictions Weight Bearing Restrictions: No   Pertinent Vitals/Pain R arm 5/10      Mobility  Bed Mobility Bed Mobility: Supine to Sit;Sit to Supine Supine to Sit: 5: Supervision Sit to Supine: 5: Supervision Details for Bed Mobility Assistance: pt is impulsive and needs supervision for safety. pt at EOB when therapy arrived.  Transfers Transfers: Sit to Stand;Stand to Sit Sit to Stand: 4: Min guard;From bed Stand to Sit: 4: Min guard;To bed Details for Transfer Assistance: min guard for safety as pt is very impulsive and decreased safety awareness displayed Ambulation/Gait Ambulation/Gait Assistance: 4: Min guard Ambulation Distance (Feet): 150 Feet Assistive device: None Ambulation/Gait Assistance Details: VCs safety, pacing. Unsteady at times, especially with head turns. No LOB.  Gait Pattern: Decreased stride  length;Trunk flexed    Shoulder Instructions     Exercises     PT Diagnosis: Difficulty walking;Altered mental status  PT Problem List: Decreased mobility;Decreased safety awareness;Decreased cognition PT Treatment Interventions: Gait training;Functional mobility training;Therapeutic activities;Therapeutic exercise;Patient/family education   PT Goals Acute Rehab PT Goals PT Goal Formulation: With patient Time For Goal Achievement: 03/30/12 Potential to Achieve Goals: Good Pt will go Supine/Side to Sit: with modified independence PT Goal: Supine/Side to Sit - Progress: Goal set today Pt will go Sit to Supine/Side: with modified independence PT Goal: Sit to Supine/Side - Progress: Goal set today Pt will go Sit to Stand: with modified independence PT Goal: Sit to Stand - Progress: Goal set today Pt will Ambulate: >150 feet;with modified independence PT Goal: Ambulate - Progress: Goal set today  Visit Information  Last PT Received On: 03/16/12 Assistance Needed: +1 PT/OT Co-Evaluation/Treatment: Yes    Subjective Data  Subjective: "Do you have a phone?" Patient Stated Goal: None stated   Prior Functioning  Home Living Lives With: Significant other Bathroom Shower/Tub: Tub/shower unit Bathroom Toilet: Standard Home Adaptive Equipment: None Additional Comments: pt doesnt use a device inside her home. uses a shopping cart to hold to when she goes out.  Prior Function Level of Independence: Needs assistance (uses a shopping cart when she goes out to help balance. ) Needs Assistance: Meal Prep;Light Housekeeping Meal Prep: Total (boyfriend does) Light Housekeeping: Total Communication Communication: No difficulties Dominant Hand: Right    Cognition  Overall Cognitive Status: History of cognitive impairments - at baseline Arousal/Alertness: Awake/alert Orientation Level: Appears intact for tasks assessed Behavior During Session: Agitated Cognition - Other Comments:  pt is  impulsive and displays decreased safety awareness (ie awareness of IV line, etc)    Extremity/Trunk Assessment Right Upper Extremity Assessment RUE ROM/Strength/Tone: Unable to fully assess;Due to pain (pt states R UE is sore. Able to move UE at all joints. ) Left Upper Extremity Assessment LUE ROM/Strength/Tone: [facility] for tasks assessed Right Lower Extremity Assessment RLE ROM/Strength/Tone: Deficits RLE ROM/Strength/Tone Deficits: Strength at least 4/5 with functional activity Left Lower Extremity Assessment LLE ROM/Strength/Tone: Deficits LLE ROM/Strength/Tone Deficits: Strength at least 4/5 with functional activity Trunk Assessment Trunk Assessment: Normal   Balance    End of Session PT - End of Session Equipment Utilized During Treatment: Gait belt Activity Tolerance: Patient tolerated treatment well Patient left: in bed;with call bell/phone within reach;with nursing in room  GP Functional Assessment Tool Used: clinical judgement Functional Limitation: Mobility: Walking and moving around Mobility: Walking and Moving Around Current Status (W0981): At least 1 percent but less than 20 percent impaired, limited or restricted Mobility: Walking and Moving Around Goal Status [phone]): 0 percent impaired, limited or restricted   Rebeca Alert [facility] 03/16/2012, 1:45 PM [phone]

## 2012-03-22 ENCOUNTER — Emergency Department (HOSPITAL_COMMUNITY)
Admission: EM | Admit: 2012-03-22 | Discharge: 2012-03-23 | Disposition: A | Discharge status: Home / Self Care | Payer: Medicare Other | Attending: Emergency Medicine | Admitting: Emergency Medicine

## 2012-03-22 ENCOUNTER — Encounter (HOSPITAL_COMMUNITY): Payer: Self-pay | Admitting: *Deleted

## 2012-03-22 DIAGNOSIS — E669 Obesity, unspecified: Secondary | ICD-10-CM | POA: Insufficient documentation

## 2012-03-22 DIAGNOSIS — E119 Type 2 diabetes mellitus without complications: Secondary | ICD-10-CM | POA: Insufficient documentation

## 2012-03-22 DIAGNOSIS — Z8669 Personal history of other diseases of the nervous system and sense organs: Secondary | ICD-10-CM | POA: Insufficient documentation

## 2012-03-22 DIAGNOSIS — F329 Major depressive disorder, single episode, unspecified: Secondary | ICD-10-CM | POA: Insufficient documentation

## 2012-03-22 DIAGNOSIS — F411 Generalized anxiety disorder: Secondary | ICD-10-CM | POA: Insufficient documentation

## 2012-03-22 DIAGNOSIS — I10 Essential (primary) hypertension: Secondary | ICD-10-CM | POA: Insufficient documentation

## 2012-03-22 DIAGNOSIS — F3289 Other specified depressive episodes: Secondary | ICD-10-CM | POA: Insufficient documentation

## 2012-03-22 DIAGNOSIS — E785 Hyperlipidemia, unspecified: Secondary | ICD-10-CM | POA: Insufficient documentation

## 2012-03-22 DIAGNOSIS — F319 Bipolar disorder, unspecified: Secondary | ICD-10-CM | POA: Insufficient documentation

## 2012-03-22 DIAGNOSIS — Z87891 Personal history of nicotine dependence: Secondary | ICD-10-CM | POA: Insufficient documentation

## 2012-03-22 DIAGNOSIS — M25429 Effusion, unspecified elbow: Secondary | ICD-10-CM | POA: Insufficient documentation

## 2012-03-22 DIAGNOSIS — Z79899 Other long term (current) drug therapy: Secondary | ICD-10-CM | POA: Insufficient documentation
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
## 2012-03-23 ENCOUNTER — Emergency Department (HOSPITAL_COMMUNITY): Payer: Medicare Other

## 2012-03-23 MED ORDER — HYDROCODONE-ACETAMINOPHEN 5-325 MG PO TABS: ORAL_TABLET | ORAL | Status: DC

## 2012-03-23 MED ORDER — HYDROCODONE-ACETAMINOPHEN 5-325 MG PO TABS
1.0000 | ORAL_TABLET | Freq: Once | ORAL | Status: AC
  Administered 2012-03-23: 1 via ORAL
  Filled 2012-03-23: qty 1
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
## 2012-03-24 NOTE — ED Provider Notes (Signed)
Medical screening examination/treatment/procedure(s) were performed by non-physician practitioner and as supervising physician I was immediately available for consultation/collaboration.  Olivia Mackie, MD 03/24/12 2113

## 2012-03-28 ENCOUNTER — Emergency Department (HOSPITAL_COMMUNITY)
Admission: EM | Admit: 2012-03-28 | Discharge: 2012-03-28 | Disposition: A | Discharge status: Home / Self Care | Payer: Medicare Other | Attending: Emergency Medicine | Admitting: Emergency Medicine

## 2012-03-28 DIAGNOSIS — X58XXXA Exposure to other specified factors, initial encounter: Secondary | ICD-10-CM | POA: Insufficient documentation

## 2012-03-28 DIAGNOSIS — G40909 Epilepsy, unspecified, not intractable, without status epilepticus: Secondary | ICD-10-CM | POA: Insufficient documentation

## 2012-03-28 DIAGNOSIS — S59909A Unspecified injury of unspecified elbow, initial encounter: Secondary | ICD-10-CM | POA: Insufficient documentation

## 2012-03-28 DIAGNOSIS — I10 Essential (primary) hypertension: Secondary | ICD-10-CM | POA: Insufficient documentation

## 2012-03-28 DIAGNOSIS — S59919A Unspecified injury of unspecified forearm, initial encounter: Secondary | ICD-10-CM | POA: Insufficient documentation

## 2012-03-28 DIAGNOSIS — E785 Hyperlipidemia, unspecified: Secondary | ICD-10-CM | POA: Insufficient documentation

## 2012-03-28 DIAGNOSIS — F341 Dysthymic disorder: Secondary | ICD-10-CM | POA: Insufficient documentation

## 2012-03-28 DIAGNOSIS — Y929 Unspecified place or not applicable: Secondary | ICD-10-CM | POA: Insufficient documentation

## 2012-03-28 DIAGNOSIS — E669 Obesity, unspecified: Secondary | ICD-10-CM | POA: Insufficient documentation

## 2012-03-28 DIAGNOSIS — Z87891 Personal history of nicotine dependence: Secondary | ICD-10-CM | POA: Insufficient documentation

## 2012-03-28 DIAGNOSIS — S6990XA Unspecified injury of unspecified wrist, hand and finger(s), initial encounter: Secondary | ICD-10-CM | POA: Insufficient documentation

## 2012-03-28 DIAGNOSIS — F309 Manic episode, unspecified: Secondary | ICD-10-CM | POA: Insufficient documentation

## 2012-03-28 DIAGNOSIS — F1910 Other psychoactive substance abuse, uncomplicated: Secondary | ICD-10-CM | POA: Insufficient documentation

## 2012-03-28 DIAGNOSIS — E119 Type 2 diabetes mellitus without complications: Secondary | ICD-10-CM | POA: Insufficient documentation

## 2012-03-28 DIAGNOSIS — Y939 Activity, unspecified: Secondary | ICD-10-CM | POA: Insufficient documentation

## 2012-03-28 DIAGNOSIS — Z79899 Other long term (current) drug therapy: Secondary | ICD-10-CM | POA: Insufficient documentation

## 2012-03-28 MED ORDER — HYDROCODONE-ACETAMINOPHEN 5-325 MG PO TABS
2.0000 | ORAL_TABLET | Freq: Once | ORAL | Status: AC
  Administered 2012-03-28: 2 via ORAL
  Filled 2012-03-28: qty 2

## 2012-03-28 MED ORDER — HYDROCODONE-ACETAMINOPHEN 5-325 MG PO TABS: 2.0000 | ORAL_TABLET | ORAL | Status: DC | PRN

## 2012-03-28 NOTE — ED Provider Notes (Signed)
History  This chart was scribed for Emilia Beck, non-physician practitioner, working with Lyanne Co, MD by Bennett Scrape, ED Scribe. This patient was seen in room WTR6/WTR6 and the patient's care was started at 12:56 PM.  CSN: [id number]  Arrival date & time 03/28/12  1232   First MD Initiated Contact with Patient 03/28/12 1256      Chief Complaint  Patient presents with  . Elbow Injury     The history is provided by the patient. No language interpreter was used.    Lynn Palmer is a 51 y.o. female who presents to the Emergency Department complaining of 13 days of sudden onset, non-changing, constant right elbow pain described as throbbing with associated swelling after being restrained by the police in the ED for AMS. The pain is worse with bending her elbow and she reports reduced ROM secondary to pain. She was seen in the ED on 03/23/12 (5 days ago) for the same and had x-rays completed on the same day. She reports that she was diagnosed with a chipped bone in her elbow through x-ray. She was discharged with narcotic pain medication (Vicodin) and a sling and told to follow up with orthopedics. She states that she has been non-complaint with using her sling and following up with ortho but has been using ice and heat on the area with mild improvement. She denies any new injuries or worsening pain, CP, abdominal pain and SOB as associated symptoms. She has a h/o bipolar disorder, DM, HLD, HTN and depression. She is an occasional alcohol user and is a former smoker.  Past Medical History  Diagnosis Date  . Bipolar 1 disorder   . Obesity   . Diabetes mellitus   . Hyperlipidemia   . Hypertension   . Vomiting   . Depression   . Anxiety   . Substance abuse     alcohol   . Seizures     as a teenager    Past Surgical History  Procedure Date  . Tonsillectomy   . Laparoscopic gastric banding 06/11/09  . Vaginal delivery 1987    No family history on file.  History   Substance Use Topics  . Smoking status: Former Games developer  . Smokeless tobacco: Never Used  . Alcohol Use: 1.8 oz/week    3 Cans of beer per week     Comment: today 2 wine coolers    No OB history provided.  Review of Systems  Constitutional: Negative for fever and chills.  Cardiovascular: Negative for chest pain.  Gastrointestinal: Negative for abdominal pain.  Musculoskeletal: Negative for back pain.       Positive for right elbow pain  All other systems reviewed and are negative.    Allergies  Review of patient's allergies indicates no known allergies.  Home Medications   Current Outpatient Rx  Name  Route  Sig  Dispense  Refill  . AMLODIPINE BESYLATE 5 MG PO TABS   Oral   Take 5 mg by mouth daily.         . BUSPIRONE HCL 15 MG PO TABS   Oral   Take 15 mg by mouth 2 (two) times daily.         Marland Kitchen DIVALPROEX SODIUM 500 MG PO TBEC   Oral   Take 1 tablet (500 mg total) by mouth 3 (three) times daily. For mood stabilization and mania.   90 tablet   0   . HYDROCODONE-ACETAMINOPHEN 5-325 MG PO TABS  Take 1-2 tablets every 6 hours as needed for severe pain   12 tablet   0   . LOPERAMIDE HCL 2 MG PO CAPS   Oral   Take 2 mg by mouth 4 (four) times daily as needed. For runny stools         . METFORMIN HCL 500 MG PO TABS      500-1,000 mg 2 (two) times daily. Take 1 tab in am and 2 tabs to equal 1000mg  at night         . METOPROLOL TARTRATE 25 MG PO TABS   Oral   Take 50 mg by mouth 2 (two) times daily.         Marland Kitchen NORTREL 7/7/7 0.5/0.75/1-35 MG-MCG PO TABS      Daily.         Marland Kitchen PRAVASTATIN SODIUM 20 MG PO TABS   Oral   Take 20 mg by mouth daily.           . QUETIAPINE FUMARATE 200 MG PO TABS   Oral   Take 400 mg by mouth at bedtime.         Marland Kitchen QUETIAPINE FUMARATE 50 MG PO TABS   Oral   Take 50 mg by mouth 2 (two) times daily. In the morning and at lunch for anxiety and mood stability.         . TRAMADOL HCL 50 MG PO TABS   Oral    Take 50 mg by mouth every 6 (six) hours as needed. Pain         . TRAZODONE HCL 150 MG PO TABS   Oral   Take 150 mg by mouth at bedtime as needed. Sleep           Triage Vitals: BP 132/78  Pulse 74  Resp 14  SpO2 99%  LMP 03/08/2012  Physical Exam  Nursing note and vitals reviewed. Constitutional: She is oriented to person, place, and time. She appears well-developed and well-nourished. No distress.  HENT:  Head: Normocephalic and atraumatic.  Eyes: Conjunctivae normal and EOM are normal.  Neck: Neck supple. No tracheal deviation present.  Cardiovascular: Normal rate.   Pulmonary/Chest: Effort normal. No respiratory distress.  Musculoskeletal: Normal range of motion.       ROM of right elbow is limited due to pain, tenderness to palpation of the medial and lateral epicondyle, no obvious deformity  Neurological: She is alert and oriented to person, place, and time.       Bilateral grip strength and sensation is equal and intact bilaterally   Skin: Skin is warm and dry.  Psychiatric: She has a normal mood and affect. Her behavior is normal.    ED Course  Procedures (including critical care time)  DIAGNOSTIC STUDIES: Oxygen Saturation is 99% on room air, normal by my interpretation.    COORDINATION OF CARE: 1:13 PM- Advised pt to wear her sling. Discussed treatment plan which includes pain medication and following up with ortho with pt at bedside and pt agreed to plan.  Labs Reviewed - No data to display No results found.   1. Elbow injury       MDM  No imaging required at this time due to no other injury or worsening pain. Pt will have pain medication and be discharged with a short dose of prescription.     I personally performed the services described in this documentation, which was scribed in my presence. The recorded information has been reviewed and is  accurate.    Emilia Beck, PA-C 03/28/12 1323

## 2012-03-28 NOTE — ED Notes (Signed)
Was restrained by police from 1 week ago and had xrays completed here 1 week ago. Pt states her dx was a chipped bone in her elbow. Pt states it is hard for her to bend her elbow back and forth and states that it is swollen and needs assistance getting dressed.

## 2012-03-29 NOTE — ED Provider Notes (Signed)
Medical screening examination/treatment/procedure(s) were performed by non-physician practitioner and as supervising physician I was immediately available for consultation/collaboration.   Jeryn Cerney M Roxene Alviar, MD 03/29/12 0723 

## 2012-04-14 ENCOUNTER — Encounter (INDEPENDENT_AMBULATORY_CARE_PROVIDER_SITE_OTHER): Payer: Medicare Other

## 2012-04-24 ENCOUNTER — Emergency Department (HOSPITAL_COMMUNITY)
Admission: EM | Admit: 2012-04-24 | Discharge: 2012-04-25 | Disposition: A | Discharge status: Home / Self Care | Payer: Medicare Other | Attending: Emergency Medicine | Admitting: Emergency Medicine

## 2012-04-24 ENCOUNTER — Encounter (HOSPITAL_COMMUNITY): Payer: Self-pay | Admitting: Emergency Medicine

## 2012-04-24 DIAGNOSIS — F411 Generalized anxiety disorder: Secondary | ICD-10-CM | POA: Insufficient documentation

## 2012-04-24 DIAGNOSIS — F329 Major depressive disorder, single episode, unspecified: Secondary | ICD-10-CM | POA: Insufficient documentation

## 2012-04-24 DIAGNOSIS — E119 Type 2 diabetes mellitus without complications: Secondary | ICD-10-CM | POA: Insufficient documentation

## 2012-04-24 DIAGNOSIS — F1010 Alcohol abuse, uncomplicated: Secondary | ICD-10-CM | POA: Insufficient documentation

## 2012-04-24 DIAGNOSIS — Z79899 Other long term (current) drug therapy: Secondary | ICD-10-CM | POA: Insufficient documentation

## 2012-04-24 DIAGNOSIS — E785 Hyperlipidemia, unspecified: Secondary | ICD-10-CM | POA: Insufficient documentation

## 2012-04-24 DIAGNOSIS — T43502A Poisoning by unspecified antipsychotics and neuroleptics, intentional self-harm, initial encounter: Secondary | ICD-10-CM | POA: Insufficient documentation

## 2012-04-24 DIAGNOSIS — Z87891 Personal history of nicotine dependence: Secondary | ICD-10-CM | POA: Insufficient documentation

## 2012-04-24 DIAGNOSIS — F319 Bipolar disorder, unspecified: Secondary | ICD-10-CM

## 2012-04-24 DIAGNOSIS — I10 Essential (primary) hypertension: Secondary | ICD-10-CM | POA: Insufficient documentation

## 2012-04-24 DIAGNOSIS — E669 Obesity, unspecified: Secondary | ICD-10-CM | POA: Insufficient documentation

## 2012-04-24 DIAGNOSIS — F3289 Other specified depressive episodes: Secondary | ICD-10-CM | POA: Insufficient documentation

## 2012-04-24 DIAGNOSIS — Z8669 Personal history of other diseases of the nervous system and sense organs: Secondary | ICD-10-CM | POA: Insufficient documentation

## 2012-04-24 LAB — URINALYSIS, ROUTINE W REFLEX MICROSCOPIC
Bilirubin Urine: NEGATIVE
Glucose, UA: NEGATIVE mg/dL
Hgb urine dipstick: NEGATIVE
Specific Gravity, Urine: 1.019 (ref 1.005–1.030)
Urobilinogen, UA: 0.2 mg/dL (ref 0.0–1.0)

## 2012-04-24 LAB — RAPID URINE DRUG SCREEN, HOSP PERFORMED
Barbiturates: NOT DETECTED
Benzodiazepines: POSITIVE — AB
Cocaine: NOT DETECTED
Opiates: NOT DETECTED

## 2012-04-24 LAB — POCT I-STAT, CHEM 8
BUN: 23 mg/dL (ref 6–23)
Calcium, Ion: 1.17 mmol/L (ref 1.12–1.23)
Chloride: 106 mEq/L (ref 96–112)
Creatinine, Ser: 1.4 mg/dL — ABNORMAL HIGH (ref 0.50–1.10)
Glucose, Bld: 150 mg/dL — ABNORMAL HIGH (ref 70–99)
TCO2: 24 mmol/L (ref 0–100)

## 2012-04-24 LAB — ETHANOL: Alcohol, Ethyl (B): <11 mg/dL (ref 0–11)

## 2012-04-24 LAB — ACETAMINOPHEN LEVEL: Acetaminophen (Tylenol), Serum: <15 ug/mL (ref 10–30)

## 2012-04-24 LAB — SALICYLATE LEVEL: Salicylate Lvl: <2 mg/dL — ABNORMAL LOW (ref 2.8–20.0)

## 2012-04-24 MED ORDER — METOPROLOL TARTRATE 25 MG PO TABS
50.0000 mg | ORAL_TABLET | Freq: Two times a day (BID) | ORAL | Status: DC
  Administered 2012-04-25: 50 mg via ORAL
  Filled 2012-04-24: qty 2

## 2012-04-24 MED ORDER — ALUM & MAG HYDROXIDE-SIMETH 200-200-20 MG/5ML PO SUSP: 30.0000 mL | ORAL | Status: DC | PRN

## 2012-04-24 MED ORDER — IBUPROFEN 600 MG PO TABS: 600.0000 mg | ORAL_TABLET | Freq: Three times a day (TID) | ORAL | Status: DC | PRN

## 2012-04-24 MED ORDER — ZOLPIDEM TARTRATE 5 MG PO TABS: 5.0000 mg | ORAL_TABLET | Freq: Every evening | ORAL | Status: DC | PRN

## 2012-04-24 MED ORDER — TRAZODONE HCL 50 MG PO TABS: 150.0000 mg | ORAL_TABLET | Freq: Every evening | ORAL | Status: DC | PRN

## 2012-04-24 MED ORDER — TRAMADOL HCL 50 MG PO TABS: 50.0000 mg | ORAL_TABLET | Freq: Four times a day (QID) | ORAL | Status: DC | PRN

## 2012-04-24 MED ORDER — LORAZEPAM 1 MG PO TABS: 1.0000 mg | ORAL_TABLET | Freq: Three times a day (TID) | ORAL | Status: DC | PRN

## 2012-04-24 MED ORDER — METFORMIN HCL 500 MG PO TABS: 500.0000 mg | ORAL_TABLET | Freq: Two times a day (BID) | ORAL | Status: DC

## 2012-04-24 MED ORDER — ONDANSETRON HCL 4 MG PO TABS: 4.0000 mg | ORAL_TABLET | Freq: Three times a day (TID) | ORAL | Status: DC | PRN

## 2012-04-24 MED ORDER — AMLODIPINE BESYLATE 5 MG PO TABS
5.0000 mg | ORAL_TABLET | Freq: Every day | ORAL | Status: DC
  Filled 2012-04-24: qty 1

## 2012-04-24 MED ORDER — SIMVASTATIN 10 MG PO TABS
10.0000 mg | ORAL_TABLET | Freq: Every day | ORAL | Status: DC
  Filled 2012-04-24: qty 1

## 2012-04-24 MED ORDER — NORETHIN-ETH ESTRAD TRIPHASIC 0.5/0.75/1-35 MG-MCG PO TABS: 1.0000 | ORAL_TABLET | Freq: Every day | ORAL | Status: DC

## 2012-04-24 MED ORDER — DIVALPROEX SODIUM 500 MG PO DR TAB
500.0000 mg | DELAYED_RELEASE_TABLET | Freq: Three times a day (TID) | ORAL | Status: DC
  Administered 2012-04-25: 500 mg via ORAL
  Filled 2012-04-24: qty 1

## 2012-04-24 MED ORDER — NICOTINE 21 MG/24HR TD PT24
21.0000 mg | MEDICATED_PATCH | Freq: Every day | TRANSDERMAL | Status: DC
  Administered 2012-04-24: 21 mg via TRANSDERMAL
  Filled 2012-04-24: qty 1

## 2012-04-24 NOTE — ED Notes (Signed)
Per EMS pt ingested unknown amount of Seroquel, Pt states "3" but roommates stated it was at least 12.  Per EMS there is a lengthy history of attempts.  Pt is very lethargic.  Pt responds, "i was depressed" when asked why she took the meds.

## 2012-04-24 NOTE — ED Notes (Signed)
Pt given a sandwich and a caffeine free soda

## 2012-04-24 NOTE — ED Provider Notes (Signed)
History     CSN: [id number]  Arrival date & time 04/24/12  1505   First MD Initiated Contact with Patient 04/24/12 1508      Chief Complaint  Patient presents with  . Drug Overdose     HPI Per EMS pt ingested unknown amount of Seroquel, Pt states "3" but roommates stated it was at least 12. Per EMS there is a lengthy history of attempts. Pt is very lethargic. Pt responds, "i was depressed" when asked why she took the meds.  Past Medical History  Diagnosis Date  . Bipolar 1 disorder   . Obesity   . Diabetes mellitus   . Hyperlipidemia   . Hypertension   . Vomiting   . Depression   . Anxiety   . Substance abuse     alcohol   . Seizures     as a teenager    Past Surgical History  Procedure Date  . Tonsillectomy   . Laparoscopic gastric banding 06/11/09  . Vaginal delivery 1987    No family history on file.  History  Substance Use Topics  . Smoking status: Former Games developer  . Smokeless tobacco: Never Used  . Alcohol Use: 1.8 oz/week    3 Cans of beer per week     Comment: today 2 wine coolers    OB History    Grav Para Term Preterm Abortions TAB SAB Ect Mult Living                  Review of Systems All other systems reviewed and are negative Allergies  Review of patient's allergies indicates no known allergies.  Home Medications   Current Outpatient Rx  Name  Route  Sig  Dispense  Refill  . AMLODIPINE BESYLATE 5 MG PO TABS   Oral   Take 5 mg by mouth daily.         . BUSPIRONE HCL 15 MG PO TABS   Oral   Take 15 mg by mouth 2 (two) times daily.         Marland Kitchen DIVALPROEX SODIUM 500 MG PO TBEC   Oral   Take 1 tablet (500 mg total) by mouth 3 (three) times daily. For mood stabilization and mania.   90 tablet   0   . HYDROCODONE-ACETAMINOPHEN 5-325 MG PO TABS      Take 1-2 tablets every 6 hours as needed for severe pain   12 tablet   0   . HYDROCODONE-ACETAMINOPHEN 5-325 MG PO TABS   Oral   Take 2 tablets by mouth every 4 (four) hours as  needed for pain.   6 tablet   0   . METFORMIN HCL 500 MG PO TABS   Oral   Take 500 mg by mouth 2 (two) times daily.          Marland Kitchen METOPROLOL TARTRATE 25 MG PO TABS   Oral   Take 50 mg by mouth 2 (two) times daily.         Marland Kitchen NORTREL 7/7/7 0.5/0.75/1-35 MG-MCG PO TABS      Daily.         Marland Kitchen PRAVASTATIN SODIUM 20 MG PO TABS   Oral   Take 20 mg by mouth daily.           . QUETIAPINE FUMARATE 200 MG PO TABS   Oral   Take 400 mg by mouth at bedtime.         . TRAMADOL HCL 50 MG PO TABS  Oral   Take 50 mg by mouth every 6 (six) hours as needed. Pain         . TRAZODONE HCL 150 MG PO TABS   Oral   Take 150 mg by mouth at bedtime as needed. Sleep           BP 149/85  Pulse 71  Temp 98.4 F (36.9 C) (Oral)  Resp 20  SpO2 97%  Physical Exam  Nursing note and vitals reviewed. Constitutional: She appears well-developed and well-nourished. She appears lethargic. She is sleeping. No distress.  HENT:  Head: Normocephalic and atraumatic.  Eyes: Pupils are equal, round, and reactive to light.       Mioses is present bilaterally  Neck: Normal range of motion.  Cardiovascular: Normal rate and intact distal pulses.   Pulmonary/Chest: No respiratory distress.  Abdominal: Normal appearance. She exhibits no distension. There is no tenderness. There is no rebound.  Musculoskeletal: Normal range of motion.  Neurological: She appears lethargic. No cranial nerve deficit.  Skin: Skin is warm and dry. No rash noted.  Psychiatric: Her behavior is normal. Her affect is blunt. Her speech is slurred. She expresses suicidal ideation.    ED Course  Procedures (including critical care time)  Labs Reviewed  SALICYLATE LEVEL - Abnormal; Notable for the following:    Salicylate Lvl <2.0 (*)     All other components within normal limits  URINE RAPID DRUG SCREEN (HOSP PERFORMED) - Abnormal; Notable for the following:    Benzodiazepines POSITIVE (*)     Tetrahydrocannabinol  POSITIVE (*)     All other components within normal limits  POCT I-STAT, CHEM 8 - Abnormal; Notable for the following:    Creatinine, Ser 1.40 (*)     Glucose, Bld 150 (*)     Hemoglobin 11.6 (*)     HCT 34.0 (*)     All other components within normal limits  GLUCOSE, CAPILLARY - Abnormal; Notable for the following:    Glucose-Capillary 103 (*)     All other components within normal limits  URINALYSIS, ROUTINE W REFLEX MICROSCOPIC  ACETAMINOPHEN LEVEL  ETHANOL   No results found.   1. Bipolar 1 disorder       MDM         Nelia Shi, MD 04/25/12 1322

## 2012-04-24 NOTE — ED Notes (Signed)
Poison Control called to get an update on pt's status. Going by pt's lab results no further recommendations were given at this time.

## 2012-04-24 NOTE — ED Notes (Addendum)
Poison Control notified of ingestion.  Spoke with [facility] Standard Recommendations: EKG, Acetaminophen level, keep monitoring QTc >500 then need to check K+ and Mag level.   Pt will be lethargic, monitor for at least 6 hrs. Seizures are possible, treat with Benzo.

## 2012-04-25 ENCOUNTER — Encounter (HOSPITAL_COMMUNITY): Payer: Self-pay

## 2012-04-25 NOTE — ED Notes (Signed)
Writer gave caffeine free coke, crackers, and cheese

## 2012-04-25 NOTE — ED Notes (Signed)
Telepsych faxed and called into consult.

## 2012-04-25 NOTE — ED Notes (Signed)
Pt riding bus home.  Per MD, ok to keep until bus runs.

## 2012-04-25 NOTE — ED Provider Notes (Signed)
2:54 AM Dr. Jacky Kindle, telepsychiatrist, deems patient safe for discharge home.   Hanley Seamen, MD 04/25/12 [phone]

## 2012-05-02 ENCOUNTER — Emergency Department (HOSPITAL_COMMUNITY): Payer: Medicare Other

## 2012-05-02 ENCOUNTER — Emergency Department (HOSPITAL_COMMUNITY)
Admission: EM | Admit: 2012-05-02 | Discharge: 2012-05-02 | Disposition: A | Discharge status: Home / Self Care | Payer: Medicare Other | Attending: Emergency Medicine | Admitting: Emergency Medicine

## 2012-05-02 ENCOUNTER — Encounter (HOSPITAL_COMMUNITY): Payer: Self-pay | Admitting: *Deleted

## 2012-05-02 DIAGNOSIS — F3289 Other specified depressive episodes: Secondary | ICD-10-CM | POA: Insufficient documentation

## 2012-05-02 DIAGNOSIS — F329 Major depressive disorder, single episode, unspecified: Secondary | ICD-10-CM | POA: Insufficient documentation

## 2012-05-02 DIAGNOSIS — F319 Bipolar disorder, unspecified: Secondary | ICD-10-CM | POA: Insufficient documentation

## 2012-05-02 DIAGNOSIS — E669 Obesity, unspecified: Secondary | ICD-10-CM | POA: Insufficient documentation

## 2012-05-02 DIAGNOSIS — E860 Dehydration: Secondary | ICD-10-CM

## 2012-05-02 DIAGNOSIS — R112 Nausea with vomiting, unspecified: Secondary | ICD-10-CM | POA: Insufficient documentation

## 2012-05-02 DIAGNOSIS — K5900 Constipation, unspecified: Secondary | ICD-10-CM

## 2012-05-02 DIAGNOSIS — Z3202 Encounter for pregnancy test, result negative: Secondary | ICD-10-CM | POA: Insufficient documentation

## 2012-05-02 DIAGNOSIS — R1110 Vomiting, unspecified: Secondary | ICD-10-CM

## 2012-05-02 DIAGNOSIS — Z8669 Personal history of other diseases of the nervous system and sense organs: Secondary | ICD-10-CM | POA: Insufficient documentation

## 2012-05-02 DIAGNOSIS — E785 Hyperlipidemia, unspecified: Secondary | ICD-10-CM | POA: Insufficient documentation

## 2012-05-02 DIAGNOSIS — Z79899 Other long term (current) drug therapy: Secondary | ICD-10-CM | POA: Insufficient documentation

## 2012-05-02 DIAGNOSIS — F411 Generalized anxiety disorder: Secondary | ICD-10-CM | POA: Insufficient documentation

## 2012-05-02 DIAGNOSIS — Z87891 Personal history of nicotine dependence: Secondary | ICD-10-CM | POA: Insufficient documentation

## 2012-05-02 DIAGNOSIS — E119 Type 2 diabetes mellitus without complications: Secondary | ICD-10-CM | POA: Insufficient documentation

## 2012-05-02 DIAGNOSIS — R109 Unspecified abdominal pain: Secondary | ICD-10-CM

## 2012-05-02 LAB — COMPREHENSIVE METABOLIC PANEL
ALT: 8 U/L (ref 0–35)
CO2: 22 mEq/L (ref 19–32)
Calcium: 8.9 mg/dL (ref 8.4–10.5)
Creatinine, Ser: 1.33 mg/dL — ABNORMAL HIGH (ref 0.50–1.10)
GFR calc Af Amer: 53 mL/min — ABNORMAL LOW (ref >90)
GFR calc non Af Amer: 45 mL/min — ABNORMAL LOW (ref >90)
Glucose, Bld: 137 mg/dL — ABNORMAL HIGH (ref 70–99)
Total Bilirubin: 0.3 mg/dL (ref 0.3–1.2)

## 2012-05-02 LAB — CBC WITH DIFFERENTIAL/PLATELET
Eosinophils Relative: 2 % (ref 0–5)
HCT: 36.7 % (ref 36.0–46.0)
Hemoglobin: 12.2 g/dL (ref 12.0–15.0)
Lymphocytes Relative: 39 % (ref 12–46)
Lymphs Abs: 3.2 10*3/uL (ref 0.7–4.0)
MCV: 85.5 fL (ref 78.0–100.0)
Monocytes Absolute: 0.7 10*3/uL (ref 0.1–1.0)
RBC: 4.29 MIL/uL (ref 3.87–5.11)
WBC: 8.2 10*3/uL (ref 4.0–10.5)

## 2012-05-02 LAB — URINE MICROSCOPIC-ADD ON

## 2012-05-02 LAB — URINALYSIS, ROUTINE W REFLEX MICROSCOPIC
Hgb urine dipstick: NEGATIVE
Specific Gravity, Urine: 1.028 (ref 1.005–1.030)
Urobilinogen, UA: 0.2 mg/dL (ref 0.0–1.0)
pH: 5.5 (ref 5.0–8.0)

## 2012-05-02 MED ORDER — SODIUM CHLORIDE 0.9 % IV BOLUS (SEPSIS)
1000.0000 mL | Freq: Once | INTRAVENOUS | Status: AC
  Administered 2012-05-02: 1000 mL via INTRAVENOUS

## 2012-05-02 MED ORDER — MORPHINE SULFATE 4 MG/ML IJ SOLN
4.0000 mg | Freq: Once | INTRAMUSCULAR | Status: AC
  Administered 2012-05-02: 4 mg via INTRAVENOUS
  Filled 2012-05-02: qty 1

## 2012-05-02 MED ORDER — HYDROCODONE-ACETAMINOPHEN 5-325 MG PO TABS: 2.0000 | ORAL_TABLET | ORAL | Status: DC | PRN

## 2012-05-02 MED ORDER — PROMETHAZINE HCL 25 MG PO TABS: 25.0000 mg | ORAL_TABLET | Freq: Four times a day (QID) | ORAL | Status: DC | PRN

## 2012-05-02 MED ORDER — PROMETHAZINE HCL 25 MG/ML IJ SOLN
25.0000 mg | Freq: Once | INTRAMUSCULAR | Status: AC
  Administered 2012-05-02: 25 mg via INTRAVENOUS
  Filled 2012-05-02 (×2): qty 1

## 2012-05-02 NOTE — ED Notes (Signed)
The pt has had generalized abd pain since yesterday with n v and diarrhea.  She has a lap band and shes afraid that something has slipped.  lmp 3 weeks ago.  She also has flank pain

## 2012-05-02 NOTE — ED Notes (Signed)
The patient is tolerating po fluids and food.

## 2012-05-02 NOTE — ED Provider Notes (Signed)
History     CSN: [id number]  Arrival date & time 05/02/12  4098   First MD Initiated Contact with Patient 05/02/12 2120      Chief Complaint  Patient presents with  . Abdominal Pain    (Consider location/radiation/quality/duration/timing/severity/associated sxs/prior treatment) The history is provided by the patient.  Lynn Palmer is a 52 y.o. female history of bipolar, diabetes, hyperlipidemia, lap band here with right-sided abdominal pain and vomiting. Nausea and vomiting for the last 2 days. She also had lower abdominal pain worse on the right side. Denies any fevers. She was concerned about her lap band migrating. Has similar symptoms in 2012 and had normal CT and was sent home on Norco and Phenergan.   Past Medical History  Diagnosis Date  . Bipolar 1 disorder   . Obesity   . Diabetes mellitus   . Hyperlipidemia   . Hypertension   . Vomiting   . Depression   . Anxiety   . Substance abuse     alcohol   . Seizures     as a teenager    Past Surgical History  Procedure Date  . Tonsillectomy   . Laparoscopic gastric banding 06/11/09  . Vaginal delivery 1987    No family history on file.  History  Substance Use Topics  . Smoking status: Former Games developer  . Smokeless tobacco: Never Used  . Alcohol Use: 1.8 oz/week    3 Cans of beer per week     Comment: today 2 wine coolers    OB History    Grav Para Term Preterm Abortions TAB SAB Ect Mult Living                  Review of Systems  Gastrointestinal: Positive for nausea, vomiting, abdominal pain and constipation.  All other systems reviewed and are negative.    Allergies  Review of patient's allergies indicates no known allergies.  Home Medications   Current Outpatient Rx  Name  Route  Sig  Dispense  Refill  . AMLODIPINE BESYLATE 5 MG PO TABS   Oral   Take 5 mg by mouth daily.         . BUSPIRONE HCL 15 MG PO TABS   Oral   Take 15 mg by mouth 3 (three) times daily.          Marland Kitchen DIVALPROEX  SODIUM 500 MG PO TBEC   Oral   Take 1 tablet (500 mg total) by mouth 3 (three) times daily. For mood stabilization and mania.   90 tablet   0   . MELATONIN ER 10 MG PO TBCR   Oral   Take 10 mg by mouth at bedtime as needed.         Marland Kitchen METFORMIN HCL 500 MG PO TABS   Oral   Take 500 mg by mouth 2 (two) times daily.          Marland Kitchen METOPROLOL TARTRATE 50 MG PO TABS   Oral   Take 50 mg by mouth 2 (two) times daily.         Marland Kitchen PRAVASTATIN SODIUM 20 MG PO TABS   Oral   Take 20 mg by mouth every evening.          Marland Kitchen QUETIAPINE FUMARATE 200 MG PO TABS   Oral   Take 400 mg by mouth at bedtime.         . TRAMADOL HCL 50 MG PO TABS   Oral   Take  50 mg by mouth every 8 (eight) hours as needed. For pain         . TRAZODONE HCL 150 MG PO TABS   Oral   Take 150 mg by mouth at bedtime as needed. For Sleep         . NORTREL 7/7/7 0.5/0.75/1-35 MG-MCG PO TABS   Oral   Take 1 tablet by mouth Daily.            BP 131/65  Pulse 68  Temp 98.5 F (36.9 C) (Oral)  Resp 16  SpO2 100%  LMP 04/18/2012  Physical Exam  Nursing note and vitals reviewed. Constitutional: She is oriented to person, place, and time. She appears well-developed and well-nourished.  HENT:  Head: Normocephalic.       MM slightly dry   Eyes: Conjunctivae normal are normal. Pupils are equal, round, and reactive to light.  Neck: Normal range of motion. Neck supple.  Cardiovascular: Normal rate, regular rhythm and normal heart sounds.   Pulmonary/Chest: Effort normal and breath sounds normal. No respiratory distress. She has no wheezes. She has no rales.  Abdominal: Soft.       Mild RLQ tenderness, no rebound.   Musculoskeletal: Normal range of motion.  Neurological: She is alert and oriented to person, place, and time.  Skin: Skin is warm and dry.  Psychiatric: She has a normal mood and affect. Her behavior is normal. Judgment and thought content normal.    ED Course  Procedures (including critical  care time)  Labs Reviewed  URINALYSIS, ROUTINE W REFLEX MICROSCOPIC - Abnormal; Notable for the following:    Color, Urine AMBER (*)  BIOCHEMICALS MAY BE AFFECTED BY COLOR   APPearance HAZY (*)     Bilirubin Urine SMALL (*)     Ketones, ur 15 (*)     Protein, ur 30 (*)     Leukocytes, UA SMALL (*)     All other components within normal limits  COMPREHENSIVE METABOLIC PANEL - Abnormal; Notable for the following:    Glucose, Bld 137 (*)     BUN 24 (*)     Creatinine, Ser 1.33 (*)     Albumin 3.1 (*)     GFR calc non Af Amer 45 (*)     GFR calc Af Amer 53 (*)     All other components within normal limits  URINE MICROSCOPIC-ADD ON - Abnormal; Notable for the following:    Squamous Epithelial / LPF MANY (*)     Bacteria, UA MANY (*)     Casts HYALINE CASTS (*)     All other components within normal limits  PREGNANCY, URINE  CBC WITH DIFFERENTIAL  LIPASE, BLOOD  URINE CULTURE   Dg Abd Acute W/chest  05/02/2012  *RADIOLOGY REPORT*  Clinical Data: Vomiting and abdominal pain.  ACUTE ABDOMEN SERIES (ABDOMEN 2 VIEW & CHEST 1 VIEW)  Comparison: 12/18/2011  Findings: The chest radiograph demonstrates clear lungs.  Stable appearance of the heart and mediastinum.  No evidence for free air. There is a gastric lap band.  The lap band appears to be in a stable position.  A large of amount stool in the right abdomen. Nonobstructive bowel gas pattern.  IMPRESSION: No acute chest findings.  Nonobstructive bowel gas pattern.  Large amount of stool in the right abdomen.  Stable appearance of the gastric lap band.   Original Report Authenticated By: Richarda Overlie, M.D.      No diagnosis found.    MDM  Lynn Palmer is a 52 y.o. female here with ab pain, vomiting. Ab xray showed large stool in r abdomen. Gastric band is stable appearance. Her WBC is nl and her history is atypical for appendicitis. I think her RLQ tenderness is likely from the large stool in R abdomen. Will give IVF and pain meds and  phenergan and reassess.   11:06 PM Abdominal pain improved. Tolerated PO. Abdomen minimally tender on R side now with no rebound. Likely from constipation. Will d/c home on norco, phenergan, and miralax.         Richardean Canal, MD 05/02/12 [phone]

## 2012-05-04 LAB — URINE CULTURE: Colony Count: >=100000

## 2012-05-09 ENCOUNTER — Emergency Department (HOSPITAL_COMMUNITY): Payer: Medicare Other

## 2012-05-09 ENCOUNTER — Emergency Department (HOSPITAL_COMMUNITY)
Admission: EM | Admit: 2012-05-09 | Discharge: 2012-05-09 | Disposition: A | Discharge status: Home / Self Care | Payer: Medicare Other | Attending: Emergency Medicine | Admitting: Emergency Medicine

## 2012-05-09 ENCOUNTER — Encounter (HOSPITAL_COMMUNITY): Payer: Self-pay

## 2012-05-09 DIAGNOSIS — E119 Type 2 diabetes mellitus without complications: Secondary | ICD-10-CM | POA: Insufficient documentation

## 2012-05-09 DIAGNOSIS — E669 Obesity, unspecified: Secondary | ICD-10-CM | POA: Insufficient documentation

## 2012-05-09 DIAGNOSIS — F411 Generalized anxiety disorder: Secondary | ICD-10-CM | POA: Insufficient documentation

## 2012-05-09 DIAGNOSIS — N390 Urinary tract infection, site not specified: Secondary | ICD-10-CM | POA: Insufficient documentation

## 2012-05-09 DIAGNOSIS — R112 Nausea with vomiting, unspecified: Secondary | ICD-10-CM | POA: Insufficient documentation

## 2012-05-09 DIAGNOSIS — I10 Essential (primary) hypertension: Secondary | ICD-10-CM | POA: Insufficient documentation

## 2012-05-09 DIAGNOSIS — Z9884 Bariatric surgery status: Secondary | ICD-10-CM | POA: Insufficient documentation

## 2012-05-09 DIAGNOSIS — M545 Low back pain, unspecified: Secondary | ICD-10-CM | POA: Insufficient documentation

## 2012-05-09 DIAGNOSIS — E785 Hyperlipidemia, unspecified: Secondary | ICD-10-CM | POA: Insufficient documentation

## 2012-05-09 DIAGNOSIS — J3489 Other specified disorders of nose and nasal sinuses: Secondary | ICD-10-CM | POA: Insufficient documentation

## 2012-05-09 DIAGNOSIS — Z3202 Encounter for pregnancy test, result negative: Secondary | ICD-10-CM | POA: Insufficient documentation

## 2012-05-09 DIAGNOSIS — F3289 Other specified depressive episodes: Secondary | ICD-10-CM | POA: Insufficient documentation

## 2012-05-09 DIAGNOSIS — R197 Diarrhea, unspecified: Secondary | ICD-10-CM | POA: Insufficient documentation

## 2012-05-09 DIAGNOSIS — R109 Unspecified abdominal pain: Secondary | ICD-10-CM

## 2012-05-09 DIAGNOSIS — Z79899 Other long term (current) drug therapy: Secondary | ICD-10-CM | POA: Insufficient documentation

## 2012-05-09 DIAGNOSIS — F309 Manic episode, unspecified: Secondary | ICD-10-CM | POA: Insufficient documentation

## 2012-05-09 DIAGNOSIS — F1010 Alcohol abuse, uncomplicated: Secondary | ICD-10-CM | POA: Insufficient documentation

## 2012-05-09 DIAGNOSIS — Z87891 Personal history of nicotine dependence: Secondary | ICD-10-CM | POA: Insufficient documentation

## 2012-05-09 DIAGNOSIS — F329 Major depressive disorder, single episode, unspecified: Secondary | ICD-10-CM | POA: Insufficient documentation

## 2012-05-09 LAB — URINE MICROSCOPIC-ADD ON

## 2012-05-09 LAB — CBC WITH DIFFERENTIAL/PLATELET
Basophils Absolute: 0 10*3/uL (ref 0.0–0.1)
Basophils Relative: 0 % (ref 0–1)
Eosinophils Absolute: 0.2 10*3/uL (ref 0.0–0.7)
Eosinophils Relative: 3 % (ref 0–5)
HCT: 36.1 % (ref 36.0–46.0)
MCH: 29.4 pg (ref 26.0–34.0)
MCHC: 33.8 g/dL (ref 30.0–36.0)
Monocytes Absolute: 1 10*3/uL (ref 0.1–1.0)
Monocytes Relative: 10 % (ref 3–12)
Neutro Abs: 5.6 10*3/uL (ref 1.7–7.7)
RDW: 15.3 % (ref 11.5–15.5)

## 2012-05-09 LAB — URINALYSIS, ROUTINE W REFLEX MICROSCOPIC
Glucose, UA: NEGATIVE mg/dL
Hgb urine dipstick: NEGATIVE
Protein, ur: NEGATIVE mg/dL
Specific Gravity, Urine: 1.035 — ABNORMAL HIGH (ref 1.005–1.030)
Urobilinogen, UA: 1 mg/dL (ref 0.0–1.0)

## 2012-05-09 LAB — COMPREHENSIVE METABOLIC PANEL
ALT: 10 U/L (ref 0–35)
AST: 15 U/L (ref 0–37)
CO2: 25 mEq/L (ref 19–32)
Chloride: 102 mEq/L (ref 96–112)
GFR calc non Af Amer: 66 mL/min — ABNORMAL LOW (ref >90)
Sodium: 137 mEq/L (ref 135–145)
Total Bilirubin: 0.2 mg/dL — ABNORMAL LOW (ref 0.3–1.2)

## 2012-05-09 MED ORDER — NITROFURANTOIN MONOHYD MACRO 100 MG PO CAPS: 100.0000 mg | ORAL_CAPSULE | Freq: Two times a day (BID) | ORAL | Status: DC

## 2012-05-09 MED ORDER — ONDANSETRON HCL 4 MG/2ML IJ SOLN
4.0000 mg | Freq: Once | INTRAMUSCULAR | Status: AC
  Administered 2012-05-09: 4 mg via INTRAVENOUS
  Filled 2012-05-09: qty 2

## 2012-05-09 MED ORDER — KETOROLAC TROMETHAMINE 30 MG/ML IJ SOLN
30.0000 mg | Freq: Once | INTRAMUSCULAR | Status: AC
  Administered 2012-05-09: 30 mg via INTRAVENOUS
  Filled 2012-05-09: qty 1

## 2012-05-09 MED ORDER — OXYCODONE-ACETAMINOPHEN 5-325 MG PO TABS: 1.0000 | ORAL_TABLET | Freq: Four times a day (QID) | ORAL | Status: DC | PRN

## 2012-05-09 NOTE — ED Provider Notes (Signed)
History     CSN: [id number]  Arrival date & time 05/09/12  7846   First MD Initiated Contact with Patient 05/09/12 [phone]      Chief Complaint  Patient presents with  . Abdominal Pain  . Emesis    (Consider location/radiation/quality/duration/timing/severity/associated sxs/prior treatment) HPI Comments: Lynn Palmer was seen in the [facility] ER on 1/20.  She reports having abdominal discomfort, nausea, vomiting, and back pain then.  She reports the symptoms improved after taking the medication for a "few" days, but have since returned.  She denies fever, but reports feeling ill.  She describes cramping abdominal discomfort and aching lower back pain.  Patient is a 52 y.o. female presenting with abdominal pain. The history is provided by the patient. No language interpreter was used.  Abdominal Pain The primary symptoms of the illness include abdominal pain, nausea, vomiting and diarrhea. The primary symptoms of the illness do not include fever, fatigue, shortness of breath, hematochezia, dysuria or vaginal bleeding. The current episode started more than 2 days ago. The onset of the illness was gradual. The problem has not changed since onset. The patient states that she believes she is currently not pregnant. The patient has had a change in bowel habit. Risk factors for an acute abdominal problem include a history of abdominal surgery (gastric banding). Symptoms associated with the illness do not include chills, anorexia, diaphoresis, heartburn, constipation, urgency, hematuria, frequency or back pain. Significant associated medical issues include diabetes and substance abuse.    Past Medical History  Diagnosis Date  . Bipolar 1 disorder   . Obesity   . Diabetes mellitus   . Hyperlipidemia   . Hypertension   . Vomiting   . Depression   . Anxiety   . Substance abuse     alcohol   . Seizures     as a teenager    Past Surgical History  Procedure Date  . Tonsillectomy   . Laparoscopic  gastric banding 06/11/09  . Vaginal delivery 1987    Family History  Problem Relation Age of Onset  . Osteoarthritis Mother   . Heart failure Father   . Osteoarthritis Father     History  Substance Use Topics  . Smoking status: Former Games developer  . Smokeless tobacco: Never Used  . Alcohol Use: No    OB History    Grav Para Term Preterm Abortions TAB SAB Ect Mult Living                  Review of Systems  Constitutional: Positive for appetite change. Negative for fever, chills, diaphoresis and fatigue.  HENT: Positive for congestion and rhinorrhea. Negative for sore throat, trouble swallowing, neck pain, neck stiffness and sinus pressure.   Respiratory: Negative for cough, chest tightness, shortness of breath and wheezing.   Cardiovascular: Negative for chest pain, palpitations and leg swelling.  Gastrointestinal: Positive for nausea, vomiting, abdominal pain and diarrhea. Negative for heartburn, constipation, blood in stool, hematochezia and anorexia.  Genitourinary: Negative for dysuria, urgency, frequency, hematuria, flank pain, vaginal bleeding and difficulty urinating.  Musculoskeletal: Negative for myalgias, back pain and arthralgias.  Skin: Negative for pallor and wound.  Neurological: Negative for dizziness, syncope, weakness and light-headedness.  Psychiatric/Behavioral: Negative for hallucinations and confusion.    Allergies  Review of patient's allergies indicates no known allergies.  Home Medications   Current Outpatient Rx  Name  Route  Sig  Dispense  Refill  . AMLODIPINE BESYLATE 5 MG PO TABS   Oral  Take 5 mg by mouth daily.         . BUSPIRONE HCL 15 MG PO TABS   Oral   Take 15 mg by mouth 3 (three) times daily.          Marland Kitchen DIVALPROEX SODIUM 500 MG PO TBEC   Oral   Take 1 tablet (500 mg total) by mouth 3 (three) times daily. For mood stabilization and mania.   90 tablet   0   . HYDROCODONE-ACETAMINOPHEN 5-325 MG PO TABS   Oral   Take 2 tablets  by mouth every 4 (four) hours as needed for pain.   10 tablet   0   . MELATONIN ER 10 MG PO TBCR   Oral   Take 10 mg by mouth at bedtime as needed.         Marland Kitchen METFORMIN HCL 500 MG PO TABS   Oral   Take 500 mg by mouth 2 (two) times daily.          Marland Kitchen METOPROLOL TARTRATE 50 MG PO TABS   Oral   Take 50 mg by mouth 2 (two) times daily.         Marland Kitchen NORTREL 7/7/7 0.5/0.75/1-35 MG-MCG PO TABS   Oral   Take 1 tablet by mouth Daily.          Marland Kitchen PRAVASTATIN SODIUM 20 MG PO TABS   Oral   Take 20 mg by mouth every evening.          Marland Kitchen PROMETHAZINE HCL 25 MG PO TABS   Oral   Take 1 tablet (25 mg total) by mouth every 6 (six) hours as needed for nausea.   20 tablet   0   . QUETIAPINE FUMARATE 200 MG PO TABS   Oral   Take 400 mg by mouth at bedtime.         . TRAMADOL HCL 50 MG PO TABS   Oral   Take 50 mg by mouth every 8 (eight) hours as needed. For pain         . TRAZODONE HCL 150 MG PO TABS   Oral   Take 150 mg by mouth at bedtime as needed. For Sleep           BP 140/83  Pulse 69  Temp 97.7 F (36.5 C) (Oral)  Resp 16  Ht 5\' 1"  (1.549 m)  Wt 189 lb 6.4 oz (85.911 kg)  BMI 35.79 kg/m2  SpO2 100%  LMP 04/18/2012  Physical Exam  Nursing note and vitals reviewed. Constitutional: She is oriented to person, place, and time. She appears well-developed and well-nourished. No distress.  HENT:  Head: Normocephalic and atraumatic. Head is without Battle's sign, without abrasion, without laceration, without right periorbital erythema and without left periorbital erythema. No trismus in the jaw.  Nose: Rhinorrhea present. No mucosal edema. No epistaxis. Right sinus exhibits no maxillary sinus tenderness and no frontal sinus tenderness. Left sinus exhibits no maxillary sinus tenderness and no frontal sinus tenderness.  Mouth/Throat: Oropharynx is clear and moist and mucous membranes are normal. Mucous membranes are not pale, not dry and not cyanotic. No uvula swelling.    Eyes: Conjunctivae normal are normal. Pupils are equal, round, and reactive to light. Right eye exhibits no discharge. Left eye exhibits no discharge. No scleral icterus.  Neck: Normal range of motion. Neck supple. No JVD present. No tracheal deviation present.  Cardiovascular: Normal rate, regular rhythm, normal heart sounds and intact distal pulses.  Exam  reveals no gallop and no friction rub.   No murmur heard. Pulmonary/Chest: Effort normal and breath sounds normal. No stridor. No respiratory distress. She has no wheezes. She has no rales. She exhibits no tenderness.  Abdominal: Soft. She exhibits no shifting dullness, no distension, no abdominal bruit, no ascites, no pulsatile midline mass and no mass. Bowel sounds are increased. There is no hepatosplenomegaly. There is generalized tenderness. There is no rigidity, no rebound, no guarding, no CVA tenderness, no tenderness at McBurney's point and negative Murphy's sign. No hernia.  Musculoskeletal: Normal range of motion. She exhibits no edema and no tenderness.  Lymphadenopathy:    She has no cervical adenopathy.  Neurological: She is alert and oriented to person, place, and time. No cranial nerve deficit.  Skin: Skin is warm and dry. No rash noted. She is not diaphoretic. No erythema. No pallor.  Psychiatric: She has a normal mood and affect. Her behavior is normal.    ED Course  Procedures (including critical care time)   Labs Reviewed  CBC WITH DIFFERENTIAL  COMPREHENSIVE METABOLIC PANEL  URINALYSIS, ROUTINE W REFLEX MICROSCOPIC   No results found.   No diagnosis found.    MDM  Pt presents for evaluation of abdominal discomfort, nausea, lower back pain, and loose stools.  She appears nontoxic, note stable VS, NAD.  She was seen at [facility] on 1/20 and treated for similar issues.  At time of discharge, it was felt that the abdominal discomfort was likely secondary to constipation.  She was prescribed norco, phenergan, and miralax.   She currently has no evidence of peritonitis and the lower back discomfort is vague and diffuse without CVAT.  Will obtain basic belly labs and a KUB.  Will provide symptomatic care and reassess.  1255.  Pt stable, NAD.  Note an essentially nl CBC and BMP.  LFTs are wnl with the exception of the serum albumin which is 2.8.  The urinalysis is positive fora URI.  Will treat with macrobid.  A urine culture is also being processed.  Plan discharge home.       Tobin Chad, MD 05/09/12 1301

## 2012-05-09 NOTE — ED Notes (Signed)
Patient reports that she has recurrent abdominal pain, low back pain , and N/V/D, Patient was seen at [facility] last week and was told that she had constipation and was told to take Miralax. Patient states she has no more nausea and pain meds and the pain and nausea has returned.

## 2012-05-10 ENCOUNTER — Emergency Department (HOSPITAL_COMMUNITY)
Admission: EM | Admit: 2012-05-10 | Discharge: 2012-05-10 | Disposition: A | Discharge status: Home / Self Care | Payer: Medicare Other | Attending: Emergency Medicine | Admitting: Emergency Medicine

## 2012-05-10 ENCOUNTER — Encounter (HOSPITAL_COMMUNITY): Payer: Self-pay | Admitting: *Deleted

## 2012-05-10 DIAGNOSIS — E119 Type 2 diabetes mellitus without complications: Secondary | ICD-10-CM | POA: Insufficient documentation

## 2012-05-10 DIAGNOSIS — F319 Bipolar disorder, unspecified: Secondary | ICD-10-CM | POA: Insufficient documentation

## 2012-05-10 DIAGNOSIS — Z87891 Personal history of nicotine dependence: Secondary | ICD-10-CM | POA: Insufficient documentation

## 2012-05-10 DIAGNOSIS — R110 Nausea: Secondary | ICD-10-CM

## 2012-05-10 DIAGNOSIS — I10 Essential (primary) hypertension: Secondary | ICD-10-CM | POA: Insufficient documentation

## 2012-05-10 DIAGNOSIS — F411 Generalized anxiety disorder: Secondary | ICD-10-CM | POA: Insufficient documentation

## 2012-05-10 DIAGNOSIS — Z79899 Other long term (current) drug therapy: Secondary | ICD-10-CM | POA: Insufficient documentation

## 2012-05-10 DIAGNOSIS — F329 Major depressive disorder, single episode, unspecified: Secondary | ICD-10-CM | POA: Insufficient documentation

## 2012-05-10 DIAGNOSIS — R112 Nausea with vomiting, unspecified: Secondary | ICD-10-CM | POA: Insufficient documentation

## 2012-05-10 DIAGNOSIS — F3289 Other specified depressive episodes: Secondary | ICD-10-CM | POA: Insufficient documentation

## 2012-05-10 DIAGNOSIS — N390 Urinary tract infection, site not specified: Secondary | ICD-10-CM | POA: Insufficient documentation

## 2012-05-10 DIAGNOSIS — E785 Hyperlipidemia, unspecified: Secondary | ICD-10-CM | POA: Insufficient documentation

## 2012-05-10 DIAGNOSIS — E669 Obesity, unspecified: Secondary | ICD-10-CM | POA: Insufficient documentation

## 2012-05-10 LAB — POCT I-STAT, CHEM 8
BUN: 16 mg/dL (ref 6–23)
Chloride: 100 mEq/L (ref 96–112)
Creatinine, Ser: 1.2 mg/dL — ABNORMAL HIGH (ref 0.50–1.10)
Sodium: 137 mEq/L (ref 135–145)
TCO2: 28 mmol/L (ref 0–100)

## 2012-05-10 MED ORDER — ONDANSETRON HCL 4 MG/2ML IJ SOLN
4.0000 mg | Freq: Once | INTRAMUSCULAR | Status: AC
  Administered 2012-05-10: 4 mg via INTRAVENOUS
  Filled 2012-05-10: qty 2

## 2012-05-10 MED ORDER — ONDANSETRON HCL 4 MG PO TABS: 4.0000 mg | ORAL_TABLET | Freq: Three times a day (TID) | ORAL | Status: DC | PRN

## 2012-05-10 MED ORDER — SODIUM CHLORIDE 0.9 % IV BOLUS (SEPSIS)
1000.0000 mL | Freq: Once | INTRAVENOUS | Status: AC
  Administered 2012-05-10: 1000 mL via INTRAVENOUS

## 2012-05-10 NOTE — ED Notes (Signed)
Pt here complaining of nausea and vomiting.  Pt seen yesterday at [facility] dx with UTI,  Pt. advises she's started vomiting again since being seen.

## 2012-05-10 NOTE — ED Provider Notes (Signed)
History     CSN: [id number]  Arrival date & time 05/10/12  3295   First MD Initiated Contact with Patient 05/10/12 [phone]      Chief Complaint  Patient presents with  . Urinary Tract Infection  . Emesis    (Consider location/radiation/quality/duration/timing/severity/associated sxs/prior treatment) HPI Comments: 52 y.o. female presents today complaining of gradual onset nausea for 10 days. This is a chronic problem for which the pt has returned to the ED several times. The nausea is intermittent and nothing makes it better or worse. Pt was seen yesterday in ED for same. Given zofran in ED and felt better, but was not given a prescription for zofran and would like one. Pt denies any new symptoms, no vomiting today, no fever, no diarrhea, no headaches, no dizziness.  Patient is a 52 y.o. female presenting with vomiting.  Emesis  Pertinent negatives include no diarrhea, no fever and no headaches.    Past Medical History  Diagnosis Date  . Bipolar 1 disorder   . Obesity   . Diabetes mellitus   . Hyperlipidemia   . Hypertension   . Vomiting   . Depression   . Anxiety   . Substance abuse     alcohol   . Seizures     as a teenager    Past Surgical History  Procedure Date  . Tonsillectomy   . Laparoscopic gastric banding 06/11/09  . Vaginal delivery 1987    Family History  Problem Relation Age of Onset  . Osteoarthritis Mother   . Heart failure Father   . Osteoarthritis Father     History  Substance Use Topics  . Smoking status: Former Games developer  . Smokeless tobacco: Never Used  . Alcohol Use: 0.0 oz/week     Comment: social    OB History    Grav Para Term Preterm Abortions TAB SAB Ect Mult Living                  Review of Systems  Constitutional: Negative for fever and diaphoresis.  HENT: Negative for neck pain and neck stiffness.   Eyes: Negative for visual disturbance.  Respiratory: Negative for apnea, chest tightness and shortness of breath.     Cardiovascular: Negative for chest pain and palpitations.  Gastrointestinal: Positive for nausea. Negative for vomiting, diarrhea and constipation.  Genitourinary: Negative for dysuria.  Musculoskeletal: Negative for gait problem.  Skin: Negative for rash.  Neurological: Negative for dizziness, weakness, light-headedness, numbness and headaches.    Allergies  Review of patient's allergies indicates no known allergies.  Home Medications   Current Outpatient Rx  Name  Route  Sig  Dispense  Refill  . AMLODIPINE BESYLATE 5 MG PO TABS   Oral   Take 5 mg by mouth daily.         . BUSPIRONE HCL 15 MG PO TABS   Oral   Take 15 mg by mouth 3 (three) times daily.          Marland Kitchen DIVALPROEX SODIUM 500 MG PO TBEC   Oral   Take 1 tablet (500 mg total) by mouth 3 (three) times daily. For mood stabilization and mania.   90 tablet   0   . HYDROCODONE-ACETAMINOPHEN 5-325 MG PO TABS   Oral   Take 2 tablets by mouth every 4 (four) hours as needed for pain.   10 tablet   0   . MELATONIN ER 10 MG PO TBCR   Oral   Take 10 mg  by mouth at bedtime as needed.         Marland Kitchen METFORMIN HCL 500 MG PO TABS   Oral   Take 500 mg by mouth 2 (two) times daily.          Marland Kitchen METOPROLOL TARTRATE 50 MG PO TABS   Oral   Take 50 mg by mouth 2 (two) times daily.         Marland Kitchen NITROFURANTOIN MONOHYD MACRO 100 MG PO CAPS   Oral   Take 1 capsule (100 mg total) by mouth 2 (two) times daily.   10 capsule   0   . NORTREL 7/7/7 0.5/0.75/1-35 MG-MCG PO TABS   Oral   Take 1 tablet by mouth Daily.          . OXYCODONE-ACETAMINOPHEN 5-325 MG PO TABS   Oral   Take 1 tablet by mouth every 6 (six) hours as needed for pain.   11 tablet   0   . PRAVASTATIN SODIUM 20 MG PO TABS   Oral   Take 20 mg by mouth every evening.          Marland Kitchen PROMETHAZINE HCL 25 MG PO TABS   Oral   Take 1 tablet (25 mg total) by mouth every 6 (six) hours as needed for nausea.   20 tablet   0   . QUETIAPINE FUMARATE 200 MG PO  TABS   Oral   Take 400 mg by mouth at bedtime.         . TRAMADOL HCL 50 MG PO TABS   Oral   Take 50 mg by mouth every 8 (eight) hours as needed. For pain         . TRAZODONE HCL 150 MG PO TABS   Oral   Take 150 mg by mouth at bedtime as needed. For Sleep           BP 121/71  Pulse 71  Temp 98 F (36.7 C) (Oral)  Resp 16  SpO2 100%  LMP 04/18/2012  Physical Exam  Nursing note and vitals reviewed. Constitutional: She is oriented to person, place, and time. She appears well-developed and well-nourished. No distress.  HENT:  Head: Normocephalic and atraumatic.  Eyes: Conjunctivae normal and EOM are normal.  Neck: Normal range of motion. Neck supple.       No meningeal signs  Cardiovascular: Normal rate, regular rhythm and normal heart sounds.  Exam reveals no gallop and no friction rub.   No murmur heard. Pulmonary/Chest: Effort normal and breath sounds normal. No respiratory distress. She has no wheezes. She has no rales. She exhibits no tenderness.  Abdominal: Soft. Bowel sounds are normal. She exhibits no distension. There is no tenderness. There is no rebound and no guarding.  Musculoskeletal: Normal range of motion. She exhibits no edema and no tenderness.  Neurological: She is alert and oriented to person, place, and time. No cranial nerve deficit.  Skin: Skin is warm and dry. She is not diaphoretic. No erythema.    ED Course  Procedures (including critical care time)  Labs Reviewed - No data to display Dg Abd 1 View  05/09/2012  *RADIOLOGY REPORT*  Clinical Data: Gastric banding in 2011.  Low abdominal and back pain with constipation; subsequent diarrhea, nausea and vomiting.  ABDOMEN - 1 VIEW  Comparison: 05/02/2012 acute abdominal series.  Findings: Gastric band orientation is somewhat horizontal, but unchanged.  Its tubing is intact.  The bowel gas pattern is normal. There is no supine evidence  of free intraperitoneal air.  IMPRESSION: Stable examination with  stable appearance of the gastric laparoscopic band.  No acute findings identified.   Original Report Authenticated By: Carey Bullocks, M.D.      1. Nausea       MDM  Imaging from yesterday shows no acute abdomen, including stable appearance of gastric band. Labs are unremarkable. Pt improved with bolus for rehydration and zofran. Dr. Ethelda Chick to examine for possible discharge with prescription for zofran and resource list to establish relationship with PCP for further evaluation.  Chem 8 showed blood glucose of 137 today. Pt successfully passed fluid challenge. Also ate some crackers.  Patient is nontoxic, nonseptic appearing, in no apparent distress.  Patient's pain and other symptoms adequately managed in emergency department.  Fluid bolus given.  Labs, imaging and vitals form most recent visits (including yesterday) reviewed.  Patient does not meet the SIRS or Sepsis criteria.  On repeat exam patient does not have a surgical abdomin and there are nor peritoneal signs.  No indication of appendicitis, bowel obstruction, bowel perforation, cholecystitis, diverticulitis.  Patient discharged home with symptomatic treatment and given strict instructions for follow-up with their primary care physician.  I have also discussed reasons to return immediately to the ER.  Patient expresses understanding and agrees with plan.    Glade Nurse, PA-C 05/10/12 2222

## 2012-05-10 NOTE — ED Notes (Signed)
Pt states "the pain medicine does no good and just makes me sick"

## 2012-05-10 NOTE — ED Provider Notes (Addendum)
Complains of nausea, vomiting diarrhea onset yesterday for feels much improved since treatment here. Presently hungry. Complains of abdominal pain which is intermittent crampy, diffuse  Doug Sou, MD 05/10/12 1159  Doug Sou, MD 05/12/12 [phone]

## 2012-05-10 NOTE — ED Notes (Signed)
Pt given sprite and crackers. Pt able to keep both food and fluids down.

## 2012-05-11 LAB — URINE CULTURE: Colony Count: >=100000

## 2012-05-12 ENCOUNTER — Telehealth (HOSPITAL_COMMUNITY): Payer: Self-pay | Admitting: Emergency Medicine

## 2012-05-12 NOTE — ED Provider Notes (Signed)
Medical screening examination/treatment/procedure(s) were conducted as a shared visit with non-physician practitioner(s) and myself.  I personally evaluated the patient during the encounter  Doug Sou, MD 05/12/12 [phone]

## 2012-05-12 NOTE — ED Notes (Signed)
Results received from [facility]. (+) URNC -> >/= 100,000 colonies, E Coli.  Rx given in ED for Nitrofurantoin -> sensitive to the same.  Chart appended per protocol.  

## 2012-06-09 ENCOUNTER — Other Ambulatory Visit (HOSPITAL_COMMUNITY): Payer: Self-pay | Admitting: Family Medicine

## 2012-06-16 ENCOUNTER — Encounter (INDEPENDENT_AMBULATORY_CARE_PROVIDER_SITE_OTHER): Payer: Medicare Other

## 2012-06-28 ENCOUNTER — Emergency Department (HOSPITAL_COMMUNITY): Payer: Medicare Other

## 2012-06-28 ENCOUNTER — Emergency Department (HOSPITAL_COMMUNITY)
Admission: EM | Admit: 2012-06-28 | Discharge: 2012-06-28 | Disposition: A | Discharge status: Home / Self Care | Payer: Medicare Other | Attending: Emergency Medicine | Admitting: Emergency Medicine

## 2012-06-28 ENCOUNTER — Encounter (HOSPITAL_COMMUNITY): Payer: Self-pay | Admitting: Emergency Medicine

## 2012-06-28 DIAGNOSIS — W1809XA Striking against other object with subsequent fall, initial encounter: Secondary | ICD-10-CM | POA: Insufficient documentation

## 2012-06-28 DIAGNOSIS — IMO0002 Reserved for concepts with insufficient information to code with codable children: Secondary | ICD-10-CM | POA: Insufficient documentation

## 2012-06-28 DIAGNOSIS — I10 Essential (primary) hypertension: Secondary | ICD-10-CM | POA: Insufficient documentation

## 2012-06-28 DIAGNOSIS — Y9301 Activity, walking, marching and hiking: Secondary | ICD-10-CM | POA: Insufficient documentation

## 2012-06-28 DIAGNOSIS — Z87891 Personal history of nicotine dependence: Secondary | ICD-10-CM | POA: Insufficient documentation

## 2012-06-28 DIAGNOSIS — F411 Generalized anxiety disorder: Secondary | ICD-10-CM | POA: Insufficient documentation

## 2012-06-28 DIAGNOSIS — E669 Obesity, unspecified: Secondary | ICD-10-CM | POA: Insufficient documentation

## 2012-06-28 DIAGNOSIS — W010XXA Fall on same level from slipping, tripping and stumbling without subsequent striking against object, initial encounter: Secondary | ICD-10-CM | POA: Insufficient documentation

## 2012-06-28 DIAGNOSIS — Y9289 Other specified places as the place of occurrence of the external cause: Secondary | ICD-10-CM | POA: Insufficient documentation

## 2012-06-28 DIAGNOSIS — E785 Hyperlipidemia, unspecified: Secondary | ICD-10-CM | POA: Insufficient documentation

## 2012-06-28 DIAGNOSIS — E119 Type 2 diabetes mellitus without complications: Secondary | ICD-10-CM | POA: Insufficient documentation

## 2012-06-28 DIAGNOSIS — Z8719 Personal history of other diseases of the digestive system: Secondary | ICD-10-CM | POA: Insufficient documentation

## 2012-06-28 DIAGNOSIS — G40909 Epilepsy, unspecified, not intractable, without status epilepticus: Secondary | ICD-10-CM | POA: Insufficient documentation

## 2012-06-28 DIAGNOSIS — F319 Bipolar disorder, unspecified: Secondary | ICD-10-CM | POA: Insufficient documentation

## 2012-06-28 DIAGNOSIS — Z79899 Other long term (current) drug therapy: Secondary | ICD-10-CM | POA: Insufficient documentation

## 2012-06-28 MED ORDER — OXYCODONE-ACETAMINOPHEN 5-325 MG PO TABS
2.0000 | ORAL_TABLET | Freq: Once | ORAL | Status: AC
  Administered 2012-06-28: 2 via ORAL
  Filled 2012-06-28: qty 2

## 2012-06-28 MED ORDER — OXYCODONE-ACETAMINOPHEN 5-325 MG PO TABS: 2.0000 | ORAL_TABLET | ORAL | Status: DC | PRN

## 2012-06-28 NOTE — ED Notes (Signed)
Pt has a ride home.  

## 2012-06-28 NOTE — ED Notes (Signed)
Pt slipped and fell on ice 3 hrs ago, c/o pain in low back and coccyx.

## 2012-06-28 NOTE — ED Provider Notes (Signed)
History    This chart was scribed for non-physician practitioner working with Gerhard Munch, MD by ED Scribe, Burman Nieves. This patient was seen in room WTR7/WTR7 and the patient's care was started at 3:06 PM.   CSN: [id number]  Arrival date & time 06/28/12  1421   First MD Initiated Contact with Patient 06/28/12 1506      Chief Complaint  Patient presents with  . Back Pain  . Tailbone Pain  . Fall    pt slipped and fell on ice at 1100. Denies LOC or dizziness    (Consider location/radiation/quality/duration/timing/severity/associated sxs/prior treatment) Patient is a 52 y.o. female presenting with back pain and fall. The history is provided by the patient. No language interpreter was used.  Back Pain Location:  Lumbar spine Quality:  Stabbing Radiates to:  Does not radiate Pain severity:  Moderate Onset quality:  Sudden Duration:  3 hours Timing:  Constant Progression:  Unchanged Context: falling   Worsened by:  Ambulation Associated symptoms: no chest pain, no fever and no headaches   Fall Pertinent negatives include no fever, no nausea, no vomiting and no headaches.   Lynn Palmer is a 52 y.o. female who presents to the Emergency Department complaining of moderate constant lower back and coccyx pain onset 3 hours ago. Pt states she was walking to the bus stop when she slipped and fell on some ice. Pt denies any numbness or tingling with pain and was able to ambulate after incident. She reports that pain does not radiate but the sensation is sharp/stabbing. Pt denies LOC, head injury, bladder/bowel incontinence, saddle anesthesia, fever, chills, cough, nausea, vomiting, diarrhea, SOB, weakness, and any other associated symptoms. Pt states she has no hx of surgeries but takes Tramadol for chronic back pain. Took today without relief. Pt states she has past hx of obesity.  Past Medical History  Diagnosis Date  . Bipolar 1 disorder   . Obesity   . Diabetes mellitus   .  Hyperlipidemia   . Hypertension   . Vomiting   . Depression   . Anxiety   . Substance abuse     alcohol   . Seizures     as a teenager    Past Surgical History  Procedure Laterality Date  . Tonsillectomy    . Laparoscopic gastric banding  06/11/09  . Vaginal delivery  1987    Family History  Problem Relation Age of Onset  . Osteoarthritis Mother   . Heart failure Father   . Osteoarthritis Father     History  Substance Use Topics  . Smoking status: Former Games developer  . Smokeless tobacco: Never Used  . Alcohol Use: 0.0 oz/week     Comment: social    OB History   Grav Para Term Preterm Abortions TAB SAB Ect Mult Living                  Review of Systems  Constitutional: Negative for fever and chills.  HENT: Negative for sore throat and rhinorrhea.   Eyes: Negative for visual disturbance.  Respiratory: Negative for cough and shortness of breath.   Cardiovascular: Negative for chest pain.  Gastrointestinal: Negative for nausea, vomiting and diarrhea.  Musculoskeletal: Positive for back pain.  Neurological: Negative for headaches.  Psychiatric/Behavioral: Negative for confusion.  All other systems reviewed and are negative.    Allergies  Review of patient's allergies indicates no known allergies.  Home Medications   Current Outpatient Rx  Name  Route  Sig  Dispense  Refill  . amLODipine (NORVASC) 5 MG tablet   Oral   Take 5 mg by mouth every morning.          . busPIRone (BUSPAR) 15 MG tablet   Oral   Take 15 mg by mouth 3 (three) times daily.          . divalproex (DEPAKOTE) 500 MG DR tablet   Oral   Take 500 mg by mouth 3 (three) times daily. For mood stabilization and mania.         . Melatonin 10 MG TBCR   Oral   Take 10 mg by mouth at bedtime as needed (for sleep).          . metFORMIN (GLUCOPHAGE) 500 MG tablet   Oral   Take 500 mg by mouth 2 (two) times daily.          . metoprolol (LOPRESSOR) 50 MG tablet   Oral   Take 50 mg by  mouth 2 (two) times daily.         . Multiple Vitamin (MULTIVITAMIN WITH MINERALS) TABS   Oral   Take 1 tablet by mouth every morning.         Marland Kitchen NORTREL 7/7/7 0.5/0.75/1-35 MG-MCG tablet   Oral   Take 1 tablet by mouth Daily.          . pravastatin (PRAVACHOL) 20 MG tablet   Oral   Take 20 mg by mouth every evening.          Marland Kitchen QUEtiapine (SEROQUEL) 200 MG tablet   Oral   Take 400 mg by mouth at bedtime.         . traMADol (ULTRAM) 50 MG tablet   Oral   Take 50 mg by mouth every 8 (eight) hours as needed for pain. For pain         . traZODone (DESYREL) 150 MG tablet   Oral   Take 150 mg by mouth at bedtime as needed. For Sleep           BP 140/79  Pulse 77  Temp(Src) 98.3 F (36.8 C) (Oral)  SpO2 100%  LMP 05/17/2012  Physical Exam  Nursing note and vitals reviewed. Constitutional: She is oriented to person, place, and time. She appears well-developed and well-nourished. No distress.  HENT:  Head: Normocephalic and atraumatic.  Mouth/Throat: Oropharynx is clear and moist. No oropharyngeal exudate.  Eyes: Conjunctivae and EOM are normal. Pupils are equal, round, and reactive to light. No scleral icterus.  Neck: Normal range of motion. Neck supple.  Cardiovascular: Normal rate, regular rhythm, normal heart sounds and intact distal pulses.   No murmur heard. Pulmonary/Chest: Effort normal and breath sounds normal. No respiratory distress. She has no wheezes. She has no rales.  Abdominal: Soft. Bowel sounds are normal. There is no tenderness. There is no rebound and no guarding.  Musculoskeletal: Normal range of motion.       Right hip: Normal.       Left hip: Normal.       Thoracic back: Normal.       Lumbar back: She exhibits tenderness and bony tenderness. She exhibits no swelling, no deformity and no laceration.  Tenderness upon palpation of midline of lumbar spine. No step offs or bony deformities appreciated  Lymphadenopathy:    She has no cervical  adenopathy.  Neurological: She is alert and oriented to person, place, and time. She has normal strength. She displays no tremor. No sensory deficit.  She exhibits normal muscle tone. Gait normal. GCS eye subscore is 4. GCS verbal subscore is 5. GCS motor subscore is 6.  Skin: Skin is warm and dry. She is not diaphoretic.  No bruising or abrasions on lower back.  Psychiatric: She has a normal mood and affect. Her behavior is normal.    ED Course  Procedures (including critical care time) DIAGNOSTIC STUDIES: Oxygen Saturation is 100% on room air, normal by my interpretation.    COORDINATION OF CARE: 3:29 PM Discussed ED treatment with pt and pt agrees.     Labs Reviewed - No data to display Dg Lumbar Spine Complete  06/28/2012  *RADIOLOGY REPORT*  Clinical Data: Fall.  Low back pain extending into the sacrum.  LUMBAR SPINE - COMPLETE 4+ VIEW  Comparison: Lumbar spine radiographs 12/23/2011.  Findings: A lap band is stable position.  Five non-rib bearing lumbar type vertebral bodies are present.  The vertebral body heights and alignment maintained.  Moderate facet degenerative changes are present bilaterally at L5-S1.  No acute fracture or traumatic subluxation is evident.  IMPRESSION:  1.  Stable facet degenerative changes at L5-S1. 2.  No acute abnormality.   Original Report Authenticated By: Marin Roberts, M.D.      1. Low back pain      MDM  Uncomplicated low back pain secondary to fall. Patient to d/c with PCP follow up. Patient told to apply ice to back for first 48 hours and take ibuprofen or aleve to help muscle inflammation. Percocet provided as tramadol provided no relief of the patient's pain. Indications for ED return discussed. Patient states comfort and understanding with this d/c plan with no unaddressed concerns.  I personally performed the services described in this documentation, which was scribed in my presence. The recorded information has been reviewed and is  accurate.          Antony Madura, PA-C 07/01/12 [phone]

## 2012-06-28 NOTE — ED Notes (Signed)
Patient transported to X-ray. Pt ambulatory to x-ray with steady gait.  

## 2012-07-02 NOTE — ED Provider Notes (Signed)
  Medical screening examination/treatment/procedure(s) were performed by non-physician practitioner and as supervising physician I was immediately available for consultation/collaboration.    Gerhard Munch, MD 07/02/12 [phone]

## 2012-07-12 ENCOUNTER — Emergency Department (HOSPITAL_COMMUNITY)
Admission: EM | Admit: 2012-07-12 | Discharge: 2012-07-12 | Disposition: A | Discharge status: Home / Self Care | Payer: Medicare Other | Attending: Emergency Medicine | Admitting: Emergency Medicine

## 2012-07-12 ENCOUNTER — Encounter (HOSPITAL_COMMUNITY): Payer: Self-pay | Admitting: Emergency Medicine

## 2012-07-12 DIAGNOSIS — Z8669 Personal history of other diseases of the nervous system and sense organs: Secondary | ICD-10-CM | POA: Insufficient documentation

## 2012-07-12 DIAGNOSIS — F319 Bipolar disorder, unspecified: Secondary | ICD-10-CM | POA: Insufficient documentation

## 2012-07-12 DIAGNOSIS — E119 Type 2 diabetes mellitus without complications: Secondary | ICD-10-CM | POA: Insufficient documentation

## 2012-07-12 DIAGNOSIS — F411 Generalized anxiety disorder: Secondary | ICD-10-CM | POA: Insufficient documentation

## 2012-07-12 DIAGNOSIS — K029 Dental caries, unspecified: Secondary | ICD-10-CM | POA: Insufficient documentation

## 2012-07-12 DIAGNOSIS — I10 Essential (primary) hypertension: Secondary | ICD-10-CM | POA: Insufficient documentation

## 2012-07-12 DIAGNOSIS — K0889 Other specified disorders of teeth and supporting structures: Secondary | ICD-10-CM

## 2012-07-12 DIAGNOSIS — E669 Obesity, unspecified: Secondary | ICD-10-CM | POA: Insufficient documentation

## 2012-07-12 DIAGNOSIS — K089 Disorder of teeth and supporting structures, unspecified: Secondary | ICD-10-CM | POA: Insufficient documentation

## 2012-07-12 DIAGNOSIS — Z87891 Personal history of nicotine dependence: Secondary | ICD-10-CM | POA: Insufficient documentation

## 2012-07-12 DIAGNOSIS — Z79899 Other long term (current) drug therapy: Secondary | ICD-10-CM | POA: Insufficient documentation

## 2012-07-12 DIAGNOSIS — E785 Hyperlipidemia, unspecified: Secondary | ICD-10-CM | POA: Insufficient documentation

## 2012-07-12 MED ORDER — OXYCODONE-ACETAMINOPHEN 5-325 MG PO TABS
2.0000 | ORAL_TABLET | Freq: Once | ORAL | Status: AC
  Administered 2012-07-12: 2 via ORAL
  Filled 2012-07-12: qty 2

## 2012-07-12 MED ORDER — OXYCODONE-ACETAMINOPHEN 5-325 MG PO TABS: 1.0000 | ORAL_TABLET | Freq: Four times a day (QID) | ORAL | Status: DC | PRN

## 2012-07-12 NOTE — ED Provider Notes (Signed)
History     CSN: [id number]  Arrival date & time 07/12/12  1210   First MD Initiated Contact with Patient 07/12/12 1217      Chief Complaint  Patient presents with  . Dental Pain    (Consider location/radiation/quality/duration/timing/severity/associated sxs/prior treatment) HPI Comments: Patient is a 52 year old female who presents for pain to her left lower molar x3 days. Patient states the pain is a throbbing and nonradiating sensation that is made worse when drinking cold beverages and chewing on the affected side. She states she has taken Tylenol for the pain without relief. Patient denies any alleviating factors. She has a history of dental caries which have been filled, but she has not and to see a dentist in almost 2 years. She denies fever, vision changes, headache, ear pain or discharge, hearing loss, difficulty swallowing or sore throat, neck pain or stiffness, chest pain, and shortness of breath.  Patient is a 52 y.o. female presenting with tooth pain. The history is provided by the patient. No language interpreter was used.  Dental PainPrimary symptoms do not include headaches, fever, shortness of breath, sore throat or cough.  Additional symptoms do not include: facial swelling, trouble swallowing and drooling.    Past Medical History  Diagnosis Date  . Bipolar 1 disorder   . Obesity   . Diabetes mellitus   . Hyperlipidemia   . Hypertension   . Vomiting   . Depression   . Anxiety   . Substance abuse     alcohol   . Seizures     as a teenager    Past Surgical History  Procedure Laterality Date  . Tonsillectomy    . Laparoscopic gastric banding  06/11/09  . Vaginal delivery  1987    Family History  Problem Relation Age of Onset  . Osteoarthritis Mother   . Heart failure Father   . Osteoarthritis Father     History  Substance Use Topics  . Smoking status: Former Games developer  . Smokeless tobacco: Never Used  . Alcohol Use: 0.0 oz/week     Comment: social     OB History   Grav Para Term Preterm Abortions TAB SAB Ect Mult Living                  Review of Systems  Constitutional: Negative for fever.  HENT: Negative for sore throat, facial swelling, drooling, trouble swallowing and voice change.        +dental pain  Respiratory: Negative for cough, choking, shortness of breath and stridor.   Gastrointestinal: Negative for nausea and vomiting.  Neurological: Negative for dizziness, syncope, weakness, light-headedness, numbness and headaches.  All other systems reviewed and are negative.    Allergies  Review of patient's allergies indicates no known allergies.  Home Medications   Current Outpatient Rx  Name  Route  Sig  Dispense  Refill  . amLODipine (NORVASC) 5 MG tablet   Oral   Take 5 mg by mouth every morning.          . busPIRone (BUSPAR) 15 MG tablet   Oral   Take 15 mg by mouth 3 (three) times daily.          Marland Kitchen EXPIRED: divalproex (DEPAKOTE) 500 MG DR tablet   Oral   Take 500 mg by mouth 3 (three) times daily. For mood stabilization and mania.         . Melatonin 10 MG TBCR   Oral   Take 10 mg by mouth  at bedtime as needed (for sleep).          . metFORMIN (GLUCOPHAGE) 500 MG tablet   Oral   Take 500 mg by mouth 2 (two) times daily.          . metoprolol (LOPRESSOR) 50 MG tablet   Oral   Take 50 mg by mouth 2 (two) times daily.         . Multiple Vitamin (MULTIVITAMIN WITH MINERALS) TABS   Oral   Take 1 tablet by mouth every morning.         Marland Kitchen NORTREL 7/7/7 0.5/0.75/1-35 MG-MCG tablet   Oral   Take 1 tablet by mouth Daily.          Marland Kitchen oxyCODONE-acetaminophen (PERCOCET/ROXICET) 5-325 MG per tablet   Oral   Take 1 tablet by mouth every 6 (six) hours as needed for pain.   6 tablet   0   . pravastatin (PRAVACHOL) 20 MG tablet   Oral   Take 20 mg by mouth every evening.          Marland Kitchen QUEtiapine (SEROQUEL) 200 MG tablet   Oral   Take 400 mg by mouth at bedtime.         . traMADol  (ULTRAM) 50 MG tablet   Oral   Take 50 mg by mouth every 8 (eight) hours as needed for pain. For pain         . traZODone (DESYREL) 150 MG tablet   Oral   Take 150 mg by mouth at bedtime as needed. For Sleep           BP 112/66  Pulse 73  Temp(Src) 98.3 F (36.8 C) (Oral)  Resp 18  SpO2 99%  LMP 06/28/2012  Physical Exam  Nursing note and vitals reviewed. Constitutional: She is oriented to person, place, and time. She appears well-developed and well-nourished. No distress.  Patient sitting upright in chair in no visible discomfort; well and nontoxic appearing. Patient opens mouth fully when speaking.  HENT:  Head: Normocephalic and atraumatic.  Right Ear: External ear normal.  Left Ear: External ear normal.  Mouth/Throat: Uvula is midline and mucous membranes are normal. Dental caries present. No dental abscesses. No oropharyngeal exudate, posterior oropharyngeal edema, posterior oropharyngeal erythema or tonsillar abscesses.    No gingival swelling or erythema appreciated. No lesions, bleeding, lacerations, or trismus. Patient has dental fillings noted on the diagram secondary to dental caries. Tenderness on palpation of the left lower molar. Bilateral ear canals and tympanic membranes normal without erythema, bulging, retraction, or perforation. No facial swelling.  Eyes: Conjunctivae are normal. Pupils are equal, round, and reactive to light. No scleral icterus.  Neck: Normal range of motion. Neck supple.  Cardiovascular: Normal rate, regular rhythm, normal heart sounds and intact distal pulses.   Pulmonary/Chest: Effort normal and breath sounds normal. No respiratory distress. She has no wheezes. She has no rales.  Musculoskeletal: Normal range of motion.  Lymphadenopathy:    She has no cervical adenopathy.  Neurological: She is alert and oriented to person, place, and time.  Skin: Skin is warm and dry. She is not diaphoretic. No erythema.  Psychiatric: She has a  normal mood and affect. Her behavior is normal.    ED Course  Procedures (including critical care time)  Labs Reviewed - No data to display No results found.   1. Pain, dental      MDM  Uncomplicated dental pain without evidence of abscess. Patient well and nontoxic  appearing, afebrile, and in NAD. Stable for d/c with f/u with dentist and percocet to take as needed for pain; [facility]. Patient states comfort and understanding with this d/c plan without any unaddressed concerns.   Filed Vitals:   07/12/12 1221 07/12/12 1321  BP: 112/66 123/74  Pulse: 73 78  Temp: 98.3 F (36.8 C) 98.3 F (36.8 C)  TempSrc: Oral Oral  Resp: 18 18  SpO2: 99% 100%         Antony Madura, PA-C 07/14/12 1933

## 2012-07-12 NOTE — ED Notes (Signed)
Pt 9/10 lower left dental pain. Pt reports not having a dentist. Pt states she had a crown placed in 2008 and began having pain over the weekend. Pt reports not going to the dentist for nearly two years. Mouth inspected visually, no major abnormalities noted other than poor dental hygiene and caries.

## 2012-07-15 NOTE — ED Provider Notes (Signed)
  Medical screening examination/treatment/procedure(s) were performed by non-physician practitioner and as supervising physician I was immediately available for consultation/collaboration.    Marlynn Hinckley, MD 07/15/12 1617 

## 2012-07-18 ENCOUNTER — Ambulatory Visit (HOSPITAL_COMMUNITY): Payer: Self-pay

## 2012-07-28 ENCOUNTER — Emergency Department (HOSPITAL_COMMUNITY)
Admission: EM | Admit: 2012-07-28 | Discharge: 2012-07-28 | Disposition: A | Discharge status: Home / Self Care | Payer: Medicare Other | Attending: Emergency Medicine | Admitting: Emergency Medicine

## 2012-07-28 DIAGNOSIS — E785 Hyperlipidemia, unspecified: Secondary | ICD-10-CM | POA: Insufficient documentation

## 2012-07-28 DIAGNOSIS — E119 Type 2 diabetes mellitus without complications: Secondary | ICD-10-CM | POA: Insufficient documentation

## 2012-07-28 DIAGNOSIS — Z87891 Personal history of nicotine dependence: Secondary | ICD-10-CM | POA: Insufficient documentation

## 2012-07-28 DIAGNOSIS — I10 Essential (primary) hypertension: Secondary | ICD-10-CM | POA: Insufficient documentation

## 2012-07-28 DIAGNOSIS — K0889 Other specified disorders of teeth and supporting structures: Secondary | ICD-10-CM

## 2012-07-28 DIAGNOSIS — E669 Obesity, unspecified: Secondary | ICD-10-CM | POA: Insufficient documentation

## 2012-07-28 DIAGNOSIS — Z79899 Other long term (current) drug therapy: Secondary | ICD-10-CM | POA: Insufficient documentation

## 2012-07-28 DIAGNOSIS — K029 Dental caries, unspecified: Secondary | ICD-10-CM | POA: Insufficient documentation

## 2012-07-28 DIAGNOSIS — F411 Generalized anxiety disorder: Secondary | ICD-10-CM | POA: Insufficient documentation

## 2012-07-28 DIAGNOSIS — Z8669 Personal history of other diseases of the nervous system and sense organs: Secondary | ICD-10-CM | POA: Insufficient documentation

## 2012-07-28 DIAGNOSIS — F319 Bipolar disorder, unspecified: Secondary | ICD-10-CM | POA: Insufficient documentation

## 2012-07-28 DIAGNOSIS — K089 Disorder of teeth and supporting structures, unspecified: Secondary | ICD-10-CM | POA: Insufficient documentation

## 2012-07-28 MED ORDER — HYDROCODONE-ACETAMINOPHEN 5-325 MG PO TABS: 1.0000 | ORAL_TABLET | ORAL | Status: DC | PRN

## 2012-07-28 MED ORDER — HYDROCODONE-ACETAMINOPHEN 5-325 MG PO TABS
1.0000 | ORAL_TABLET | Freq: Once | ORAL | Status: AC
  Administered 2012-07-28: 1 via ORAL
  Filled 2012-07-28: qty 1

## 2012-07-28 NOTE — ED Provider Notes (Signed)
Medical screening examination/treatment/procedure(s) were performed by non-physician practitioner and as supervising physician I was immediately available for consultation/collaboration.   Mahlia Fernando Y. Damarys Speir, MD 07/28/12 1704 

## 2012-07-28 NOTE — ED Provider Notes (Signed)
History  This chart was scribed for non-physician practitioner Sharilyn Sites, PA-C working with Gavin Pound. Oletta Lamas, MD, by Candelaria Stagers, ED Scribe. This patient was seen in room WTR7/WTR7 and the patient's care was started at 4:15 PM   CSN: [id number]  Arrival date & time 07/28/12  1600   First MD Initiated Contact with Patient 07/28/12 1614      Chief Complaint  Patient presents with  . Dental Pain     The history is provided by the patient. No language interpreter was used.   Lynn Palmer is a 52 y.o. female who presents to the Emergency Department complaining of left lower dental pain that started several weeks ago and has gradually worsened.  Pt reports she was seen in the ED for the same dental pain.  Was referred to a dentist but she never scheduled an appt because she cannot afford her co-pay.  Pain is exacerbated by chewing on the affected side.  She took the percocet she was given her with good relief of sx but she has since run out.  She has starting applying anbesol to the area with no relief.  Pt denies fever, facial swelling, difficulty swallowing, or trismus.  Past Medical History  Diagnosis Date  . Bipolar 1 disorder   . Obesity   . Diabetes mellitus   . Hyperlipidemia   . Hypertension   . Vomiting   . Depression   . Anxiety   . Substance abuse     alcohol   . Seizures     as a teenager    Past Surgical History  Procedure Laterality Date  . Tonsillectomy    . Laparoscopic gastric banding  06/11/09  . Vaginal delivery  1987    Family History  Problem Relation Age of Onset  . Osteoarthritis Mother   . Heart failure Father   . Osteoarthritis Father     History  Substance Use Topics  . Smoking status: Former Games developer  . Smokeless tobacco: Never Used  . Alcohol Use: 0.0 oz/week     Comment: social    OB History   Grav Para Term Preterm Abortions TAB SAB Ect Mult Living                  Review of Systems  Constitutional: Negative for fever and  chills.  HENT: Positive for dental problem (left lower dental pain).   All other systems reviewed and are negative.    Allergies  Review of patient's allergies indicates no known allergies.  Home Medications   Current Outpatient Rx  Name  Route  Sig  Dispense  Refill  . acetaminophen (TYLENOL) 325 MG tablet   Oral   Take 650 mg by mouth every 6 (six) hours as needed for pain (PAIN/HEADACHE).         Marland Kitchen amLODipine (NORVASC) 5 MG tablet   Oral   Take 5 mg by mouth every morning.          . divalproex (DEPAKOTE) 500 MG DR tablet   Oral   Take 500 mg by mouth 3 (three) times daily. For mood stabilization and mania.         . Melatonin 10 MG TBCR   Oral   Take 10 mg by mouth at bedtime as needed (for sleep).          . metFORMIN (GLUCOPHAGE) 500 MG tablet   Oral   Take 500 mg by mouth 2 (two) times daily.          Marland Kitchen  metoprolol (LOPRESSOR) 50 MG tablet   Oral   Take 50 mg by mouth 2 (two) times daily.         . Multiple Vitamin (MULTIVITAMIN WITH MINERALS) TABS   Oral   Take 1 tablet by mouth every morning.         Marland Kitchen NORTREL 7/7/7 0.5/0.75/1-35 MG-MCG tablet   Oral   Take 1 tablet by mouth Daily.          Marland Kitchen oxyCODONE-acetaminophen (PERCOCET/ROXICET) 5-325 MG per tablet   Oral   Take 1 tablet by mouth every 6 (six) hours as needed for pain.   6 tablet   0   . pravastatin (PRAVACHOL) 20 MG tablet   Oral   Take 20 mg by mouth every evening.          Marland Kitchen QUEtiapine (SEROQUEL) 200 MG tablet   Oral   Take 400 mg by mouth at bedtime.         . traMADol (ULTRAM) 50 MG tablet   Oral   Take 50 mg by mouth every 8 (eight) hours as needed for pain. For pain         . traZODone (DESYREL) 150 MG tablet   Oral   Take 150 mg by mouth at bedtime as needed. For Sleep           BP 125/82  Pulse 80  Temp(Src) 98.9 F (37.2 C) (Oral)  Resp 16  SpO2 98%  LMP 06/28/2012  Physical Exam  Constitutional: She is oriented to person, place, and time.  She appears well-developed and well-nourished.  HENT:  Head: Normocephalic and atraumatic. No trismus in the jaw.  Mouth/Throat: Uvula is midline, oropharynx is clear and moist and mucous membranes are normal. No oral lesions. Abnormal dentition. Dental caries present. No dental abscesses or edematous. No oropharyngeal exudate, posterior oropharyngeal edema, posterior oropharyngeal erythema or tonsillar abscesses.    Pain over left lower molars without surrounding gingival swelling, erythema, signs or infection, or abscess; handling secretions appropriately, no trismus  Eyes: Conjunctivae and EOM are normal. Pupils are equal, round, and reactive to light.  Neck: Normal range of motion. Neck supple. No rigidity.  Cardiovascular: Normal rate, regular rhythm and normal heart sounds.   Pulmonary/Chest: Effort normal and breath sounds normal. She has no wheezes.  Musculoskeletal: Normal range of motion.  Neurological: She is alert and oriented to person, place, and time.  Skin: Skin is warm and dry.  Psychiatric: She has a normal mood and affect.    ED Course  Procedures   DIAGNOSTIC STUDIES: Oxygen Saturation is 98% on room air, normal by my interpretation.    COORDINATION OF CARE:  4:17 PM Discussed course of care with pt which includes pain medication and follow up with a dentist.  Pt understands and agrees.    Labs Reviewed - No data to display No results found.   1. Pain, dental       MDM   Patient presenting to the emergency department for recurrent episodes of dental pain.  Patient reports she attempted to followup with dentist but could not afford her co-pay.  Pain localized over left lower molars without signs of infection or abscess.  She will be given a short course of Vicodin for pain relief. Follow-up with dentist Dr. Mayford Knife. Resource guide given for alternative follow-ups. Discussed plan with the patient-she agreed. Return precautions advised.  I personally performed  the services described in this documentation, which was scribed in my presence. The  recorded information has been reviewed and is accurate.        Garlon Hatchet, PA-C 07/28/12 [phone]

## 2012-07-28 NOTE — ED Notes (Signed)
Pt c/o pain to lower back L side of mouth. Pt states she has contacted a dental clinic, but can't afford it. Pt states she has used Anbesol for pain and it helps some, but the pain comes back. Pt with no acute distress. Pt has a ride home.

## 2012-07-28 NOTE — ED Notes (Signed)
Pt c/o pain to a tooth on the bottom left x over a month.  Was seen here for the same about 2 weeks ago and referred to a dentist but states she doesn't have the funds to see the dentist.

## 2012-08-03 ENCOUNTER — Emergency Department (HOSPITAL_COMMUNITY)
Admission: EM | Admit: 2012-08-03 | Discharge: 2012-08-04 | Disposition: A | Discharge status: Other Acute Inpatient Hospital | Payer: Medicare Other | Attending: Emergency Medicine | Admitting: Emergency Medicine

## 2012-08-03 ENCOUNTER — Encounter (HOSPITAL_COMMUNITY): Payer: Self-pay

## 2012-08-03 DIAGNOSIS — T438X2A Poisoning by other psychotropic drugs, intentional self-harm, initial encounter: Secondary | ICD-10-CM | POA: Insufficient documentation

## 2012-08-03 DIAGNOSIS — Z87891 Personal history of nicotine dependence: Secondary | ICD-10-CM | POA: Insufficient documentation

## 2012-08-03 DIAGNOSIS — F411 Generalized anxiety disorder: Secondary | ICD-10-CM | POA: Insufficient documentation

## 2012-08-03 DIAGNOSIS — F319 Bipolar disorder, unspecified: Secondary | ICD-10-CM | POA: Insufficient documentation

## 2012-08-03 DIAGNOSIS — E669 Obesity, unspecified: Secondary | ICD-10-CM | POA: Insufficient documentation

## 2012-08-03 DIAGNOSIS — G40909 Epilepsy, unspecified, not intractable, without status epilepticus: Secondary | ICD-10-CM | POA: Insufficient documentation

## 2012-08-03 DIAGNOSIS — T43502A Poisoning by unspecified antipsychotics and neuroleptics, intentional self-harm, initial encounter: Secondary | ICD-10-CM | POA: Insufficient documentation

## 2012-08-03 DIAGNOSIS — F1910 Other psychoactive substance abuse, uncomplicated: Secondary | ICD-10-CM | POA: Insufficient documentation

## 2012-08-03 DIAGNOSIS — Z79899 Other long term (current) drug therapy: Secondary | ICD-10-CM | POA: Insufficient documentation

## 2012-08-03 DIAGNOSIS — T43204A Poisoning by unspecified antidepressants, undetermined, initial encounter: Secondary | ICD-10-CM | POA: Insufficient documentation

## 2012-08-03 DIAGNOSIS — Z8659 Personal history of other mental and behavioral disorders: Secondary | ICD-10-CM

## 2012-08-03 DIAGNOSIS — E119 Type 2 diabetes mellitus without complications: Secondary | ICD-10-CM | POA: Insufficient documentation

## 2012-08-03 DIAGNOSIS — T43501A Poisoning by unspecified antipsychotics and neuroleptics, accidental (unintentional), initial encounter: Secondary | ICD-10-CM | POA: Insufficient documentation

## 2012-08-03 DIAGNOSIS — I10 Essential (primary) hypertension: Secondary | ICD-10-CM | POA: Insufficient documentation

## 2012-08-03 DIAGNOSIS — E785 Hyperlipidemia, unspecified: Secondary | ICD-10-CM | POA: Insufficient documentation

## 2012-08-03 LAB — COMPREHENSIVE METABOLIC PANEL
ALT: 7 U/L (ref 0–35)
AST: 19 U/L (ref 0–37)
Albumin: 2.7 g/dL — ABNORMAL LOW (ref 3.5–5.2)
Calcium: 9.5 mg/dL (ref 8.4–10.5)
GFR calc Af Amer: 64 mL/min — ABNORMAL LOW (ref >90)
Sodium: 134 mEq/L — ABNORMAL LOW (ref 135–145)
Total Protein: 7 g/dL (ref 6.0–8.3)

## 2012-08-03 LAB — URINE MICROSCOPIC-ADD ON

## 2012-08-03 LAB — URINALYSIS, ROUTINE W REFLEX MICROSCOPIC
Glucose, UA: NEGATIVE mg/dL
Protein, ur: 100 mg/dL — AB
Urobilinogen, UA: 1 mg/dL (ref 0.0–1.0)

## 2012-08-03 LAB — RAPID URINE DRUG SCREEN, HOSP PERFORMED
Amphetamines: POSITIVE — AB
Barbiturates: NOT DETECTED
Cocaine: NOT DETECTED
Opiates: NOT DETECTED
Tetrahydrocannabinol: POSITIVE — AB

## 2012-08-03 LAB — ACETAMINOPHEN LEVEL: Acetaminophen (Tylenol), Serum: <15 ug/mL (ref 10–30)

## 2012-08-03 LAB — CBC WITH DIFFERENTIAL/PLATELET
Basophils Absolute: 0 10*3/uL (ref 0.0–0.1)
Basophils Relative: 0 % (ref 0–1)
Eosinophils Absolute: 0 10*3/uL (ref 0.0–0.7)
Eosinophils Relative: 1 % (ref 0–5)
MCH: 28.8 pg (ref 26.0–34.0)
MCV: 84.8 fL (ref 78.0–100.0)
Platelets: 276 10*3/uL (ref 150–400)
RDW: 14.5 % (ref 11.5–15.5)

## 2012-08-03 LAB — ETHANOL: Alcohol, Ethyl (B): <11 mg/dL (ref 0–11)

## 2012-08-03 MED ORDER — SODIUM CHLORIDE 0.9 % IV BOLUS (SEPSIS)
1000.0000 mL | Freq: Once | INTRAVENOUS | Status: AC
  Administered 2012-08-03: 1000 mL via INTRAVENOUS
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
## 2012-08-03 NOTE — ED Provider Notes (Signed)
History     CSN: [id number]  Arrival date & time 08/03/12  1514   First MD Initiated Contact with Patient 08/03/12 1518      Chief Complaint  Patient presents with  . Suicide Attempt    (Consider location/radiation/quality/duration/timing/severity/associated sxs/prior treatment) HPI Comments: Patient with history of Depression, Bipolar.  By EMS from [facility] for eval of overdose of seroquel and trazodone.  She says she took 8-200 mg seroquel tablets and 2-150 mg trazodone tablets in an attempt to harm herself.  She admits to frequent etoh use but none since last night.  She did this due to stressors in her life including her living arrangements, interaction with her daughter.  She says she is not happy about anything in her life.  Patient is a 52 y.o. female presenting with Overdose. The history is provided by the patient.  Drug Overdose This is a new problem. The current episode started 1 to 2 hours ago. The problem occurs constantly. The problem has not changed since onset.Pertinent negatives include no chest pain, no abdominal pain and no shortness of breath. Nothing aggravates the symptoms. Nothing relieves the symptoms. She has tried nothing for the symptoms. The treatment provided no relief.    Past Medical History  Diagnosis Date  . Bipolar 1 disorder   . Obesity   . Diabetes mellitus   . Hyperlipidemia   . Hypertension   . Vomiting   . Depression   . Anxiety   . Substance abuse     alcohol   . Seizures     as a teenager    Past Surgical History  Procedure Laterality Date  . Tonsillectomy    . Laparoscopic gastric banding  06/11/09  . Vaginal delivery  1987    Family History  Problem Relation Age of Onset  . Osteoarthritis Mother   . Heart failure Father   . Osteoarthritis Father     History  Substance Use Topics  . Smoking status: Former Games developer  . Smokeless tobacco: Never Used  . Alcohol Use: 0.0 oz/week     Comment: social    OB History   Grav Para  Term Preterm Abortions TAB SAB Ect Mult Living                  Review of Systems  Respiratory: Negative for shortness of breath.   Cardiovascular: Negative for chest pain.  Gastrointestinal: Negative for abdominal pain.  All other systems reviewed and are negative.    Allergies  Review of patient's allergies indicates no known allergies.  Home Medications   Current Outpatient Rx  Name  Route  Sig  Dispense  Refill  . acetaminophen (TYLENOL) 325 MG tablet   Oral   Take 650 mg by mouth every 6 (six) hours as needed for pain (PAIN/HEADACHE).         Marland Kitchen amLODipine (NORVASC) 5 MG tablet   Oral   Take 5 mg by mouth every morning.          . divalproex (DEPAKOTE) 500 MG DR tablet   Oral   Take 500 mg by mouth 3 (three) times daily. For mood stabilization and mania.         Marland Kitchen HYDROcodone-acetaminophen (NORCO/VICODIN) 5-325 MG per tablet   Oral   Take 1 tablet by mouth every 4 (four) hours as needed for pain.   10 tablet   0   . Melatonin 10 MG TBCR   Oral   Take 10 mg by mouth  at bedtime as needed (for sleep).          . metFORMIN (GLUCOPHAGE) 500 MG tablet   Oral   Take 500 mg by mouth 2 (two) times daily.          . metoprolol (LOPRESSOR) 50 MG tablet   Oral   Take 50 mg by mouth 2 (two) times daily.         . Multiple Vitamin (MULTIVITAMIN WITH MINERALS) TABS   Oral   Take 1 tablet by mouth every morning.         Marland Kitchen NORTREL 7/7/7 0.5/0.75/1-35 MG-MCG tablet   Oral   Take 1 tablet by mouth Daily.          Marland Kitchen oxyCODONE-acetaminophen (PERCOCET/ROXICET) 5-325 MG per tablet   Oral   Take 1 tablet by mouth every 6 (six) hours as needed for pain.   6 tablet   0   . pravastatin (PRAVACHOL) 20 MG tablet   Oral   Take 20 mg by mouth every evening.          Marland Kitchen QUEtiapine (SEROQUEL) 200 MG tablet   Oral   Take 400 mg by mouth at bedtime.         . traMADol (ULTRAM) 50 MG tablet   Oral   Take 50 mg by mouth every 8 (eight) hours as needed for  pain. For pain         . traZODone (DESYREL) 150 MG tablet   Oral   Take 150 mg by mouth at bedtime as needed. For Sleep           LMP 06/28/2012  Physical Exam  Nursing note and vitals reviewed. Constitutional: She is oriented to person, place, and time. She appears well-developed and well-nourished. No distress.  HENT:  Head: Normocephalic and atraumatic.  Neck: Normal range of motion. Neck supple.  Cardiovascular: Normal rate and regular rhythm.  Exam reveals no gallop and no friction rub.   No murmur heard. Pulmonary/Chest: Effort normal and breath sounds normal. No respiratory distress. She has no wheezes.  Abdominal: Soft. Bowel sounds are normal. She exhibits no distension. There is no tenderness.  Musculoskeletal: Normal range of motion.  Neurological: She is alert and oriented to person, place, and time.  Skin: Skin is warm and dry. She is not diaphoretic.  Psychiatric: She is slowed. She expresses impulsivity and inappropriate judgment. She exhibits a depressed mood. She expresses suicidal ideation.    ED Course  Procedures (including critical care time)  Labs Reviewed  CBC WITH DIFFERENTIAL  COMPREHENSIVE METABOLIC PANEL  ACETAMINOPHEN LEVEL  SALICYLATE LEVEL  ETHANOL  URINALYSIS, ROUTINE W REFLEX MICROSCOPIC  URINE RAPID DRUG SCREEN (HOSP PERFORMED)   No results found.   No diagnosis found.   Date: 08/03/2012  Rate: 88  Rhythm: normal sinus rhythm  QRS Axis: normal  Intervals: normal  ST/T Wave abnormalities: normal  Conduction Disutrbances:none  Narrative Interpretation:   Old EKG Reviewed: unchanged    MDM  Patient is medically cleared.  She was observed and 4 hour tylenol level is <15.  I have spoken with the act team and she will be placed in the psych ed until disposition is finalized.           Geoffery Lyons, MD 08/03/12 [phone]

## 2012-08-03 NOTE — ED Notes (Signed)
This nurse was notified by the sitter  the patient has been putting her finger down her mouth/throat to induce emesis. No vomit noted.

## 2012-08-03 NOTE — ED Notes (Signed)
Pt resting in bed watching tv no signs or symptoms of distress

## 2012-08-03 NOTE — ED Notes (Signed)
Pt ate sandwich per Dr. Sandi Carne approval. No distress noted. Pt resting comfortably.

## 2012-08-03 NOTE — ED Notes (Signed)
One bag was brought back to psych ed her purple and white backpack they could not find they dont know what happened to it rn Lynn Palmer is aware of missing backpack

## 2012-08-03 NOTE — ED Notes (Signed)
Per EMS: pt states she took 6 seroquel and 2 Trazodone today about 1-2 hours ago as a suicide attempt. "thought I would lay down and die but my boyfriend called and told me to come here." pt voluntarily wanted to come to the hospital. Pt has hx of mental illness and long hx of suicide attempts. Pt picked up from [facility] and Assessment. Vital signs: BP-133/85, P-119, R-16, T-97.8
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
## 2012-08-04 ENCOUNTER — Inpatient Hospital Stay (HOSPITAL_COMMUNITY)
Admission: EM | Admit: 2012-08-04 | Discharge: 2012-08-09 | DRG: 897 | Disposition: A | Discharge status: Home / Self Care | Payer: Medicare Other | Source: Intra-hospital | Attending: Psychiatry | Admitting: Psychiatry

## 2012-08-04 ENCOUNTER — Encounter (HOSPITAL_COMMUNITY): Payer: Self-pay | Admitting: *Deleted

## 2012-08-04 DIAGNOSIS — E119 Type 2 diabetes mellitus without complications: Secondary | ICD-10-CM | POA: Diagnosis present

## 2012-08-04 DIAGNOSIS — F1020 Alcohol dependence, uncomplicated: Principal | ICD-10-CM | POA: Diagnosis present

## 2012-08-04 DIAGNOSIS — Z79899 Other long term (current) drug therapy: Secondary | ICD-10-CM | POA: Diagnosis not present

## 2012-08-04 DIAGNOSIS — R4182 Altered mental status, unspecified: Secondary | ICD-10-CM

## 2012-08-04 DIAGNOSIS — F319 Bipolar disorder, unspecified: Secondary | ICD-10-CM | POA: Diagnosis present

## 2012-08-04 DIAGNOSIS — F10929 Alcohol use, unspecified with intoxication, unspecified: Secondary | ICD-10-CM

## 2012-08-04 DIAGNOSIS — I10 Essential (primary) hypertension: Secondary | ICD-10-CM | POA: Diagnosis present

## 2012-08-04 LAB — URINE CULTURE: Culture: NO GROWTH

## 2012-08-04 MED ORDER — CHLORDIAZEPOXIDE HCL 25 MG PO CAPS: 25.0000 mg | ORAL_CAPSULE | Freq: Four times a day (QID) | ORAL | Status: AC | PRN

## 2012-08-04 MED ORDER — NORETHINDRONE-ETH ESTRADIOL 0.4-35 MG-MCG PO TABS
1.0000 | ORAL_TABLET | Freq: Every day | ORAL | Status: DC
  Filled 2012-08-04 (×2): qty 1

## 2012-08-04 MED ORDER — CHLORDIAZEPOXIDE HCL 25 MG PO CAPS
25.0000 mg | ORAL_CAPSULE | Freq: Three times a day (TID) | ORAL | Status: AC
  Administered 2012-08-05 (×3): 25 mg via ORAL
  Filled 2012-08-04 (×3): qty 1

## 2012-08-04 MED ORDER — NORETHIN-ETH ESTRAD TRIPHASIC 0.5/0.75/1-35 MG-MCG PO TABS
1.0000 | ORAL_TABLET | Freq: Every day | ORAL | Status: DC
  Administered 2012-08-04: 1 via ORAL
  Filled 2012-08-04 (×2): qty 1

## 2012-08-04 MED ORDER — DIVALPROEX SODIUM ER 500 MG PO TB24
1000.0000 mg | ORAL_TABLET | Freq: Every day | ORAL | Status: DC
  Administered 2012-08-04 – 2012-08-08 (×5): 1000 mg via ORAL
  Filled 2012-08-04 (×7): qty 2

## 2012-08-04 MED ORDER — AMLODIPINE BESYLATE 5 MG PO TABS
5.0000 mg | ORAL_TABLET | Freq: Every morning | ORAL | Status: DC
  Administered 2012-08-04 – 2012-08-09 (×6): 5 mg via ORAL
  Filled 2012-08-04 (×9): qty 1

## 2012-08-04 MED ORDER — ONDANSETRON 4 MG PO TBDP: 4.0000 mg | ORAL_TABLET | Freq: Four times a day (QID) | ORAL | Status: AC | PRN

## 2012-08-04 MED ORDER — CHLORDIAZEPOXIDE HCL 25 MG PO CAPS
25.0000 mg | ORAL_CAPSULE | Freq: Every day | ORAL | Status: AC
  Administered 2012-08-07: 25 mg via ORAL
  Filled 2012-08-04: qty 1

## 2012-08-04 MED ORDER — CHLORDIAZEPOXIDE HCL 25 MG PO CAPS
25.0000 mg | ORAL_CAPSULE | Freq: Once | ORAL | Status: AC
  Administered 2012-08-04: 25 mg via ORAL
  Filled 2012-08-04: qty 1

## 2012-08-04 MED ORDER — SIMVASTATIN 5 MG PO TABS
5.0000 mg | ORAL_TABLET | Freq: Every day | ORAL | Status: DC
  Administered 2012-08-04 – 2012-08-08 (×5): 5 mg via ORAL
  Filled 2012-08-04 (×7): qty 1

## 2012-08-04 MED ORDER — CHLORDIAZEPOXIDE HCL 25 MG PO CAPS
25.0000 mg | ORAL_CAPSULE | ORAL | Status: AC
  Administered 2012-08-06 (×2): 25 mg via ORAL
  Filled 2012-08-04 (×2): qty 1

## 2012-08-04 MED ORDER — NORETHIN-ETH ESTRAD TRIPHASIC 0.5/0.75/1-35 MG-MCG PO TABS
1.0000 | ORAL_TABLET | Freq: Every day | ORAL | Status: DC
  Administered 2012-08-05 – 2012-08-09 (×5): 1 via ORAL
  Filled 2012-08-04 (×6): qty 1

## 2012-08-04 MED ORDER — HYDROXYZINE HCL 25 MG PO TABS
25.0000 mg | ORAL_TABLET | Freq: Four times a day (QID) | ORAL | Status: AC | PRN
  Administered 2012-08-05: 25 mg via ORAL

## 2012-08-04 MED ORDER — METRONIDAZOLE 500 MG PO TABS
2000.0000 mg | ORAL_TABLET | Freq: Once | ORAL | Status: AC
  Administered 2012-08-04: 2000 mg via ORAL
  Filled 2012-08-04: qty 4

## 2012-08-04 MED ORDER — ONDANSETRON 4 MG PO TBDP
4.0000 mg | ORAL_TABLET | Freq: Once | ORAL | Status: AC
  Administered 2012-08-04: 4 mg via ORAL
  Filled 2012-08-04: qty 2

## 2012-08-04 MED ORDER — CHLORDIAZEPOXIDE HCL 25 MG PO CAPS
25.0000 mg | ORAL_CAPSULE | Freq: Four times a day (QID) | ORAL | Status: AC
  Administered 2012-08-04 (×2): 25 mg via ORAL
  Filled 2012-08-04 (×2): qty 1

## 2012-08-04 MED ORDER — ADULT MULTIVITAMIN W/MINERALS CH
1.0000 | ORAL_TABLET | Freq: Every day | ORAL | Status: DC
  Administered 2012-08-04 – 2012-08-09 (×6): 1 via ORAL
  Filled 2012-08-04 (×8): qty 1

## 2012-08-04 MED ORDER — DIVALPROEX SODIUM ER 500 MG PO TB24
500.0000 mg | ORAL_TABLET | Freq: Every day | ORAL | Status: DC
  Administered 2012-08-04 – 2012-08-09 (×6): 500 mg via ORAL
  Filled 2012-08-04 (×7): qty 1
  Filled 2012-08-04: qty 12
  Filled 2012-08-04: qty 1

## 2012-08-04 MED ORDER — ACETAMINOPHEN 325 MG PO TABS
650.0000 mg | ORAL_TABLET | Freq: Four times a day (QID) | ORAL | Status: DC | PRN
  Administered 2012-08-05 – 2012-08-09 (×7): 650 mg via ORAL

## 2012-08-04 MED ORDER — VITAMIN B-1 100 MG PO TABS
100.0000 mg | ORAL_TABLET | Freq: Every day | ORAL | Status: DC
  Administered 2012-08-05 – 2012-08-09 (×5): 100 mg via ORAL
  Filled 2012-08-04 (×7): qty 1

## 2012-08-04 MED ORDER — HYDROXYZINE HCL 50 MG PO TABS
50.0000 mg | ORAL_TABLET | Freq: Every evening | ORAL | Status: DC | PRN
  Administered 2012-08-04 – 2012-08-09 (×5): 50 mg via ORAL
  Filled 2012-08-04: qty 1

## 2012-08-04 MED ORDER — METOPROLOL TARTRATE 50 MG PO TABS
50.0000 mg | ORAL_TABLET | Freq: Two times a day (BID) | ORAL | Status: DC
  Administered 2012-08-04 – 2012-08-09 (×10): 50 mg via ORAL
  Filled 2012-08-04 (×15): qty 1

## 2012-08-04 MED ORDER — ALUM & MAG HYDROXIDE-SIMETH 200-200-20 MG/5ML PO SUSP: 30.0000 mL | ORAL | Status: DC | PRN

## 2012-08-04 MED ORDER — LOPERAMIDE HCL 2 MG PO CAPS: 2.0000 mg | ORAL_CAPSULE | ORAL | Status: AC | PRN

## 2012-08-04 MED ORDER — METFORMIN HCL 500 MG PO TABS
500.0000 mg | ORAL_TABLET | Freq: Two times a day (BID) | ORAL | Status: DC
  Administered 2012-08-04 – 2012-08-09 (×10): 500 mg via ORAL
  Filled 2012-08-04 (×14): qty 1

## 2012-08-04 MED ORDER — THIAMINE HCL 100 MG/ML IJ SOLN
100.0000 mg | Freq: Once | INTRAMUSCULAR | Status: AC
  Administered 2012-08-04: 100 mg via INTRAMUSCULAR

## 2012-08-04 MED ORDER — MAGNESIUM HYDROXIDE 400 MG/5ML PO SUSP: 30.0000 mL | Freq: Every day | ORAL | Status: DC | PRN

## 2012-08-04 NOTE — ED Notes (Signed)
Report give--call approx [phone] to see if she can transport per [employer]

## 2012-08-04 NOTE — Progress Notes (Signed)
[facility] LCSW Group Therapy  08/04/2012 1:15 PM  Type of Therapy:  Group Therapy 1:15 to 2:30   Participation Level:  Did Not Attend   Lynn Palmer
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
## 2012-08-04 NOTE — ED Notes (Signed)
Pt vomited small amt-reports she ate too fast (has a lap band)--gater aid and apple sauce given

## 2012-08-04 NOTE — ED Notes (Signed)
Pt's boyfriend  Into see.  OK to transport per [employer]

## 2012-08-04 NOTE — ED Notes (Signed)
Pt has not had any alcohol since Tuesday AM per pt

## 2012-08-04 NOTE — ED Notes (Signed)
Nurse at [facility] unable to take report at this time, will call back at 0830

## 2012-08-04 NOTE — ED Notes (Signed)
Purple back pack placed in pt locker
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
## 2012-08-04 NOTE — ED Notes (Signed)
Security here to transport, pt ambulatory to [facility] w/ NT and belongings

## 2012-08-04 NOTE — ED Notes (Signed)
Do not transport x15 mins per [facility]

## 2012-08-04 NOTE — ED Notes (Signed)
Up to the desk on the phone 

## 2012-08-04 NOTE — [facility] Suicide Risk Assessment (Signed)
Suicide Risk Assessment  Admission Assessment     Nursing information obtained from:    Demographic factors:    Current Mental Status:    Loss Factors:    Historical Factors:    Risk Reduction Factors:     CLINICAL FACTORS:   Depression:   Comorbid alcohol abuse/dependence Impulsivity Alcohol/Substance Abuse/Dependencies  COGNITIVE FEATURES THAT CONTRIBUTE TO RISK:  Closed-mindedness Polarized thinking Thought constriction (tunnel vision)    SUICIDE RISK:   Moderate:  Frequent suicidal ideation with limited intensity, and duration, some specificity in terms of plans, no associated intent, good self-control, limited dysphoria/symptomatology, some risk factors present, and identifiable protective factors, including available and accessible social support.  PLAN OF CARE: Supportive approach/coping skills/relapse prevention                               [facility] Detox protocol                               Reassess co morbidities                               Continue Depakote/Seroquel  I certify that inpatient services furnished can reasonably be expected to improve the patient's condition.  Lynn Palmer A 08/04/2012, 2:18 PM

## 2012-08-04 NOTE — ED Notes (Signed)
Up to the bathroom 

## 2012-08-04 NOTE — Progress Notes (Signed)
Recreation Therapy Notes  Date: 04.24.2014 Time: 3:05pm Location: [facility]tyard      Group Topic/Focus: Goal Setting, Team Building  Participation Level: None  Participation Quality: N/A  Affect: Flat,   Cognitive: Appropriate  Additional Comments: Activity: Space Ball & Chain Link ; Explanation: [employer] - Patients were asked to set a goal for how far the could walk while holding a beach ball between their hips. Patients were instructed not to touch each other or the beach ball. Chain Link - Patients in teams of three were asked to set a goal for how many paperclips they could link together. Patients were required to work as a team. Individually patients could only use one hand. Patients were given approximately 10 minutes to complete this task.  Patient attended group session, but did not participate in group activities. LRT encouraged patient to participate. Patient stated "I don't feel good" while walking away from LRT and peers.   [name], LRT/CTRS  Lynn Palmer 08/04/2012 4:19 PM
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
## 2012-08-04 NOTE — ED Notes (Signed)
1 pt belongings bag and 1 back pk sent w/ pt

## 2012-08-04 NOTE — Progress Notes (Signed)
Pt accepted by [facility] Sp[facility] to [facility] to room 307/1

## 2012-08-05 MED ORDER — GABAPENTIN 100 MG PO CAPS
200.0000 mg | ORAL_CAPSULE | Freq: Every day | ORAL | Status: DC
  Administered 2012-08-05 – 2012-08-08 (×4): 200 mg via ORAL
  Filled 2012-08-05 (×5): qty 2
  Filled 2012-08-05: qty 1
  Filled 2012-08-05: qty 2

## 2012-08-05 MED ORDER — GABAPENTIN 100 MG PO CAPS
100.0000 mg | ORAL_CAPSULE | Freq: Two times a day (BID) | ORAL | Status: DC
  Administered 2012-08-05 – 2012-08-09 (×8): 100 mg via ORAL
  Filled 2012-08-05 (×3): qty 1
  Filled 2012-08-05: qty 16
  Filled 2012-08-05 (×7): qty 1
  Filled 2012-08-05: qty 16
  Filled 2012-08-05 (×2): qty 1

## 2012-08-05 NOTE — Tx Team (Signed)
Interdisciplinary Treatment Plan Update (Adult)  Date: 08/05/2012  Time Reviewed: 10:35 AM   Progress in Treatment: Attending groups: Yes Participating in groups: Yes Taking medication as prescribed:  Yes Tolerating medication:  Yes Family/Significant othe contact made: Not as yet Patient understands diagnosis: Yes Discussing patient identified problems/goals with staff: Yes Medical problems stabilized or resolved:  Yes Denies suicidal/homicidal ideation: No, still endorsing off and on Patient has not harmed self or Others: Yes  New problem(s) identified: None Identified  Discharge Plan or Barriers:  CSW is assessing for appropriate referrals.   Additional comments: N/A  Reason for Continuation of Hospitalization: Anxiety Depression Medication stabilization Suicidal ideation Withdrawal symptoms   Estimated length of stay: 3-4 days  For review of initial/current patient goals, please see plan of care.  Attendees: Patient:     Family:     Physician:  Geoffery Lyons 08/05/2012 10:35 AM   Nursing:   Leighton Parody, RN 08/05/2012 10:35 AM   Clinical Social Worker Ronda Fairly 08/05/2012 10:35 AM   Other:  Brigid Re, Pharmd [facility] 08/05/2012 10:35 AM   Other:  Dub Amis, Haroldine Laws Nursing Student 08/05/2012 10:35 AM   Other:  Maylon Peppers Nursing Student 08/05/2012 10:35 AM   Other: Liliane Bade, P4 08/05/2012 10:35 AM  Other: Harold Barban, RN 08/05/2012 10:35  AM  Other:  Serena Colonel, PA 08/05/2012 10:35 AM    Scribe for Treatment Team:   Carney Bern, LCSWA  08/05/2012 10:35 AM

## 2012-08-05 NOTE — [facility] Group Notes (Signed)
[facility] LCSW Aftercare Discharge Planning Group Note   08/05/2012 8:45  Participation Quality:  Appropriate  Mood/Affect:  Anxious, Depressed and Tearful  Depression Rating:  9  Anxiety Rating:  10  Thoughts of Suicide:  Yes Will you contract for safety?   Yes  Current AVH:  No  Plan for Discharge/Comments:  Patient requests referral to [facility]  Transportation Means:  uncertain  Supports: Boyfriend, AA  [place], Julious Payer

## 2012-08-05 NOTE — Progress Notes (Signed)
[facility] MD Progress Note  08/05/2012 1:06 PM Lynn Palmer  MRN:  [id number] Subjective:  Very upset, crying, she states that she saw Palmer staff member who is in Recovery and he is still in Recovery. They joined AA at the same time. She feels very ashamed of herself, very guilty. States that she is losing faith that she can do this. Admits to Palmer sense of  hopelessness, helpessness Diagnosis:  Alcohol Dependence, Bipolar Disorder  ADL's:  Intact  Sleep: Fair  Appetite:  Fair  Suicidal Ideation:  Plan:  denies Intent:  denies Means:  denies Homicidal Ideation:  Plan:  denies Intent:  denies Means:  denies AEB (as evidenced by):  Psychiatric Specialty Exam: Review of Systems  Constitutional: Negative.   HENT: Negative.   Eyes: Negative.   Respiratory: Negative.   Cardiovascular: Negative.   Gastrointestinal: Negative.   Genitourinary: Negative.   Musculoskeletal: Negative.   Skin: Negative.   Neurological: Negative.   Endo/Heme/Allergies: Negative.   Psychiatric/Behavioral: Positive for depression and substance abuse. The patient is nervous/anxious.     Blood pressure 143/94, pulse 75, temperature 98.5 F (36.9 C), temperature source Oral, resp. rate 18, height 5\' 1"  (1.549 m), weight 85.276 kg (188 lb), last menstrual period 07/29/2012.Body mass index is 35.54 kg/(m^2).  General Appearance: Fairly Groomed  Patent attorney::  Minimal  Speech:  Clear and Coherent  Volume:  fluctuates  Mood:  Anxious, Depressed, Hopeless, Worthless and frustrated  Affect:  Labile and Tearful  Thought Process:  Coherent and Goal Directed  Orientation:  Full (Time, Place, and Person)  Thought Content:  worreis, concerns  Suicidal Thoughts:  No  Homicidal Thoughts:  No  Memory:  Immediate;   Fair Recent;   Fair Remote;   Fair  Judgement:  Fair  Insight:  Present  Psychomotor Activity:  Restlessness  Concentration:  Fair  Recall:  Fair  Akathisia:  No  Handed:  Right  AIMS (if indicated):      Assets:  Financial Resources/Insurance  Sleep:  Number of Hours: 5.5   Current Medications: Current Facility-Administered Medications  Medication Dose Route Frequency Provider Last Rate Last Dose  . acetaminophen (TYLENOL) tablet 650 mg  650 mg Oral Q6H PRN Rachael Fee, MD   650 mg at 08/05/12 0956  . alum & mag hydroxide-simeth (MAALOX/MYLANTA) 200-200-20 MG/5ML suspension 30 mL  30 mL Oral Q4H PRN Rachael Fee, MD      . amLODipine (NORVASC) tablet 5 mg  5 mg Oral q morning - 10a Rachael Fee, MD   5 mg at 08/05/12 0954  . chlordiazePOXIDE (LIBRIUM) capsule 25 mg  25 mg Oral Q6H PRN Rachael Fee, MD      . chlordiazePOXIDE (LIBRIUM) capsule 25 mg  25 mg Oral TID Rachael Fee, MD   25 mg at 08/05/12 1241   Followed by  . [START ON 08/06/2012] chlordiazePOXIDE (LIBRIUM) capsule 25 mg  25 mg Oral BH-qamhs Rachael Fee, MD       Followed by  . [START ON 08/07/2012] chlordiazePOXIDE (LIBRIUM) capsule 25 mg  25 mg Oral Daily Rachael Fee, MD      . divalproex (DEPAKOTE ER) 24 hr tablet 1,000 mg  1,000 mg Oral QHS Rachael Fee, MD   1,000 mg at 08/04/12 2147  . divalproex (DEPAKOTE ER) 24 hr tablet 500 mg  500 mg Oral Daily Rachael Fee, MD   500 mg at 08/05/12 7829  . hydrOXYzine (ATARAX/VISTARIL) tablet 25  mg  25 mg Oral Q6H PRN Rachael Fee, MD   25 mg at 08/05/12 1241  . hydrOXYzine (ATARAX/VISTARIL) tablet 50 mg  50 mg Oral QHS PRN Rachael Fee, MD   50 mg at 08/04/12 2147  . loperamide (IMODIUM) capsule 2-4 mg  2-4 mg Oral PRN Rachael Fee, MD      . magnesium hydroxide (MILK OF MAGNESIA) suspension 30 mL  30 mL Oral Daily PRN Rachael Fee, MD      . metFORMIN (GLUCOPHAGE) tablet 500 mg  500 mg Oral BID WC Rachael Fee, MD   500 mg at 08/05/12 0813  . metoprolol (LOPRESSOR) tablet 50 mg  50 mg Oral BID Rachael Fee, MD   50 mg at 08/05/12 8657  . multivitamin with minerals tablet 1 tablet  1 tablet Oral Daily Rachael Fee, MD   1 tablet at 08/05/12 0813  .  norethindrone-ethinyl estradiol (TRIPHASIL,CYCLAFEM,ALYACEN) tablet 1 tablet  1 tablet Oral Daily Rachael Fee, MD   1 tablet at 08/05/12 0800  . ondansetron (ZOFRAN-ODT) disintegrating tablet 4 mg  4 mg Oral Q6H PRN Rachael Fee, MD      . simvastatin (ZOCOR) tablet 5 mg  5 mg Oral q1800 Rachael Fee, MD   5 mg at 08/04/12 1733  . thiamine (VITAMIN B-1) tablet 100 mg  100 mg Oral Daily Rachael Fee, MD   100 mg at 08/05/12 8469    Lab Results:  Results for orders placed during the hospital encounter of 08/04/12 (from the past 48 hour(s))  VALPROIC ACID LEVEL     Status: Abnormal   Collection Time    08/04/12  7:49 PM      Result Value Range   Valproic Acid Lvl 19.9 (*) 50.0 - 100.0 ug/mL    Physical Findings: AIMS: Facial and Oral Movements Muscles of Facial Expression: None, normal Lips and Perioral Area: None, normal Jaw: None, normal Tongue: None, normal,Extremity Movements Upper (arms, wrists, hands, fingers): None, normal Lower (legs, knees, ankles, toes): None, normal, Trunk Movements Neck, shoulders, hips: None, normal, Overall Severity Severity of abnormal movements (highest score from questions above): None, normal Incapacitation due to abnormal movements: None, normal Patient's awareness of abnormal movements (rate only patient's report): No Awareness, Dental Status Current problems with teeth and/or dentures?: No Does patient usually wear dentures?: No  CIWA:  CIWA-Ar Total: 1 COWS:     Treatment Plan Summary: Daily contact with patient to assess and evaluate symptoms and progress in treatment Medication management  Plan: Supportive approach/coping skills/relapse prevention           Continue Librium Detox protocol            CBT help re frame and challenge her distortions Medical Decision Making Problem Points:  Review of last therapy session (1) and Review of psycho-social stressors (1) Data Points:  Review of medication regiment & side effects (2)  I  certify that inpatient services furnished can reasonably be expected to improve the patient's condition.   Lynn Palmer 08/05/2012, 1:06 PM
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
## 2012-08-05 NOTE — Progress Notes (Signed)
[facility] LCSW Group Therapy  08/05/2012 1:15 PM  Type of Therapy:  Group Therapy 1:15 to 2:30  Participation Level:  Minimal  Participation Quality:  Attentive  Affect:  Depressed  Cognitive:  Alert and Oriented  Insight:  Limited  Engagement in Therapy:  Limited  Modes of Intervention:  Education, Exploration, Rapport Building, Socialization and Support  Summary of Progress/Problems: Group session included an educational portion on Post Acute Withdrawal Syndrome (PAWS).  Patients were able to process the information and share feelings related to length of time recovery takes. Brizza shared little related to relapse yet did respond to others who were questioning their own situation. Ariauna shared her experience with recovery.    Lynn Palmer

## 2012-08-05 NOTE — Progress Notes (Addendum)
Patient ID: Lynn Palmer, female   DOB: 1960/07/05, 52 y.o.   MRN: [id number] D. The patient spent the evening interacting in the milieu. She has an anxious mood and affect. Attended evening AA/NA group. C/o cold symptoms. Moist cough noted. A. Met with patient to assess. Verbal support and encouragement given. Encouraged to increase fluids. R. Actively participated in evening group. Slight tremor noted. Stated she felt anxious. Ciwa= 2

## 2012-08-05 NOTE — Progress Notes (Signed)
D: Patient appropriate and cooperative with staff and peers. Patient's affect/mood is labile; tearful at times. She reported on the self inventory sheet that her sleep is fair, appetite is good, energy level is normal and ability to pay attention is improving. Patient rated depression and feelings of hopelessness "10". She's attending groups and compliant with medications. Patient observed laughing and talking with peers on the unit, but sometimes will quickly begin to cry after having just spoke to someone.  A: Support and encouragement provided to patient. Administered scheduled medications per ordering MD. Monitor Q15 minute checks for safety.  R: Patient receptive. Passive SI, but contracts for safety. Denies HI and AVH. Patient remains safe on the unit.

## 2012-08-05 NOTE — Progress Notes (Signed)
Adult Psychoeducational Group Note  Date:  08/05/2012 Time:  12:17 PM  Group Topic/Focus:  Coping With Mental Health Crisis:   The purpose of this group is to help patients identify strategies for coping with mental health crisis.  Group discusses possible causes of crisis and ways to manage them effectively.  Participation Level:  Active  Participation Quality:  Appropriate  Affect:  Appropriate  Cognitive:  Alert and Appropriate  Insight: Good  Engagement in Group:  Engaged  Modes of Intervention:  Activity  Additional Comments:  Pt stated she is here because of substance abuse and depression.  Axel Frisk T 08/05/2012, 12:17 PM
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
## 2012-08-05 NOTE — Progress Notes (Signed)
D   Pt is anxious and irritable   She reports some mild to moderate withdrawal symptoms  She reports feeling depressed   She attended group with minimal participation A   Verbal support given  Medications administered and effectiveness monitored   Q 15 min checks R   Pt safe at present

## 2012-08-06 DIAGNOSIS — F1020 Alcohol dependence, uncomplicated: Principal | ICD-10-CM

## 2012-08-06 DIAGNOSIS — F319 Bipolar disorder, unspecified: Secondary | ICD-10-CM

## 2012-08-06 NOTE — Progress Notes (Signed)
D   Pt has been labile and tearful today   She has limited insight into her illness and is having trouble staying focused on her treatment  After seeing the pa today she became upset and packed her bags saying she wanted to leave  She was easily redirected and calmed down    A    Verbal support given    Medications administered and effectiveness monitored   Q 15 min checks R   Pt safe at present

## 2012-08-06 NOTE — Progress Notes (Signed)
Adult Psychoeducational Group Note  Date:  08/06/2012 Time:  0900  Group Topic/Focus:  Healthy Communication:   The focus of this group is to discuss communication, barriers to communication, as well as healthy ways to communicate with others.  Participation Level:  Active  Participation Quality:  Attentive, Intrusive, Redirectable and Sharing  Affect:  Anxious and Depressed  Cognitive:  Alert  Insight: Limited  Engagement in Group:  Engaged  Modes of Intervention:  Discussion and Education  Additional Comments:  Pt is concerned with seeing a PA this AM, but is attentive. Staff redirected.  Lynn Palmer Lynn Palmer 08/06/2012, 10:16 AM

## 2012-08-06 NOTE — Progress Notes (Signed)
Psychoeducational Group Note  Date:  08/06/2012 Time:  0945 am  Group Topic/Focus:  Identifying Needs:   The focus of this group is to help patients identify their personal needs that have been historically problematic and identify healthy behaviors to address their needs.  Participation Level:  Active  Participation Quality:  Appropriate  Affect:  Labile  Cognitive:  Alert  Insight:  Limited  Engagement in Group:  Limited  Additional Comments:    Andrena Mews 08/06/2012,3:15 PM
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
## 2012-08-06 NOTE — Progress Notes (Signed)
[facility] MD Progress Note  08/06/2012 1:59 PM Lynn Palmer  MRN:  [id number] Subjective:  Lynn Palmer complains that "they don't have many on the right medications." She states, when it was recommended that she go to a long-term treatment facility, "I can't do that, I just can't do that." She reports that in order to stay sober she needs to stay away from her daughter because "she drives me crazy." She states that she has a boyfriend who she loves, but then complains that "he has a family that takes care of him, I don't have the family that takes care of me." She denies any suicidal or homicidal ideation. She complains that she did not sleep well last night. Her appetite is poor.  Diagnosis:   Axis I: Alcohol Dependence, Bipolar Disorder Axis II: Cluster B Traits Axis III:  Past Medical History  Diagnosis Date  . Bipolar 1 disorder   . Obesity   . Diabetes mellitus   . Hyperlipidemia   . Hypertension   . Vomiting   . Depression   . Anxiety   . Substance abuse     alcohol   . Seizures     as a teenager    ADL's:  Intact  Sleep: Poor  Appetite:  Fair  Suicidal Ideation:  Patient denies any thought, plan, or intent Homicidal Ideation:  Patient denies any thought, plan, or intent AEB (as evidenced by):  Psychiatric Specialty Exam: Review of Systems  Constitutional: Negative.   HENT: Negative.   Eyes: Negative.   Respiratory: Negative.   Cardiovascular: Negative.   Gastrointestinal: Negative.   Genitourinary: Negative.   Musculoskeletal: Negative.   Skin: Negative.   Neurological: Negative.   Endo/Heme/Allergies: Negative.   Psychiatric/Behavioral: Positive for depression and substance abuse. Negative for suicidal ideas and hallucinations. The patient is nervous/anxious and has insomnia.     Blood pressure 139/96, pulse 76, temperature 97.8 F (36.6 C), temperature source Oral, resp. rate 18, height 5\' 1"  (1.549 m), weight 85.276 kg (188 lb), last menstrual period  07/29/2012.Body mass index is 35.54 kg/(m^2).  General Appearance: Casual  Eye Contact::  Fair  Speech:  Clear and Coherent  Volume:  Normal  Mood:  Depressed  Affect:  Tearful  Thought Process:  Irrelevant  Orientation:  Full (Time, Place, and Person)  Thought Content:  WDL  Suicidal Thoughts:  No  Homicidal Thoughts:  No  Memory:  Immediate;   Good Recent;   Good Remote;   Good  Judgement:  Impaired  Insight:  Lacking  Psychomotor Activity:  Normal  Concentration:  Good  Recall:  Good  Akathisia:  No  Handed:  Right  AIMS (if indicated):     Assets:  Resilience  Sleep:  Number of Hours: 4.75   Current Medications: Current Facility-Administered Medications  Medication Dose Route Frequency Provider Last Rate Last Dose  . acetaminophen (TYLENOL) tablet 650 mg  650 mg Oral Q6H PRN Rachael Fee, MD   650 mg at 08/06/12 0834  . alum & mag hydroxide-simeth (MAALOX/MYLANTA) 200-200-20 MG/5ML suspension 30 mL  30 mL Oral Q4H PRN Rachael Fee, MD      . amLODipine (NORVASC) tablet 5 mg  5 mg Oral q morning - 10a Rachael Fee, MD   5 mg at 08/06/12 0802  . chlordiazePOXIDE (LIBRIUM) capsule 25 mg  25 mg Oral Q6H PRN Rachael Fee, MD      . chlordiazePOXIDE (LIBRIUM) capsule 25 mg  25 mg Oral BH-qamhs Irving A  Dub Mikes, MD   25 mg at 08/06/12 0803   Followed by  . [START ON 08/07/2012] chlordiazePOXIDE (LIBRIUM) capsule 25 mg  25 mg Oral Daily Rachael Fee, MD      . divalproex (DEPAKOTE ER) 24 hr tablet 1,000 mg  1,000 mg Oral QHS Rachael Fee, MD   1,000 mg at 08/05/12 2205  . divalproex (DEPAKOTE ER) 24 hr tablet 500 mg  500 mg Oral Daily Rachael Fee, MD   500 mg at 08/06/12 0802  . gabapentin (NEURONTIN) capsule 100 mg  100 mg Oral BID Sanjuana Kava, NP   100 mg at 08/06/12 0802  . gabapentin (NEURONTIN) capsule 200 mg  200 mg Oral QHS Sanjuana Kava, NP   200 mg at 08/05/12 2208  . hydrOXYzine (ATARAX/VISTARIL) tablet 25 mg  25 mg Oral Q6H PRN Rachael Fee, MD   25 mg at  08/05/12 1241  . hydrOXYzine (ATARAX/VISTARIL) tablet 50 mg  50 mg Oral QHS PRN Rachael Fee, MD   50 mg at 08/05/12 2348  . loperamide (IMODIUM) capsule 2-4 mg  2-4 mg Oral PRN Rachael Fee, MD      . magnesium hydroxide (MILK OF MAGNESIA) suspension 30 mL  30 mL Oral Daily PRN Rachael Fee, MD      . metFORMIN (GLUCOPHAGE) tablet 500 mg  500 mg Oral BID WC Rachael Fee, MD   500 mg at 08/06/12 0802  . metoprolol (LOPRESSOR) tablet 50 mg  50 mg Oral BID Rachael Fee, MD   50 mg at 08/06/12 0802  . multivitamin with minerals tablet 1 tablet  1 tablet Oral Daily Rachael Fee, MD   1 tablet at 08/06/12 0802  . norethindrone-ethinyl estradiol (TRIPHASIL,CYCLAFEM,ALYACEN) tablet 1 tablet  1 tablet Oral Daily Rachael Fee, MD   1 tablet at 08/06/12 0825  . ondansetron (ZOFRAN-ODT) disintegrating tablet 4 mg  4 mg Oral Q6H PRN Rachael Fee, MD      . simvastatin (ZOCOR) tablet 5 mg  5 mg Oral q1800 Rachael Fee, MD   5 mg at 08/05/12 1707  . thiamine (VITAMIN B-1) tablet 100 mg  100 mg Oral Daily Rachael Fee, MD   100 mg at 08/06/12 5621    Lab Results:  Results for orders placed during the hospital encounter of 08/04/12 (from the past 48 hour(s))  VALPROIC ACID LEVEL     Status: Abnormal   Collection Time    08/04/12  7:49 PM      Result Value Range   Valproic Acid Lvl 19.9 (*) 50.0 - 100.0 ug/mL    Physical Findings: AIMS: Facial and Oral Movements Muscles of Facial Expression: None, normal Lips and Perioral Area: None, normal Jaw: None, normal Tongue: None, normal,Extremity Movements Upper (arms, wrists, hands, fingers): None, normal Lower (legs, knees, ankles, toes): None, normal, Trunk Movements Neck, shoulders, hips: None, normal, Overall Severity Severity of abnormal movements (highest score from questions above): None, normal Incapacitation due to abnormal movements: None, normal Patient's awareness of abnormal movements (rate only patient's report): No Awareness, Dental  Status Current problems with teeth and/or dentures?: No Does patient usually wear dentures?: No  CIWA:  CIWA-Ar Total: 3 COWS:     Treatment Plan Summary: Daily contact with patient to assess and evaluate symptoms and progress in treatment Medication management  Plan:  Medical Decision Making Problem Points:  Established problem, stable/improving (1), Review of last therapy session (1) and Review of  psycho-social stressors (1) Data Points:  Review or order clinical lab tests (1) Review and summation of old records (2) Review of medication regiment & side effects (2)  I certify that inpatient services furnished can reasonably be expected to improve the patient's condition.   WATT,ALAN 08/06/2012, 1:59 PM   Discussed with provider. Reviewed note. Agree with above findings, assessment and plan.   Jacqulyn Cane, M.D.  08/06/2012 7:56 PM

## 2012-08-06 NOTE — [facility] Group Notes (Cosign Needed)
[facility] Group Notes: (Clinical Social Work)   08/06/2012      Type of Therapy:  Group Therapy   Participation Level:  Did Not Attend    Brandolyn Shortridge Grossman-Orr, LCSW 08/06/2012, 1:16 PM      

## 2012-08-07 MED ORDER — QUETIAPINE FUMARATE 100 MG PO TABS
100.0000 mg | ORAL_TABLET | Freq: Every day | ORAL | Status: DC
  Administered 2012-08-07 – 2012-08-08 (×2): 100 mg via ORAL
  Filled 2012-08-07 (×2): qty 1
  Filled 2012-08-07: qty 4
  Filled 2012-08-07 (×2): qty 1

## 2012-08-07 MED ORDER — LORATADINE 10 MG PO TABS
10.0000 mg | ORAL_TABLET | Freq: Every day | ORAL | Status: DC
  Administered 2012-08-07 – 2012-08-09 (×3): 10 mg via ORAL
  Filled 2012-08-07 (×6): qty 1

## 2012-08-07 MED ORDER — GUAIFENESIN 100 MG/5ML PO SOLN
10.0000 mL | ORAL | Status: DC | PRN
  Administered 2012-08-07 – 2012-08-09 (×4): 200 mg via ORAL

## 2012-08-07 MED ORDER — TRAZODONE HCL 100 MG PO TABS
100.0000 mg | ORAL_TABLET | Freq: Every day | ORAL | Status: DC
  Administered 2012-08-07 – 2012-08-08 (×2): 100 mg via ORAL
  Filled 2012-08-07: qty 4
  Filled 2012-08-07 (×4): qty 1

## 2012-08-07 NOTE — Progress Notes (Signed)
Adult Psychoeducational Group Note  Date:  08/07/2012 Time:  0830  Group Topic/Focus:  Self inventory sheet  Participation Level:  Active  Participation Quality:  Appropriate  Affect:  Appropriate  Cognitive:  Appropriate  Insight: Appropriate  Engagement in Group:  Engaged  Modes of Intervention:  Discussion   Roselee Culver 08/07/2012, 9:39 AM

## 2012-08-07 NOTE — Progress Notes (Addendum)
Patient ID: Lynn Palmer, female   DOB: 10/27/1960, 52 y.o.   MRN: [id number] [facility] MD Progress Note  08/07/2012 2:05 PM Lynn Palmer  MRN:  [id number]  Subjective: "I don't think that I'm on my right medicines. I am neither getting my Trazodone nor my Seroquel. My mind won't be still without my Seroquel. I have not been sleeping a wink at night because I don't have my right medicines. I have bipolar illness. I was given Vistaril last night, that is not going to help, I know that, but no one will listen to me. I feel depressed because I'm not sleeping. I keep coughing at night, my nose runs during the day. I feel like I have STD. My boyfriend is showing symptoms. We have been partying, drinking and doing some crazy stuff. I need to be checked for STDs".  Diagnosis:   Axis I: Alcohol Dependence, Bipolar Disorder Axis II: Cluster B Traits Axis III:  Past Medical History  Diagnosis Date  . Bipolar 1 disorder   . Obesity   . Diabetes mellitus   . Hyperlipidemia   . Hypertension   . Vomiting   . Depression   . Anxiety   . Substance abuse     alcohol   . Seizures     as a teenager    ADL's:  Intact  Sleep: Poor  Appetite:  Fair  Suicidal Ideation:  Patient denies any thought, plan, or intent Homicidal Ideation:  Patient denies any thought, plan, or intent AEB (as evidenced by):  Psychiatric Specialty Exam: Review of Systems  Constitutional: Negative.   HENT: Negative.   Eyes: Negative.   Respiratory: Negative.   Cardiovascular: Negative.   Gastrointestinal: Negative.   Genitourinary: Negative.   Musculoskeletal: Negative.   Skin: Negative.   Neurological: Negative.   Endo/Heme/Allergies: Negative.   Psychiatric/Behavioral: Positive for depression and substance abuse. Negative for suicidal ideas and hallucinations. The patient is nervous/anxious and has insomnia.     Blood pressure 154/92, pulse 75, temperature 99.1 F (37.3 C), temperature source Oral, resp. rate  20, height 5\' 1"  (1.549 m), weight 85.276 kg (188 lb), last menstrual period 07/29/2012.Body mass index is 35.54 kg/(m^2).  General Appearance: Casual  Eye Contact::  Fair  Speech:  Clear and Coherent  Volume:  Normal  Mood:  Depressed  Affect:  Tearful  Thought Process:  Irrelevant  Orientation:  Full (Time, Place, and Person)  Thought Content:  WDL  Suicidal Thoughts:  No  Homicidal Thoughts:  No  Memory:  Immediate;   Good Recent;   Good Remote;   Good  Judgement:  Impaired  Insight:  Lacking  Psychomotor Activity:  Normal  Concentration:  Good  Recall:  Good  Akathisia:  No  Handed:  Right  AIMS (if indicated):     Assets:  Resilience  Sleep:  Number of Hours: 3.25   Current Medications: Current Facility-Administered Medications  Medication Dose Route Frequency Provider Last Rate Last Dose  . acetaminophen (TYLENOL) tablet 650 mg  650 mg Oral Q6H PRN Rachael Fee, MD   650 mg at 08/07/12 [phone]  . alum & mag hydroxide-simeth (MAALOX/MYLANTA) 200-200-20 MG/5ML suspension 30 mL  30 mL Oral Q4H PRN Rachael Fee, MD      . amLODipine (NORVASC) tablet 5 mg  5 mg Oral q morning - 10a Rachael Fee, MD   5 mg at 08/07/12 0810  . divalproex (DEPAKOTE ER) 24 hr tablet 1,000 mg  1,000 mg Oral  QHS Rachael Fee, MD   1,000 mg at 08/06/12 2205  . divalproex (DEPAKOTE ER) 24 hr tablet 500 mg  500 mg Oral Daily Rachael Fee, MD   500 mg at 08/07/12 0810  . gabapentin (NEURONTIN) capsule 100 mg  100 mg Oral BID Sanjuana Kava, NP   100 mg at 08/07/12 0810  . gabapentin (NEURONTIN) capsule 200 mg  200 mg Oral QHS Sanjuana Kava, NP   200 mg at 08/06/12 2205  . hydrOXYzine (ATARAX/VISTARIL) tablet 50 mg  50 mg Oral QHS PRN Rachael Fee, MD   50 mg at 08/07/12 0047  . magnesium hydroxide (MILK OF MAGNESIA) suspension 30 mL  30 mL Oral Daily PRN Rachael Fee, MD      . metFORMIN (GLUCOPHAGE) tablet 500 mg  500 mg Oral BID WC Rachael Fee, MD   500 mg at 08/07/12 0810  . metoprolol  (LOPRESSOR) tablet 50 mg  50 mg Oral BID Rachael Fee, MD   50 mg at 08/07/12 0810  . multivitamin with minerals tablet 1 tablet  1 tablet Oral Daily Rachael Fee, MD   1 tablet at 08/07/12 0810  . norethindrone-ethinyl estradiol (TRIPHASIL,CYCLAFEM,ALYACEN) tablet 1 tablet  1 tablet Oral Daily Rachael Fee, MD   1 tablet at 08/07/12 [phone]  . simvastatin (ZOCOR) tablet 5 mg  5 mg Oral q1800 Rachael Fee, MD   5 mg at 08/06/12 1809  . thiamine (VITAMIN B-1) tablet 100 mg  100 mg Oral Daily Rachael Fee, MD   100 mg at 08/07/12 9604    Lab Results:  No results found for this or any previous visit (from the past 48 hour(s)).  Physical Findings: AIMS: Facial and Oral Movements Muscles of Facial Expression: None, normal Lips and Perioral Area: None, normal Jaw: None, normal Tongue: None, normal,Extremity Movements Upper (arms, wrists, hands, fingers): None, normal Lower (legs, knees, ankles, toes): None, normal, Trunk Movements Neck, shoulders, hips: None, normal, Overall Severity Severity of abnormal movements (highest score from questions above): None, normal Incapacitation due to abnormal movements: None, normal Patient's awareness of abnormal movements (rate only patient's report): No Awareness, Dental Status Current problems with teeth and/or dentures?: No Does patient usually wear dentures?: No  CIWA:  CIWA-Ar Total: 1 COWS:     Treatment Plan Summary: Daily contact with patient to assess and evaluate symptoms and progress in treatment Medication management  Plan: Supportive approach/coping skills/relapse prevention. Initiate Trazodone 100 mg Q sleep Seroquel 100 mg Q bedtime for mood control. Claritin 10 mg daily, Robitussin soln 10 mg Q 4 hours prn for cough. Check for STDs Encouraged out of room, participation in group sessions and application of coping skills when distressed. Will continue to monitor response to/adverse effects of medications in use to assure  effectiveness. Continue to monitor mood, behavior and interaction with staff and other patients. Continue current plan of care.  Medical Decision Making Problem Points:  Established problem, stable/improving (1), Review of last therapy session (1) and Review of psycho-social stressors (1) Data Points:  Review or order clinical lab tests (1) Review and summation of old records (2) Review of medication regiment & side effects (2)  I certify that inpatient services furnished can reasonably be expected to improve the patient's condition.   Armandina Stammer I 08/07/2012, 2:05 PM   Discussed with provider. Reviewed note. Agree with above findings, assessment and plan. Seroquel can be continued as long as the patient's blood sugars are  under control and she is compliant with an ADA diet ALL recommendations by her PCP.  Jacqulyn Cane, M.D.  08/07/2012 8:47 PM

## 2012-08-07 NOTE — [facility] Group Notes (Signed)
[facility] Group Notes:  (Clinical Social Work)  08/07/2012  10:00-11:00AM  Summary of Progress/Problems:   The main focus of today's process group was to   identify the patient's current support system and decide on other supports that can be put in place.  Four definitions/levels of support were discussed and an exercise was utilized to show how much stronger we become with additional supports.  An emphasis was placed on using counselor, doctor, therapy groups, 12-step groups, and problem-specific support groups to expand supports, as well as doing something different than has been done before. The patient expressed a willingness to add more than 1 meeting a day at AA/NA, and she plans to change to a stricter sponsor  She is going to call 5 people a day in her group, and do service work.  Patient missed first 40 minutes of group.  Type of Therapy:  Process Group with Motivational Interviewing  Participation Level:  Active  Participation Quality:  Attentive  Affect:  Appropriate and Blunted  Cognitive:  Appropriate  Insight:  Developing/Improving  Engagement in Therapy:  Developing/Improving  Modes of Intervention:   Clarification, Education, Support and Processing, Activity  Ambrose Mantle, LCSW 08/07/2012, 12:21 PM

## 2012-08-07 NOTE — Progress Notes (Signed)
D   Pt has been a little less irritable but can be demanding when things dont go exactly like she wants them   She attends and participates in groups   She is compliant with treatment and has requested to be tested for STDs   hiv consent form is not available so this will have to carry over for tomorrow   A   Verbal support given  Medications administered and effectiveness monitored   Q 15 min checks R   Pt safe at present

## 2012-08-07 NOTE — Progress Notes (Signed)
Psychoeducational Group Note  Date:  08/07/2012 Time:  0945 am  Group Topic/Focus:  Making Healthy Choices:   The focus of this group is to help patients identify negative/unhealthy choices they were using prior to admission and identify positive/healthier coping strategies to replace them upon discharge.  Participation Level:  Active  Participation Quality:  Appropriate  Affect:  Irritable  Cognitive:  Alert  Insight:  Developing/Improving  Engagement in Group:  Developing/Improving  Additional Comments:    Andrena Mews 08/07/2012, 10:29 AM

## 2012-08-07 NOTE — Progress Notes (Signed)
Patient ID: Lynn Palmer, female   DOB: Jan 26, 1961, 52 y.o.   MRN: [id number] D. The patient spent the evening talking on the phone. Stated that she and her boyfriend were busy making plans. Does not want to go to a long term treatment center because she is afraid she will loose her boyfriend. Attended evening AA/Na group. A. Met with patient briefly in between her phone calls. Briefly discussed her discharge plans for recovery. Reviewed and administered her medications. R. Limited insight regarding her recovery. Compliant with medications.

## 2012-08-07 NOTE — Progress Notes (Signed)
Adult Psychoeducational Group Note  Date:  08/07/2012 Time:  5:27 PM  Group Topic/Focus:  Crisis Planning:   The purpose of this group is to help patients create a crisis plan for use upon discharge or in the future, as needed.  Participation Level:  Did Not Attend  Participation Quality:    Affect:    Cognitive:    Insight:   Engagement in Group:    Modes of Intervention:    Additional Comments:  Pt didn't attend.  Isla Pence M 08/07/2012, 5:27 PM

## 2012-08-08 LAB — GC/CHLAMYDIA PROBE AMP
CT Probe RNA: NEGATIVE
GC Probe RNA: NEGATIVE

## 2012-08-08 NOTE — Progress Notes (Signed)
Patient did attend the evening speaker AA meeting.  

## 2012-08-08 NOTE — Progress Notes (Signed)
Pt did attend AA group throughout its entirety; appeared anxious but attentive.

## 2012-08-08 NOTE — Progress Notes (Signed)
Chaplain follow up for continued support - especially around discharge plans and relationship with boyfriend.   Pt anxious that boyfriend will leave her if she attends longer-term treatments.  Spoke with chaplain about claiming what she wants and recognizing "trust issues" she carries.  Encouraged follow up with outpatient therapist about relationship dynamics.  Provided empathic presence and emotional support.  Spoke with pt about faith history - an element that has figured prominently in her periods of sobriety in the past and goals for finding supportive community that functions for her needs.

## 2012-08-08 NOTE — [facility] Group Notes (Signed)
[facility] LCSW Aftercare Discharge Planning Group Note   08/08/2012 8:45 AM  Participation Quality:  Appropriate  Mood/Affect:  Appropriate  Depression Rating:  0  Anxiety Rating:  0  Thoughts of Suicide:  No Will you contract for safety?   NA  Current AVH:  No  Plan for Discharge/Comments:  Discharge to Mother's, followup at [facility] for medication Management and go to [facility] in May for Treatment bed; CSW to call for bed date  Transportation Means:  Bus  Supports: Mother, boyfriend, AA supports  Dyane Dustman, Julious Payer

## 2012-08-08 NOTE — Progress Notes (Signed)
Adult Psychoeducational Group Note  Date:  08/08/2012 Time:  2:11 PM  Group Topic/Focus:  Self Care:   The focus of this group is to help patients understand the importance of self-care in order to improve or restore emotional, physical, spiritual, interpersonal, and financial health.  Participation Level:  Active  Participation Quality:  Appropriate and Attentive  Affect:  Appropriate  Cognitive:  Appropriate  Insight: Appropriate and Good  Engagement in Group:  Engaged  Modes of Intervention:  Activity and Education  Additional Comments:  Pt. Participated in self-care assessment and identified areas of strength and room for improvement. Pt. Set goal for how to improve self-care.   Ruta Hinds [place] 08/08/2012, 2:11 PM

## 2012-08-08 NOTE — Progress Notes (Signed)
Patient ID: Lynn Palmer, female   DOB: [birth year], 52 y.o.   MRN: [id number] She has been up and to groups today interacting with peers and staff. Stated" that she is feeling better and that she plans to go live with her mother on discharge. Self inventory: depression 2, hopelessness 1, no withdrawal symptoms, did c/o back hurting today and a PRN was given and was effective.

## 2012-08-08 NOTE — Progress Notes (Signed)
[facility] MD Progress Note  08/08/2012 3:55 PM Lynn Palmer  MRN:  [id number] Subjective:  Expresses a lot of worries and concerns about her ability to stay sober. She is also concerned about having an STD as her boyfriend seems to be having some symptoms. She asked to be tested. She has continued to work her relapse prevention plan. Dealing with a lot of shame and guilt for the relapse Diagnosis:  Alcohol Dependence, Bipolar Disoder  ADL's:  Intact  Sleep: Fair  Appetite:  Fair  Suicidal Ideation:  Plan:  denies Intent:  denies Means:  denies Homicidal Ideation:  Plan:  denies Intent:  denies Means:  denies AEB (as evidenced by):  Psychiatric Specialty Exam: Review of Systems  Psychiatric/Behavioral: Positive for substance abuse. The patient is nervous/anxious.     Blood pressure 114/77, pulse 89, temperature 99 F (37.2 C), temperature source Oral, resp. rate 18, height 5\' 1"  (1.549 m), weight 85.276 kg (188 lb), last menstrual period 07/29/2012.Body mass index is 35.54 kg/(m^2).  General Appearance: Fairly Groomed  Patent attorney::  Fair  Speech:  Clear and Coherent  Volume:  Normal  Mood:  Anxious and worried  Affect:  anxious  Thought Process:  Coherent and Goal Directed  Orientation:  Full (Time, Place, and Person)  Thought Content:  Rumination and worries, concerns  Suicidal Thoughts:  No  Homicidal Thoughts:  No  Memory:  Immediate;   Fair Recent;   Fair Remote;   Fair  Judgement:  Fair  Insight:  Shallow  Psychomotor Activity:  Restlessness  Concentration:  Fair  Recall:  Fair  Akathisia:  No  Handed:  Right  AIMS (if indicated):     Assets:  Desire for Improvement  Sleep:  Number of Hours: 4   Current Medications: Current Facility-Administered Medications  Medication Dose Route Frequency Provider Last Rate Last Dose  . acetaminophen (TYLENOL) tablet 650 mg  650 mg Oral Q6H PRN Rachael Fee, MD   650 mg at 08/08/12 0824  . alum & mag hydroxide-simeth  (MAALOX/MYLANTA) 200-200-20 MG/5ML suspension 30 mL  30 mL Oral Q4H PRN Rachael Fee, MD      . amLODipine (NORVASC) tablet 5 mg  5 mg Oral q morning - 10a Rachael Fee, MD   5 mg at 08/08/12 [phone]  . divalproex (DEPAKOTE ER) 24 hr tablet 1,000 mg  1,000 mg Oral QHS Rachael Fee, MD   1,000 mg at 08/07/12 2226  . divalproex (DEPAKOTE ER) 24 hr tablet 500 mg  500 mg Oral Daily Rachael Fee, MD   500 mg at 08/08/12 0820  . gabapentin (NEURONTIN) capsule 100 mg  100 mg Oral BID Sanjuana Kava, NP   100 mg at 08/08/12 1191  . gabapentin (NEURONTIN) capsule 200 mg  200 mg Oral QHS Sanjuana Kava, NP   200 mg at 08/07/12 2227  . guaiFENesin (ROBITUSSIN) 100 MG/5ML solution 200 mg  10 mL Oral Q4H PRN Sanjuana Kava, NP   200 mg at 08/08/12 0824  . hydrOXYzine (ATARAX/VISTARIL) tablet 50 mg  50 mg Oral QHS PRN Rachael Fee, MD   50 mg at 08/08/12 0205  . loratadine (CLARITIN) tablet 10 mg  10 mg Oral Daily Sanjuana Kava, NP   10 mg at 08/08/12 4782  . magnesium hydroxide (MILK OF MAGNESIA) suspension 30 mL  30 mL Oral Daily PRN Rachael Fee, MD      . metFORMIN (GLUCOPHAGE) tablet 500 mg  500  mg Oral BID WC Rachael Fee, MD   500 mg at 08/08/12 1610  . metoprolol (LOPRESSOR) tablet 50 mg  50 mg Oral BID Rachael Fee, MD   50 mg at 08/08/12 0820  . multivitamin with minerals tablet 1 tablet  1 tablet Oral Daily Rachael Fee, MD   1 tablet at 08/08/12 0820  . norethindrone-ethinyl estradiol (TRIPHASIL,CYCLAFEM,ALYACEN) tablet 1 tablet  1 tablet Oral Daily Rachael Fee, MD   1 tablet at 08/08/12 0827  . QUEtiapine (SEROQUEL) tablet 100 mg  100 mg Oral QHS Sanjuana Kava, NP   100 mg at 08/07/12 2227  . simvastatin (ZOCOR) tablet 5 mg  5 mg Oral q1800 Rachael Fee, MD   5 mg at 08/07/12 1814  . thiamine (VITAMIN B-1) tablet 100 mg  100 mg Oral Daily Rachael Fee, MD   100 mg at 08/08/12 9604  . traZODone (DESYREL) tablet 100 mg  100 mg Oral QHS Sanjuana Kava, NP   100 mg at 08/07/12 2227    Lab  Results:    Physical Findings:  AIMS: Facial and Oral Movements Muscles of Facial Expression: None, normal Lips and Perioral Area: None, normal Jaw: None, normal Tongue: None, normal,Extremity Movements Upper (arms, wrists, hands, fingers): None, normal Lower (legs, knees, ankles, toes): None, normal, Trunk Movements Neck, shoulders, hips: None, normal, Overall Severity Severity of abnormal movements (highest score from questions above): None, normal Incapacitation due to abnormal movements: None, normal Patient's awareness of abnormal movements (rate only patient's report): No Awareness, Dental Status Current problems with teeth and/or dentures?: No Does patient usually wear dentures?: No  CIWA:  CIWA-Ar Total: 0 COWS:     Treatment Plan Summary: Daily contact with patient to assess and evaluate symptoms and progress in treatment Medication management  Plan: Supportive approach/coping skills/relapsr prevention/CBT  Medical Decision Making Problem Points:  Review of last therapy session (1) and Review of psycho-social stressors (1) Data Points:  Review of medication regiment & side effects (2)  I certify that inpatient services furnished can reasonably be expected to improve the patient's condition.   Lashonda Sonneborn A 08/08/2012, 3:55 PM

## 2012-08-08 NOTE — Progress Notes (Signed)
[facility] LCSW Group Therapy  08/08/2012 1:15 PM  Type of Therapy:  Group Therapy 1:15 to 2:30 PM  Participation Level:  Active   Participation Quality:  Appropriate, Attentive and Sharing   Affect: Depressed  Cognitive:  Appropriate  Insight:  Developing/Improving  Engagement in Therapy:   Engaged  Modes of Intervention:  Discussion, Exploration, Rapport Building, Dance movement psychotherapist, Socialization and Support  Summary of Progress/Problems:  Group discussion focused on what patient's see as their own obstacles to recovery.  Patient shares belief that "my own thinking will be difficult to deal, it gets in my way often." Patient was able to process how PA whom she has known over 20 years suggested she needed long term treatment and "I went off, I was cussing and crying for a good three hours and I know I would have used if I had not been in here."  Patient processed "that was just my stubborn thinking and ego."  Harrill, Julious Payer

## 2012-08-09 MED ORDER — ACETAMINOPHEN 325 MG PO TABS: 650.0000 mg | ORAL_TABLET | Freq: Four times a day (QID) | ORAL | Status: DC | PRN

## 2012-08-09 MED ORDER — GABAPENTIN 100 MG PO CAPS: ORAL_CAPSULE | ORAL | Status: DC

## 2012-08-09 MED ORDER — METOPROLOL TARTRATE 50 MG PO TABS: 50.0000 mg | ORAL_TABLET | Freq: Two times a day (BID) | ORAL | Status: DC

## 2012-08-09 MED ORDER — LORATADINE 10 MG PO TABS: 10.0000 mg | ORAL_TABLET | Freq: Every day | ORAL | Status: DC

## 2012-08-09 MED ORDER — ADULT MULTIVITAMIN W/MINERALS CH: 1.0000 | ORAL_TABLET | Freq: Every morning | ORAL | Status: DC

## 2012-08-09 MED ORDER — AMLODIPINE BESYLATE 5 MG PO TABS: 5.0000 mg | ORAL_TABLET | Freq: Every morning | ORAL | Status: DC

## 2012-08-09 MED ORDER — PRAVASTATIN SODIUM 20 MG PO TABS: 20.0000 mg | ORAL_TABLET | Freq: Every evening | ORAL | Status: DC

## 2012-08-09 MED ORDER — DIVALPROEX SODIUM ER 500 MG PO TB24: ORAL_TABLET | ORAL | Status: DC

## 2012-08-09 MED ORDER — METFORMIN HCL 500 MG PO TABS: 500.0000 mg | ORAL_TABLET | Freq: Two times a day (BID) | ORAL | Status: DC

## 2012-08-09 MED ORDER — TRAZODONE HCL 100 MG PO TABS: 100.0000 mg | ORAL_TABLET | Freq: Every day | ORAL | Status: DC

## 2012-08-09 MED ORDER — QUETIAPINE FUMARATE 100 MG PO TABS: 100.0000 mg | ORAL_TABLET | Freq: Every day | ORAL | Status: DC

## 2012-08-09 MED ORDER — NORETHIN-ETH ESTRAD TRIPHASIC 0.5/0.75/1-35 MG-MCG PO TABS: 1.0000 | ORAL_TABLET | Freq: Every day | ORAL | Status: DC

## 2012-08-09 NOTE — [facility] Group Notes (Signed)
[facility] LCSW Aftercare Discharge Planning Group Note   08/09/2012 8:45 AM  Participation Quality:  Appropriate  Mood/Affect:  Appropriate  Depression Rating:  0  Anxiety Rating:  0  Thoughts of Suicide:  No Will you contract for safety?   NA  Current AVH:  No  Plan for Discharge/Comments:  Follow up at [facility] for medication management, treatment bed date at [facility] 5/13; Meanwhile patient reports she will attend AA meetings daily.   Transportation Means:  Bus  Supports: Family, boyfriend, AA fellowship  Dyane Dustman, Julious Payer

## 2012-08-09 NOTE — [facility] Suicide Risk Assessment (Signed)
[facility] INPATIENT:  Family/Significant Other Suicide Prevention Education - Late Entry  Suicide Prevention Education:  Patient Refusal for Family/Significant Other Suicide Prevention Education: The patient Lynn Palmer has refused to provide written consent for family/significant other to be provided Family/Significant Other Suicide Prevention Education during admission and/or prior to discharge.  Physician notified.  Writer provided suicide prevention education directly to patient; conversation included risk factors, warning signs and resources to contact for help. Mobile crisis services explained; patient aware of contact information.   Carney Bern, LCSWA  08/09/2012 11:00 AM

## 2012-08-09 NOTE — Progress Notes (Signed)
Patient ID: Lynn Palmer, female   DOB: 12-29-1960, 52 y.o.   MRN: [id number]  D: Pt was pleasant but anxious during the adm process. Informed the writer that "she was not trying to kill herself."  Stated that she and her boyfriend are "working out" their problems. Attempted to show the writer her ring, however, pt didn't have it on. Pt stated she was happy and excited about the situation.  A:  Support and encouragement was offered. 15 min checks continued for safety.  R: Pt remains safe.
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
## 2012-08-09 NOTE — [facility] Suicide Risk Assessment (Signed)
Suicide Risk Assessment  Discharge Assessment     Demographic Factors:  Caucasian  Mental Status Per Nursing Assessment::   On Admission:     Current Mental Status by Physician: In full contact with reality. There are no suicidal ideas plans or intent. Her mood is euthymic, her affect is appropriate. She is willing and motivated to pursue outpatient treatment. Committed to abstinence   Loss Factors: NA  Historical Factors: NA  Risk Reduction Factors:   Living with another person, especially a relative and Positive social support  Continued Clinical Symptoms:  Depression:   Comorbid alcohol abuse/dependence Alcohol/Substance Abuse/Dependencies  Cognitive Features That Contribute To Risk: no evidence   Suicide Risk:  Minimal: No identifiable suicidal ideation.  Patients presenting with no risk factors but with morbid ruminations; may be classified as minimal risk based on the severity of the depressive symptoms  Discharge Diagnoses:   AXIS I:  Alcohol Dependence, Bipolar Disorder AXIS II:  Deferred AXIS III:   Past Medical History  Diagnosis Date  . Bipolar 1 disorder   . Obesity   . Diabetes mellitus   . Hyperlipidemia   . Hypertension   . Vomiting   . Depression   . Anxiety   . Substance abuse     alcohol   . Seizures     as a teenager   AXIS IV:  other psychosocial or environmental problems AXIS V:  61-70 mild symptoms  Plan Of Care/Follow-up recommendations:  Activity:  as tolerated Diet:  regular [name] Is patient on multiple antipsychotic therapies at discharge:  No   Has Patient had three or more failed trials of antipsychotic monotherapy by history:  No  Recommended Plan for Multiple Antipsychotic Therapies: N/A   Lynn Palmer A 08/09/2012, 10:04 AM

## 2012-08-09 NOTE — Progress Notes (Signed)
Patient ID: Lynn Palmer, female   DOB: 02/16/1961, 52 y.o.   MRN: [id number] She has been discharged home and was picked up by her boyfriend, they were going to her mother home and the to meeting this afternoon. She voiced understanding of discharge instruction and of follow up plan. He denies thoughts of SI and all her belongings were taken home with her.

## 2012-08-09 NOTE — Progress Notes (Signed)
[facility] Adult Case Management Discharge Plan :  Will you be returning to the same living situation after discharge:No, patient will be discharging to mother's home. At discharge, do you have transportation home?:Yes Do you have the ability to pay for your medications:Yes, patient uses [employer]  Release of information consent forms completed and in the chart;  Patient's signature needed at discharge.  Patient to Follow up at: Follow-up Information   Follow up with [facility] On 08/23/2012. (Be at [facility] on Tuesday May 13th no later than 8 AM: Make certain to have with you a months' supply of medication. )    Contact information:   [address] [place] [address] [facility] [phone] FAX [phone]      Follow up with [facility]. (Go to [facility] this week on either Wed, Thurs or Friday between the hours of 8AM and 3PM. Future appointment will be scheduled. )    Contact information:   [address] [place], [place] [address] [facility] [phone] Valinda Hoar [phone]      Patient denies SI/HI: Denies both  Safety Planning and Suicide Prevention discussed: Yes with patient  Clide Dales 08/09/2012, 1:00 PM

## 2012-08-09 NOTE — Progress Notes (Signed)
Date: 08/09/2012  Time: 11:18 AM  Group Topic/Focus:  Recovery Goals: The focus of this group is to identify appropriate goals for recovery and establish a plan to achieve them.  Participation Level: Minimal  Participation Quality: Appropriate, Sharing and Supportive  Affect: Appropriate  Cognitive: Appropriate  Insight: Appropriate  Engagement in Group: Engaged and Supportive  Modes of Intervention: Education, Socialization and Support  Additional Comments: Pt was in group, pt was in group, pt was appropriate, pt is excited about going home. Lynn Palmer M  08/09/2012, 11:18 AM

## 2012-08-11 MED ORDER — AMLODIPINE BESYLATE 5 MG PO TABS: 5.0000 mg | ORAL_TABLET | Freq: Every morning | ORAL | Status: DC

## 2012-08-12 NOTE — Progress Notes (Signed)
Patient Discharge Instructions:  After Visit Summary (AVS):   Faxed to:  08/12/12 Discharge Summary Note:   Faxed to:  08/12/12 Suicide Risk Assessment - Discharge Assessment:   Faxed to:  08/12/12 Faxed/Sent to the Next Level Care provider:  08/12/12 Faxed to [facility] @ [phone] Faxed to [facility] @ [phone]  Jerelene Redden, 08/12/2012, 4:07 PM

## 2012-09-19 ENCOUNTER — Emergency Department (HOSPITAL_COMMUNITY)
Admission: EM | Admit: 2012-09-19 | Discharge: 2012-09-19 | Disposition: A | Discharge status: Home / Self Care | Payer: Medicare Other | Attending: Emergency Medicine | Admitting: Emergency Medicine

## 2012-09-19 ENCOUNTER — Emergency Department (HOSPITAL_COMMUNITY): Payer: Medicare Other

## 2012-09-19 ENCOUNTER — Encounter (HOSPITAL_COMMUNITY): Payer: Self-pay | Admitting: Emergency Medicine

## 2012-09-19 DIAGNOSIS — N390 Urinary tract infection, site not specified: Secondary | ICD-10-CM | POA: Insufficient documentation

## 2012-09-19 DIAGNOSIS — M545 Low back pain, unspecified: Secondary | ICD-10-CM | POA: Insufficient documentation

## 2012-09-19 DIAGNOSIS — E669 Obesity, unspecified: Secondary | ICD-10-CM | POA: Insufficient documentation

## 2012-09-19 DIAGNOSIS — E119 Type 2 diabetes mellitus without complications: Secondary | ICD-10-CM | POA: Insufficient documentation

## 2012-09-19 DIAGNOSIS — Z79899 Other long term (current) drug therapy: Secondary | ICD-10-CM | POA: Insufficient documentation

## 2012-09-19 DIAGNOSIS — M549 Dorsalgia, unspecified: Secondary | ICD-10-CM

## 2012-09-19 DIAGNOSIS — F411 Generalized anxiety disorder: Secondary | ICD-10-CM | POA: Insufficient documentation

## 2012-09-19 DIAGNOSIS — F3289 Other specified depressive episodes: Secondary | ICD-10-CM | POA: Insufficient documentation

## 2012-09-19 DIAGNOSIS — Z8719 Personal history of other diseases of the digestive system: Secondary | ICD-10-CM | POA: Insufficient documentation

## 2012-09-19 DIAGNOSIS — F319 Bipolar disorder, unspecified: Secondary | ICD-10-CM | POA: Insufficient documentation

## 2012-09-19 DIAGNOSIS — M25562 Pain in left knee: Secondary | ICD-10-CM

## 2012-09-19 DIAGNOSIS — I10 Essential (primary) hypertension: Secondary | ICD-10-CM

## 2012-09-19 DIAGNOSIS — F329 Major depressive disorder, single episode, unspecified: Secondary | ICD-10-CM | POA: Insufficient documentation

## 2012-09-19 DIAGNOSIS — E785 Hyperlipidemia, unspecified: Secondary | ICD-10-CM | POA: Insufficient documentation

## 2012-09-19 DIAGNOSIS — F1021 Alcohol dependence, in remission: Secondary | ICD-10-CM | POA: Insufficient documentation

## 2012-09-19 DIAGNOSIS — M25569 Pain in unspecified knee: Secondary | ICD-10-CM | POA: Insufficient documentation

## 2012-09-19 DIAGNOSIS — F419 Anxiety disorder, unspecified: Secondary | ICD-10-CM

## 2012-09-19 LAB — RAPID URINE DRUG SCREEN, HOSP PERFORMED
Amphetamines: POSITIVE — AB
Barbiturates: NOT DETECTED
Benzodiazepines: POSITIVE — AB
Cocaine: NOT DETECTED

## 2012-09-19 LAB — BASIC METABOLIC PANEL
BUN: 18 mg/dL (ref 6–23)
CO2: 25 mEq/L (ref 19–32)
Chloride: 96 mEq/L (ref 96–112)
Creatinine, Ser: 1.43 mg/dL — ABNORMAL HIGH (ref 0.50–1.10)
Potassium: 3.9 mEq/L (ref 3.5–5.1)

## 2012-09-19 LAB — CBC
HCT: 35.2 % — ABNORMAL LOW (ref 36.0–46.0)
Hemoglobin: 11.7 g/dL — ABNORMAL LOW (ref 12.0–15.0)
MCV: 84.8 fL (ref 78.0–100.0)
RBC: 4.15 MIL/uL (ref 3.87–5.11)
WBC: 7.4 10*3/uL (ref 4.0–10.5)

## 2012-09-19 LAB — URINALYSIS, ROUTINE W REFLEX MICROSCOPIC
Glucose, UA: NEGATIVE mg/dL
Ketones, ur: NEGATIVE mg/dL
Specific Gravity, Urine: 1.025 (ref 1.005–1.030)
pH: 5 (ref 5.0–8.0)

## 2012-09-19 LAB — URINE MICROSCOPIC-ADD ON

## 2012-09-19 MED ORDER — NAPROXEN 500 MG PO TABS: 500.0000 mg | ORAL_TABLET | Freq: Two times a day (BID) | ORAL | Status: DC

## 2012-09-19 MED ORDER — CYCLOBENZAPRINE HCL 10 MG PO TABS: 10.0000 mg | ORAL_TABLET | Freq: Two times a day (BID) | ORAL | Status: DC | PRN

## 2012-09-19 MED ORDER — OXYCODONE-ACETAMINOPHEN 5-325 MG PO TABS
1.0000 | ORAL_TABLET | Freq: Once | ORAL | Status: AC
  Administered 2012-09-19: 1 via ORAL
  Filled 2012-09-19: qty 1

## 2012-09-19 MED ORDER — CEPHALEXIN 500 MG PO CAPS: 500.0000 mg | ORAL_CAPSULE | Freq: Four times a day (QID) | ORAL | Status: DC

## 2012-09-19 MED ORDER — SODIUM CHLORIDE 0.9 % IV BOLUS (SEPSIS)
1000.0000 mL | Freq: Once | INTRAVENOUS | Status: AC
  Administered 2012-09-19: 1000 mL via INTRAVENOUS

## 2012-09-19 NOTE — ED Notes (Signed)
Bed:WA03<BR> Expected date:<BR> Expected time:<BR> Means of arrival:<BR> Comments:<BR>

## 2012-09-19 NOTE — ED Notes (Signed)
Pt reports recurrent low back, bilateral leg, and knee pain. Pt stated that she has been referred to pain clinic by multiple practitioners

## 2012-09-19 NOTE — ED Notes (Signed)
PA advised of persistent  low BP. Pt retriaged to 3

## 2012-09-19 NOTE — ED Provider Notes (Signed)
History     CSN: [id number]  Arrival date & time 09/19/12  1215   First MD Initiated Contact with Patient 09/19/12 1308      Chief Complaint  Patient presents with  . Back Pain    pt reports persistant low back and leg pain  . Leg Pain  . Knee Pain    (Consider location/radiation/quality/duration/timing/severity/associated sxs/prior treatment) The history is provided by the patient. No language interpreter was used.  Lynn Palmer is a 52 y/o F with PMHx of DM, HTN, HLD, depression, anxiety, seizure presenting to the ED with low back pain and bilateral knee pain - recurrent discomfort that has been ongoing for many years. Stated that the back pain and the knee pain started approximately 3 days ago - lower back pain is described as a constant aching sensation without radiation, leg pain is described as a constant stabbing sensation from the knees down. Patient reported that the pain is worse when she is walking or applying pressure. Patient reported that she normally takes Tramadol for the discomfort - prescribed by PCP - stated that this is not helping. Reported that she goes to the [facility] to perform stretching and strengthening techniques - stated that this helps her back feel better. Stated that she was seen in [facility] ED for back pain episode last month - CT performed and she was diagnosed with degenerative disc disease, as per patient. Stated that she gets episodes of back and leg pain - stated that these symptoms are the same and how she normally presents. Denied fall, injury, numbness, tingling, urinary incontinence, bowel incontinence. Patient reported that she has been referred to many pain specialists regarding reoccurrence of back and leg pain - stated that she has not followed up.     PCP: Dr. Fannie Knee  Past Medical History  Diagnosis Date  . Bipolar 1 disorder   . Obesity   . Diabetes mellitus   . Hyperlipidemia   . Hypertension   . Vomiting   . Depression   .  Anxiety   . Substance abuse     alcohol   . Seizures     as a teenager    Past Surgical History  Procedure Laterality Date  . Tonsillectomy    . Laparoscopic gastric banding  06/11/09  . Vaginal delivery  1987    Family History  Problem Relation Age of Onset  . Osteoarthritis Mother   . Heart failure Father   . Osteoarthritis Father     History  Substance Use Topics  . Smoking status: Former Games developer  . Smokeless tobacco: Never Used  . Alcohol Use: 0.0 oz/week     Comment: social    OB History   Grav Para Term Preterm Abortions TAB SAB Ect Mult Living                  Review of Systems  Constitutional: Negative for fever, chills and fatigue.  HENT: Negative for sore throat, trouble swallowing, neck pain and neck stiffness.   Eyes: Negative for photophobia, pain and visual disturbance.  Respiratory: Negative for cough, chest tightness and shortness of breath.   Cardiovascular: Negative for chest pain.  Gastrointestinal: Negative for nausea, vomiting, abdominal pain, diarrhea and constipation.  Genitourinary: Negative for decreased urine volume and difficulty urinating.  Musculoskeletal: Positive for back pain and arthralgias.  Neurological: Negative for dizziness, weakness, light-headedness and headaches.  All other systems reviewed and are negative.    Allergies  Review of patient's allergies  indicates no known allergies.  Home Medications   Current Outpatient Rx  Name  Route  Sig  Dispense  Refill  . acetaminophen (TYLENOL) 325 MG tablet   Oral   Take 2 tablets (650 mg total) by mouth every 6 (six) hours as needed for pain (PAIN/HEADACHE).         Marland Kitchen amLODipine (NORVASC) 5 MG tablet   Oral   Take 1 tablet (5 mg total) by mouth every morning. For hypertension         . divalproex (DEPAKOTE ER) 500 MG 24 hr tablet      Take 1 tablet (500 mg) in am and 2 tablets (1000 mg) at bedtime: For mood control   90 tablet   0   . gabapentin (NEURONTIN) 100 MG  capsule      Take 100 mg twice daily and 200 mg at bedtime: For anxiety/pain control   120 capsule   0   . metFORMIN (GLUCOPHAGE) 500 MG tablet   Oral   Take 1 tablet (500 mg total) by mouth 2 (two) times daily. For diabetes control         . metoprolol (LOPRESSOR) 50 MG tablet   Oral   Take 1 tablet (50 mg total) by mouth 2 (two) times daily. For control of hypertension         . Multiple Vitamin (MULTIVITAMIN WITH MINERALS) TABS   Oral   Take 1 tablet by mouth every morning. For low vitamin         . norethindrone-ethinyl estradiol (NORTREL 7/7/7) 0.5/0.75/1-35 MG-MCG tablet   Oral   Take 1 tablet by mouth daily. Birth control method   1 Package      . pravastatin (PRAVACHOL) 20 MG tablet   Oral   Take 1 tablet (20 mg total) by mouth every evening. For control of high cholesterol         . QUEtiapine (SEROQUEL) 200 MG tablet   Oral   Take 200 mg by mouth at bedtime.         . traMADol (ULTRAM) 50 MG tablet   Oral   Take 50 mg by mouth at bedtime.         . traZODone (DESYREL) 100 MG tablet   Oral   Take 1 tablet (100 mg total) by mouth at bedtime. For depression/sleep   30 tablet   0   . cephALEXin (KEFLEX) 500 MG capsule   Oral   Take 1 capsule (500 mg total) by mouth 4 (four) times daily.   20 capsule   0   . cyclobenzaprine (FLEXERIL) 10 MG tablet   Oral   Take 1 tablet (10 mg total) by mouth 2 (two) times daily as needed for muscle spasms.   20 tablet   0   . naproxen (NAPROSYN) 500 MG tablet   Oral   Take 1 tablet (500 mg total) by mouth 2 (two) times daily.   30 tablet   0     BP 135/63  Pulse 83  Temp(Src) 98.1 F (36.7 C) (Oral)  Resp 16  Wt 183 lb (83.008 kg)  BMI 34.6 kg/m2  SpO2 99%  LMP 08/19/2012  Physical Exam  Nursing note and vitals reviewed. Constitutional: She is oriented to person, place, and time. She appears well-developed and well-nourished. No distress.  HENT:  Head: Normocephalic and atraumatic.  Eyes:  Conjunctivae and EOM are normal. Pupils are equal, round, and reactive to light. Right eye exhibits no discharge. Left  eye exhibits no discharge.  Neck: Normal range of motion. Neck supple.  Negative neck stiffness Negative nuchal rigidity   Cardiovascular: Normal rate, regular rhythm and normal heart sounds.  Exam reveals no friction rub.   No murmur heard. Pulses:      Radial pulses are 2+ on the right side, and 2+ on the left side.       Dorsalis pedis pulses are 2+ on the right side, and 2+ on the left side.  Pulmonary/Chest: Effort normal and breath sounds normal. No respiratory distress. She has no wheezes. She has no rales.  Musculoskeletal: Normal range of motion. She exhibits tenderness. She exhibits no edema.  Mild swelling noted to the knees, bilaterally - negative erythema, inflammation, warmth noted   Discomfort to palpation of the knees bilaterally, circumferential discomfort  Full ROM to upper extremities, bilaterally Decreased flexion of knees, bilaterally, secondary to pain  Strength 5+/5+ to upper and lower extremities, bilaterally.   Neurological: She is alert and oriented to person, place, and time. No cranial nerve deficit. She exhibits normal muscle tone. Coordination normal.  Sensation intact to upper and lower extremities, bilaterally, with differentiation of sharp and dull touch  Skin: Skin is warm and dry. No rash noted. She is not diaphoretic. No erythema.  Psychiatric: She has a normal mood and affect. Her behavior is normal. Thought content normal.    ED Course  Procedures (including critical care time)   IV NS fluids given due to decreased blood pressure - vitals every 30 minutes ordered for monitoring blood pressure   Medications  sodium chloride 0.9 % bolus 1,000 mL (0 mLs Intravenous Stopped 09/19/12 1600)  oxyCODONE-acetaminophen (PERCOCET/ROXICET) 5-325 MG per tablet 1 tablet (1 tablet Oral Given 09/19/12 1603)    Labs Reviewed  CBC - Abnormal;  Notable for the following:    Hemoglobin 11.7 (*)    HCT 35.2 (*)    All other components within normal limits  BASIC METABOLIC PANEL - Abnormal; Notable for the following:    Sodium 131 (*)    Glucose, Bld 132 (*)    Creatinine, Ser 1.43 (*)    GFR calc non Af Amer 42 (*)    GFR calc Af Amer 48 (*)    All other components within normal limits  URINALYSIS, ROUTINE W REFLEX MICROSCOPIC - Abnormal; Notable for the following:    Color, Urine AMBER (*)    APPearance CLOUDY (*)    Hgb urine dipstick TRACE (*)    Bilirubin Urine SMALL (*)    Leukocytes, UA MODERATE (*)    All other components within normal limits  URINE RAPID DRUG SCREEN (HOSP PERFORMED) - Abnormal; Notable for the following:    Benzodiazepines POSITIVE (*)    Amphetamines POSITIVE (*)    Tetrahydrocannabinol POSITIVE (*)    All other components within normal limits  URINE MICROSCOPIC-ADD ON - Abnormal; Notable for the following:    Squamous Epithelial / LPF FEW (*)    Bacteria, UA MANY (*)    All other components within normal limits   Dg Knee Complete 4 Views Left  09/19/2012   *RADIOLOGY REPORT*  Clinical Data: Left knee pain.  LEFT KNEE - COMPLETE 4+ VIEW  Comparison: None.  Findings: Moderately advanced tricompartmental degenerative disease of the left knee present with the most significant disease involving the medial compartment and patellofemoral joint.  No associated fracture, subluxation or joint effusion identified.  No bony lesions are seen.  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   Original Report Authenticated By: Irish Lack, M.D.   Dg Knee Complete 4 Views Right  09/19/2012   *RADIOLOGY REPORT*  Clinical Data: Bilateral knee pain, no known injury  RIGHT KNEE - COMPLETE 4+ VIEW  Comparison: 02/17/2011  Findings: No fracture or dislocation is seen.  Moderate to severe tricompartmental degenerative changes, most prominent in the patellofemoral compartment.  Moderate  suprapatellar knee joint effusion.  IMPRESSION: Moderate to severe tricompartmental degenerative changes, most prominent in the patellofemoral compartment.  Moderate suprapatellar knee joint effusion.   Original Report Authenticated By: Charline Bills, M.D.     1. Back pain   2. Knee pain, bilateral   3. DM (diabetes mellitus)   4. HTN (hypertension)   5. Depression   6. Anxiety   7. UTI (urinary tract infection)       MDM  Blood pressure decreased - patient placed on IV fluids Lumbar spine xray performed on 06/2012 - L5-S1 degenerative changes noted - no new injury or fall - no imaging warranted at this time.  Hgb and Hct lowered - Hgb (11.7) and Hct (35.2). UA elevated WBC, many bacteria noted to urine. Creatinine elevated (1.43) with elevated GFR - early renal disordered. Positive benzo, amphetamines, cannabis on urine. Knee imaging, bilaterally, noted arthritis in the knees, joint effusion noted to the right knee. Patient's pain controlled in ED setting. Discussed findings with patient - recommended patient to follow-up with PCP to get blood re-checked for GFR and Cr elevation for possible renal disorder beginnings. Discussed with patient to get Hgb and Hct re-checked with PCP. Referred patient to orthopedics regarding back pain and knee discomfort. Discussed with patient that it will best for her to find a pain specialist to control her pain. Suspected back pain secondary to degenerative changes, leg pain from knee down bilaterally secondary to arthritic changes. Patient stable, afebrile. Discharged patient with anti-inflammatories and muscle relaxer. Antibiotics given for UTI. Blood pressure controlled in ED setting. Discussed with patient to continue to rest and stay hydrated, discussed no strenuous activity. Discussed with patient to continue to monitor symptoms and if symptoms are to worsen or change to report back to the ED - strict return instructions given. Patient agreed to plan of  care, understood, all questions answered.              Raymon Mutton, PA-C 09/19/12 2132

## 2012-09-21 NOTE — ED Provider Notes (Signed)
Medical screening examination/treatment/procedure(s) were performed by non-physician practitioner and as supervising physician I was immediately available for consultation/collaboration.  Derwood Kaplan, MD 09/21/12 1530

## 2012-09-24 ENCOUNTER — Emergency Department (HOSPITAL_COMMUNITY)
Admission: EM | Admit: 2012-09-24 | Discharge: 2012-09-24 | Disposition: A | Discharge status: Other Acute Inpatient Hospital | Payer: Medicare Other | Attending: Emergency Medicine | Admitting: Emergency Medicine

## 2012-09-24 ENCOUNTER — Encounter (HOSPITAL_COMMUNITY): Payer: Self-pay | Admitting: *Deleted

## 2012-09-24 ENCOUNTER — Inpatient Hospital Stay (HOSPITAL_COMMUNITY)
Admission: AD | Admit: 2012-09-24 | Discharge: 2012-09-28 | DRG: 885 | Disposition: A | Discharge status: Home / Self Care | Payer: 59 | Source: Intra-hospital | Attending: Psychiatry | Admitting: Psychiatry

## 2012-09-24 ENCOUNTER — Encounter (HOSPITAL_COMMUNITY): Payer: Self-pay

## 2012-09-24 DIAGNOSIS — Z8719 Personal history of other diseases of the digestive system: Secondary | ICD-10-CM | POA: Insufficient documentation

## 2012-09-24 DIAGNOSIS — R45851 Suicidal ideations: Secondary | ICD-10-CM

## 2012-09-24 DIAGNOSIS — Z79899 Other long term (current) drug therapy: Secondary | ICD-10-CM

## 2012-09-24 DIAGNOSIS — F329 Major depressive disorder, single episode, unspecified: Secondary | ICD-10-CM

## 2012-09-24 DIAGNOSIS — F32A Depression, unspecified: Secondary | ICD-10-CM

## 2012-09-24 DIAGNOSIS — E119 Type 2 diabetes mellitus without complications: Secondary | ICD-10-CM | POA: Insufficient documentation

## 2012-09-24 DIAGNOSIS — E669 Obesity, unspecified: Secondary | ICD-10-CM | POA: Insufficient documentation

## 2012-09-24 DIAGNOSIS — Z87891 Personal history of nicotine dependence: Secondary | ICD-10-CM | POA: Insufficient documentation

## 2012-09-24 DIAGNOSIS — Z3202 Encounter for pregnancy test, result negative: Secondary | ICD-10-CM | POA: Insufficient documentation

## 2012-09-24 DIAGNOSIS — IMO0002 Reserved for concepts with insufficient information to code with codable children: Secondary | ICD-10-CM | POA: Insufficient documentation

## 2012-09-24 DIAGNOSIS — E785 Hyperlipidemia, unspecified: Secondary | ICD-10-CM | POA: Diagnosis present

## 2012-09-24 DIAGNOSIS — F1120 Opioid dependence, uncomplicated: Secondary | ICD-10-CM

## 2012-09-24 DIAGNOSIS — G40909 Epilepsy, unspecified, not intractable, without status epilepticus: Secondary | ICD-10-CM | POA: Insufficient documentation

## 2012-09-24 DIAGNOSIS — I10 Essential (primary) hypertension: Secondary | ICD-10-CM | POA: Diagnosis present

## 2012-09-24 DIAGNOSIS — G479 Sleep disorder, unspecified: Secondary | ICD-10-CM | POA: Insufficient documentation

## 2012-09-24 DIAGNOSIS — F411 Generalized anxiety disorder: Secondary | ICD-10-CM | POA: Diagnosis present

## 2012-09-24 DIAGNOSIS — F1020 Alcohol dependence, uncomplicated: Secondary | ICD-10-CM

## 2012-09-24 DIAGNOSIS — F332 Major depressive disorder, recurrent severe without psychotic features: Principal | ICD-10-CM | POA: Diagnosis present

## 2012-09-24 DIAGNOSIS — F319 Bipolar disorder, unspecified: Secondary | ICD-10-CM | POA: Insufficient documentation

## 2012-09-24 LAB — RAPID URINE DRUG SCREEN, HOSP PERFORMED
Amphetamines: NOT DETECTED
Cocaine: NOT DETECTED
Opiates: NOT DETECTED
Tetrahydrocannabinol: NOT DETECTED

## 2012-09-24 LAB — COMPREHENSIVE METABOLIC PANEL
ALT: 17 U/L (ref 0–35)
AST: 32 U/L (ref 0–37)
CO2: 22 mEq/L (ref 19–32)
Calcium: 8.9 mg/dL (ref 8.4–10.5)
GFR calc non Af Amer: 58 mL/min — ABNORMAL LOW (ref >90)
Sodium: 134 mEq/L — ABNORMAL LOW (ref 135–145)

## 2012-09-24 LAB — ACETAMINOPHEN LEVEL: Acetaminophen (Tylenol), Serum: <15 ug/mL (ref 10–30)

## 2012-09-24 LAB — GLUCOSE, CAPILLARY: Glucose-Capillary: 119 mg/dL — ABNORMAL HIGH (ref 70–99)

## 2012-09-24 LAB — CBC
MCH: 28.4 pg (ref 26.0–34.0)
Platelets: 218 10*3/uL (ref 150–400)
RBC: 4.26 MIL/uL (ref 3.87–5.11)
WBC: 7 10*3/uL (ref 4.0–10.5)

## 2012-09-24 MED ORDER — VITAMIN B-1 100 MG PO TABS
100.0000 mg | ORAL_TABLET | Freq: Every day | ORAL | Status: DC
  Administered 2012-09-25 – 2012-09-28 (×3): 100 mg via ORAL
  Filled 2012-09-24 (×6): qty 1

## 2012-09-24 MED ORDER — METOPROLOL TARTRATE 25 MG PO TABS
50.0000 mg | ORAL_TABLET | Freq: Two times a day (BID) | ORAL | Status: DC
  Administered 2012-09-24 (×2): 50 mg via ORAL
  Filled 2012-09-24: qty 2
  Filled 2012-09-24 (×2): qty 1

## 2012-09-24 MED ORDER — ACETAMINOPHEN 325 MG PO TABS
650.0000 mg | ORAL_TABLET | ORAL | Status: DC | PRN
  Administered 2012-09-24: 650 mg via ORAL
  Filled 2012-09-24: qty 2

## 2012-09-24 MED ORDER — CEPHALEXIN 500 MG PO CAPS
500.0000 mg | ORAL_CAPSULE | Freq: Four times a day (QID) | ORAL | Status: DC
  Administered 2012-09-24 (×2): 500 mg via ORAL
  Filled 2012-09-24 (×2): qty 1

## 2012-09-24 MED ORDER — CHLORDIAZEPOXIDE HCL 25 MG PO CAPS
25.0000 mg | ORAL_CAPSULE | ORAL | Status: AC
  Administered 2012-09-27 – 2012-09-28 (×2): 25 mg via ORAL
  Filled 2012-09-24 (×2): qty 1

## 2012-09-24 MED ORDER — CHLORDIAZEPOXIDE HCL 25 MG PO CAPS
25.0000 mg | ORAL_CAPSULE | Freq: Four times a day (QID) | ORAL | Status: AC
  Administered 2012-09-24 – 2012-09-26 (×6): 25 mg via ORAL
  Filled 2012-09-24 (×6): qty 1

## 2012-09-24 MED ORDER — DIVALPROEX SODIUM ER 500 MG PO TB24
500.0000 mg | ORAL_TABLET | Freq: Every day | ORAL | Status: DC
  Administered 2012-09-25 – 2012-09-28 (×4): 500 mg via ORAL
  Filled 2012-09-24 (×5): qty 1
  Filled 2012-09-24: qty 42

## 2012-09-24 MED ORDER — CHLORDIAZEPOXIDE HCL 25 MG PO CAPS
25.0000 mg | ORAL_CAPSULE | Freq: Four times a day (QID) | ORAL | Status: AC | PRN
  Administered 2012-09-26: 25 mg via ORAL
  Filled 2012-09-24: qty 1

## 2012-09-24 MED ORDER — GABAPENTIN 100 MG PO CAPS
100.0000 mg | ORAL_CAPSULE | Freq: Two times a day (BID) | ORAL | Status: DC
  Administered 2012-09-25 – 2012-09-28 (×7): 100 mg via ORAL
  Filled 2012-09-24 (×5): qty 1
  Filled 2012-09-24: qty 56
  Filled 2012-09-24 (×4): qty 1
  Filled 2012-09-24: qty 56
  Filled 2012-09-24 (×2): qty 1

## 2012-09-24 MED ORDER — ADULT MULTIVITAMIN W/MINERALS CH
1.0000 | ORAL_TABLET | Freq: Every day | ORAL | Status: DC
  Administered 2012-09-25 – 2012-09-28 (×4): 1 via ORAL
  Filled 2012-09-24 (×7): qty 1

## 2012-09-24 MED ORDER — THIAMINE HCL 100 MG/ML IJ SOLN: 100.0000 mg | Freq: Every day | INTRAMUSCULAR | Status: DC

## 2012-09-24 MED ORDER — METFORMIN HCL 500 MG PO TABS
500.0000 mg | ORAL_TABLET | Freq: Two times a day (BID) | ORAL | Status: DC
  Administered 2012-09-24: 500 mg via ORAL
  Filled 2012-09-24 (×3): qty 1

## 2012-09-24 MED ORDER — HYDROXYZINE HCL 50 MG PO TABS
50.0000 mg | ORAL_TABLET | Freq: Three times a day (TID) | ORAL | Status: DC | PRN
  Administered 2012-09-26 – 2012-09-27 (×3): 50 mg via ORAL
  Filled 2012-09-24: qty 30

## 2012-09-24 MED ORDER — QUETIAPINE FUMARATE 300 MG PO TABS
400.0000 mg | ORAL_TABLET | Freq: Every day | ORAL | Status: DC
  Administered 2012-09-24: 400 mg via ORAL
  Filled 2012-09-24: qty 1

## 2012-09-24 MED ORDER — THIAMINE HCL 100 MG/ML IJ SOLN: 100.0000 mg | Freq: Once | INTRAMUSCULAR | Status: DC

## 2012-09-24 MED ORDER — METOPROLOL TARTRATE 50 MG PO TABS
50.0000 mg | ORAL_TABLET | Freq: Two times a day (BID) | ORAL | Status: DC
  Administered 2012-09-25 – 2012-09-28 (×7): 50 mg via ORAL
  Filled 2012-09-24: qty 1
  Filled 2012-09-24: qty 28
  Filled 2012-09-24 (×8): qty 1
  Filled 2012-09-24: qty 28
  Filled 2012-09-24: qty 1

## 2012-09-24 MED ORDER — DIVALPROEX SODIUM ER 500 MG PO TB24
500.0000 mg | ORAL_TABLET | Freq: Every day | ORAL | Status: DC
  Administered 2012-09-24: 500 mg via ORAL
  Filled 2012-09-24: qty 1

## 2012-09-24 MED ORDER — GABAPENTIN 100 MG PO CAPS
100.0000 mg | ORAL_CAPSULE | Freq: Two times a day (BID) | ORAL | Status: DC
  Administered 2012-09-24: 100 mg via ORAL
  Filled 2012-09-24 (×3): qty 1

## 2012-09-24 MED ORDER — AMLODIPINE BESYLATE 5 MG PO TABS
5.0000 mg | ORAL_TABLET | Freq: Every morning | ORAL | Status: DC
  Administered 2012-09-25 – 2012-09-28 (×4): 5 mg via ORAL
  Filled 2012-09-24: qty 14
  Filled 2012-09-24 (×5): qty 1

## 2012-09-24 MED ORDER — IBUPROFEN 600 MG PO TABS
600.0000 mg | ORAL_TABLET | Freq: Three times a day (TID) | ORAL | Status: DC | PRN
  Administered 2012-09-24: 600 mg via ORAL
  Filled 2012-09-24: qty 1

## 2012-09-24 MED ORDER — FOLIC ACID 1 MG PO TABS
1.0000 mg | ORAL_TABLET | Freq: Every day | ORAL | Status: DC
  Administered 2012-09-24: 1 mg via ORAL
  Filled 2012-09-24: qty 1

## 2012-09-24 MED ORDER — HYDROXYZINE HCL 25 MG PO TABS
50.0000 mg | ORAL_TABLET | Freq: Three times a day (TID) | ORAL | Status: DC | PRN
  Administered 2012-09-24: 50 mg via ORAL
  Filled 2012-09-24: qty 2

## 2012-09-24 MED ORDER — ADULT MULTIVITAMIN W/MINERALS CH
1.0000 | ORAL_TABLET | Freq: Every day | ORAL | Status: DC
  Administered 2012-09-24: 1 via ORAL
  Filled 2012-09-24 (×4): qty 1

## 2012-09-24 MED ORDER — MAGNESIUM HYDROXIDE 400 MG/5ML PO SUSP: 30.0000 mL | Freq: Every day | ORAL | Status: DC | PRN

## 2012-09-24 MED ORDER — ONDANSETRON 4 MG PO TBDP: 4.0000 mg | ORAL_TABLET | Freq: Four times a day (QID) | ORAL | Status: AC | PRN

## 2012-09-24 MED ORDER — LOPERAMIDE HCL 2 MG PO CAPS
2.0000 mg | ORAL_CAPSULE | ORAL | Status: DC | PRN
  Administered 2012-09-27: 4 mg via ORAL

## 2012-09-24 MED ORDER — SIMVASTATIN 10 MG PO TABS
10.0000 mg | ORAL_TABLET | Freq: Every day | ORAL | Status: DC
  Administered 2012-09-25 – 2012-09-27 (×3): 10 mg via ORAL
  Filled 2012-09-24 (×4): qty 1
  Filled 2012-09-24: qty 14
  Filled 2012-09-24: qty 1

## 2012-09-24 MED ORDER — VITAMIN B-1 100 MG PO TABS
100.0000 mg | ORAL_TABLET | Freq: Every day | ORAL | Status: DC
  Administered 2012-09-24: 100 mg via ORAL
  Filled 2012-09-24: qty 1

## 2012-09-24 MED ORDER — TRAZODONE HCL 100 MG PO TABS
100.0000 mg | ORAL_TABLET | Freq: Every day | ORAL | Status: DC
  Administered 2012-09-24 – 2012-09-27 (×4): 100 mg via ORAL
  Filled 2012-09-24 (×6): qty 1
  Filled 2012-09-24: qty 14

## 2012-09-24 MED ORDER — CHLORDIAZEPOXIDE HCL 25 MG PO CAPS: 25.0000 mg | ORAL_CAPSULE | Freq: Every day | ORAL | Status: DC

## 2012-09-24 MED ORDER — QUETIAPINE FUMARATE 400 MG PO TABS
400.0000 mg | ORAL_TABLET | Freq: Every day | ORAL | Status: DC
  Administered 2012-09-24 – 2012-09-27 (×4): 400 mg via ORAL
  Filled 2012-09-24: qty 14
  Filled 2012-09-24 (×6): qty 1

## 2012-09-24 MED ORDER — FOLIC ACID 1 MG PO TABS
1.0000 mg | ORAL_TABLET | Freq: Every day | ORAL | Status: DC
  Administered 2012-09-25 – 2012-09-28 (×4): 1 mg via ORAL
  Filled 2012-09-24 (×4): qty 1
  Filled 2012-09-24: qty 14
  Filled 2012-09-24: qty 1

## 2012-09-24 MED ORDER — TRAZODONE HCL 100 MG PO TABS: 100.0000 mg | ORAL_TABLET | Freq: Every day | ORAL | Status: DC

## 2012-09-24 MED ORDER — ADULT MULTIVITAMIN W/MINERALS CH
1.0000 | ORAL_TABLET | Freq: Every day | ORAL | Status: DC
  Administered 2012-09-24: 1 via ORAL
  Filled 2012-09-24: qty 1

## 2012-09-24 MED ORDER — METFORMIN HCL 500 MG PO TABS
500.0000 mg | ORAL_TABLET | Freq: Two times a day (BID) | ORAL | Status: DC
  Administered 2012-09-25 – 2012-09-28 (×7): 500 mg via ORAL
  Filled 2012-09-24 (×6): qty 1
  Filled 2012-09-24: qty 28
  Filled 2012-09-24: qty 1
  Filled 2012-09-24: qty 28
  Filled 2012-09-24 (×2): qty 1

## 2012-09-24 MED ORDER — CEPHALEXIN 500 MG PO CAPS
500.0000 mg | ORAL_CAPSULE | Freq: Four times a day (QID) | ORAL | Status: DC
  Administered 2012-09-24 – 2012-09-28 (×15): 500 mg via ORAL
  Filled 2012-09-24 (×2): qty 1
  Filled 2012-09-24: qty 16
  Filled 2012-09-24: qty 1
  Filled 2012-09-24: qty 16
  Filled 2012-09-24 (×10): qty 1
  Filled 2012-09-24: qty 16
  Filled 2012-09-24 (×3): qty 1
  Filled 2012-09-24: qty 16
  Filled 2012-09-24 (×4): qty 1

## 2012-09-24 MED ORDER — HYDROXYZINE HCL 25 MG PO TABS
25.0000 mg | ORAL_TABLET | Freq: Four times a day (QID) | ORAL | Status: DC | PRN
  Administered 2012-09-25 – 2012-09-26 (×3): 25 mg via ORAL

## 2012-09-24 MED ORDER — LORAZEPAM 1 MG PO TABS
1.0000 mg | ORAL_TABLET | Freq: Three times a day (TID) | ORAL | Status: DC | PRN
  Administered 2012-09-25 – 2012-09-27 (×6): 1 mg via ORAL
  Filled 2012-09-24 (×6): qty 1

## 2012-09-24 MED ORDER — AMLODIPINE BESYLATE 5 MG PO TABS
5.0000 mg | ORAL_TABLET | Freq: Every morning | ORAL | Status: DC
  Administered 2012-09-24: 5 mg via ORAL
  Filled 2012-09-24: qty 1

## 2012-09-24 MED ORDER — IBUPROFEN 600 MG PO TABS
600.0000 mg | ORAL_TABLET | Freq: Three times a day (TID) | ORAL | Status: DC | PRN
  Administered 2012-09-24 – 2012-09-27 (×5): 600 mg via ORAL
  Filled 2012-09-24 (×5): qty 1

## 2012-09-24 MED ORDER — ZOLPIDEM TARTRATE 5 MG PO TABS: 5.0000 mg | ORAL_TABLET | Freq: Every evening | ORAL | Status: DC | PRN

## 2012-09-24 MED ORDER — SIMVASTATIN 10 MG PO TABS
10.0000 mg | ORAL_TABLET | Freq: Every day | ORAL | Status: DC
  Filled 2012-09-24: qty 1

## 2012-09-24 MED ORDER — ONDANSETRON HCL 4 MG PO TABS
4.0000 mg | ORAL_TABLET | Freq: Three times a day (TID) | ORAL | Status: DC | PRN
  Administered 2012-09-24: 4 mg via ORAL
  Filled 2012-09-24: qty 1

## 2012-09-24 MED ORDER — LORAZEPAM 1 MG PO TABS
1.0000 mg | ORAL_TABLET | Freq: Three times a day (TID) | ORAL | Status: DC | PRN
Start: 2012-09-24 — End: 2012-09-24

## 2012-09-24 MED ORDER — VITAMIN B-1 100 MG PO TABS
100.0000 mg | ORAL_TABLET | Freq: Every day | ORAL | Status: DC
  Filled 2012-09-24 (×2): qty 1

## 2012-09-24 MED ORDER — ALUM & MAG HYDROXIDE-SIMETH 200-200-20 MG/5ML PO SUSP: 30.0000 mL | ORAL | Status: DC | PRN

## 2012-09-24 MED ORDER — CHLORDIAZEPOXIDE HCL 25 MG PO CAPS
25.0000 mg | ORAL_CAPSULE | Freq: Three times a day (TID) | ORAL | Status: AC
  Administered 2012-09-26 – 2012-09-27 (×2): 25 mg via ORAL
  Filled 2012-09-24 (×3): qty 1
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
## 2012-09-24 NOTE — ED Notes (Signed)
BP taken manually 142/92, anxious and knee pain. medicated

## 2012-09-24 NOTE — ED Provider Notes (Signed)
Medical screening examination/treatment/procedure(s) were performed by non-physician practitioner and as supervising physician I was immediately available for consultation/collaboration.  Amish Mintzer M Shacarra Choe, MD 09/24/12 0531 
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
## 2012-09-24 NOTE — ED Notes (Signed)
Report called to Casimiro Needle RN and security called to transport to [facility]
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
## 2012-09-24 NOTE — [facility] Counselor (Signed)
Patient accepted to [facility] by Julieanne Cotton, NP to Dr. Elsie Saas. The room assignment is 501-2. Bobby-ACT counselor made aware of patient's acceptance and patient must come after 3pm, per [facility].

## 2012-09-24 NOTE — ED Notes (Signed)
C/o nausea and vomiting, Zofran given. Patient has lap band and stomach is very small early a.m.
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
## 2012-09-25 DIAGNOSIS — F319 Bipolar disorder, unspecified: Secondary | ICD-10-CM

## 2012-09-25 DIAGNOSIS — F1020 Alcohol dependence, uncomplicated: Secondary | ICD-10-CM
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
## 2012-09-25 NOTE — Progress Notes (Signed)
Psychoeducational Group Note  Psychoeducational Group Note  Date: 09/25/2012 Time:  09/25/2012  Group Topic/Focus:  Gratefulness:  The focus of this group is to help patients identify what two things they are most grateful for in their lives. What helps ground them and to center them on their work to their recovery.  Participation Level:  Active  Participation Quality:  Appropriate  Affect:  Appropriate  Cognitive:  Appropriate  Insight:  Lacking  Engagement in Group:  Engaged  Additional Comments:   Charnell Peplinski A  

## 2012-09-25 NOTE — Progress Notes (Signed)
Patient ID: Lynn Palmer, female   DOB: 1960-11-24, 52 y.o.   MRN: [id number] D. The patient is pleasant and social, interacting in the milieu. Observed watching TV, talking, laughing with others. When alone with staff gets a sad look on her face and reports she is depressed and feeling hopeless.  A. Encouraged to attend evening group. Assessed for suicide. Reviewed and administered medications. R. The patient attended and actively participated in evening group. Contracts for safety. Compliant with medication.

## 2012-09-25 NOTE — Progress Notes (Signed)
D. Pt has been up and has been active and has been attending and participating in various milieu activities. Pt spoke of her substance abuse and how she wants to get into a treatment program after discharge. Pt has spoken at length about her relationship with her boyfriend and feels that if they both get clean and sober that the relationship can work and said the only time they have problems is when they are both using. Pt has complained of anxiety and back pain throughout the day today and received various PRN medications to help. Pt also spoke about working on her coping skills. A. Support and encouragement provided, medication education completed. R. Pt verbalized understanding, will continue to monitor.
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
## 2012-09-25 NOTE — Progress Notes (Signed)
Psychoeducational Group Note  Date:  09/25/2012 Time:  1015  Group Topic/Focus:  Making Healthy Choices:   The focus of this group is to help patients identify negative/unhealthy choices they were using prior to admission and identify positive/healthier coping strategies to replace them upon discharge.  Participation Level:  Active  Participation Quality:  Appropriate  Affect:  Depressed  Cognitive:  Alert  Insight:  Improving  Engagement in Group:  Improving  Additional Comments:    Tarnisha Kachmar A 09/25/2012 

## 2012-09-25 NOTE — [facility] Group Notes (Signed)
[facility] LCSW Group Therapy  09/25/2012  10:00  Type of Therapy:  Group Therapy  Participation Level:  Active  Participation Quality:  Attentive  Affect:  Appropriate  Cognitive:  Oriented  Insight:  Limited  Engagement in Therapy:  Engaged  Modes of Intervention:  Discussion, Exploration and Socialization  Summary of Progress/Problems:   The main focus of today's process group was to identify the patient's current support system and decide on other supports that can be put in place. Four definitions/levels of support were discussed and an exercise was utilized to show how much stronger we become with additional supports. An emphasis was placed on using counselor, doctor, therapy groups, 12-step groups, and problem-specific support groups to expand supports, as well as doing something different than has been done before.   Lynn Palmer stated that she has no supports, but placed the blame on others rather than taking any responsibility for the situation herself.  She then tried to engage the group several times on telling them sordid details about her life that were irrelevant to the conversation.  She was shut down each time either by the leader or other group members.  Rod Dorrington, LCSW 09/25/2012 3:00 PM

## 2012-09-26 LAB — HEMOGLOBIN A1C
Hgb A1c MFr Bld: 5.8 % — ABNORMAL HIGH (ref <5.7)
Mean Plasma Glucose: 120 mg/dL — ABNORMAL HIGH (ref <117)

## 2012-09-26 LAB — LIPID PANEL: LDL Cholesterol: 68 mg/dL (ref 0–99)

## 2012-09-26 LAB — VALPROIC ACID LEVEL: Valproic Acid Lvl: 15.7 ug/mL — ABNORMAL LOW (ref 50.0–100.0)

## 2012-09-26 MED ORDER — DIVALPROEX SODIUM ER 500 MG PO TB24
1000.0000 mg | ORAL_TABLET | Freq: Every day | ORAL | Status: DC
  Administered 2012-09-26 – 2012-09-27 (×2): 1000 mg via ORAL
  Filled 2012-09-26 (×4): qty 2

## 2012-09-26 MED ORDER — INSULIN ASPART 100 UNIT/ML ~~LOC~~ SOLN
0.0000 [IU] | Freq: Three times a day (TID) | SUBCUTANEOUS | Status: DC
  Administered 2012-09-27: 1 [IU] via SUBCUTANEOUS

## 2012-09-26 MED ORDER — INSULIN ASPART 100 UNIT/ML ~~LOC~~ SOLN: 0.0000 [IU] | Freq: Every day | SUBCUTANEOUS | Status: DC
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
## 2012-09-26 NOTE — [facility] Group Notes (Signed)
[facility] LCSW Aftercare Discharge Planning Group Note   09/26/2012 12:05 PM  Participation Quality:  Appropriate  Mood/Affect:  Appropriate  Depression Rating:  10  Anxiety Rating:  10  Thoughts of Suicide:  Yes  Will you contract for safety?   Yes  Current AVH:  No  Plan for Discharge/Comments:  Patient is endorsing off/on SI but able to contract for safety.  She is rating symptoms at ten.  Patient is requesting residential treatment at discharge but does not want to go to [facility]. She reports being discharged from [facility] before completing treatment due to a disagreement.  She is requesting services at [facility].     Transportation Means:   Patient has transportation.   Supports:  Patient has a good support system.   Taneil Lazarus, Joesph July

## 2012-09-26 NOTE — Progress Notes (Signed)
Adult Psychoeducational Group Note  Date:  09/26/2012 Time:  3:10 AM  Group Topic/Focus:  Wrap-Up Group:   The focus of this group is to help patients review their daily goal of treatment and discuss progress on daily workbooks.  Participation Level:  Active  Participation Quality:  Appropriate  Affect:  Appropriate  Cognitive:  Appropriate  Insight: Good  Engagement in Group:  Engaged  Modes of Intervention:  Support  Additional Comments:  Patient attended and participated in group tonight. She reports that she was depress today.  She missed her father on this Father's Day. Her father died on her birthday. She was moody today and slept most of the day. She advised that upon discharge she would like to be referred to a more extended stay facility.  Lita Mains [facility] 09/26/2012, 3:10 AM
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
## 2012-09-26 NOTE — Tx Team (Signed)
Interdisciplinary Treatment Plan Update   Date Reviewed:  09/26/2012  Time Reviewed:  9:51 AM  Progress in Treatment:   Attending groups: Yes Participating in groups: Yes Taking medication as prescribed: Yes  Tolerating medication: Yes Family/Significant other contact made: No, but will ask patient for consent for collateral contact Patient understands diagnosis: Yes  Discussing patient identified problems/goals with staff: Yes Medical problems stabilized or resolved: Yes Denies suicidal/homicidal ideation: Yes Patient has not harmed self or others: Yes  For review of initial/current patient goals, please see plan of care.  Estimated Length of Stay:  3-5 days  Reasons for Continued Hospitalization:  Anxiety Depression Medication stabilization Suicidal ideation  New Problems/Goals identified:    Discharge Plan or Barriers:  Patient is homeless  Additional Comments:  Patient is endorsing off/on SI but able to contract for safety.  She is rating symptoms at ten.  Patient is requesting residential treatment at discharge but does not want to go to [facility].    Attendees:  Patient:  09/26/2012 9:51 AM   Signature: Mervyn Gay, MD 09/26/2012 9:51 AM  Signature: 09/26/2012 9:51 AM  Signature:  09/26/2012 9:51 AM  Signature:Beverly Terrilee Croak, RN 09/26/2012 9:51 AM  Signature:  Neill Loft RN 09/26/2012 9:51 AM  Signature:  Juline Patch, LCSW 09/26/2012 9:51 AM  Signature:   09/26/2012 9:51 AM  Signature:  Maseta Dorley,Care Coordinator 09/26/2012 9:51 AM  Signature: Fransisca Kaufmann, [facility] 09/26/2012 9:51 AM  Signature:    Signature:    Signature:      Scribe for Treatment Team:   Juline Patch,  09/26/2012 9:51 AM
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
## 2012-09-26 NOTE — Progress Notes (Signed)
Psychoeducational Group Note  Date:  09/26/2012 Time:1100  Group Topic/Focus:  Self Care:   The focus of this group is to help patients understand the importance of self-care in order to improve or restore emotional, physical, spiritual, interpersonal, and financial health.  Participation Level: Did Not Attend  Participation Quality:  Not Applicable  Affect:  Not Applicable  Cognitive:  Not Applicable  Insight:  Not Applicable  Engagement in Group: Not Applicable  Additional Comments:  Patient did not attend group, patient remained in bed.  Karleen Hampshire Brittini 09/26/2012, 3:14 PM

## 2012-09-26 NOTE — Progress Notes (Signed)
D.  Pt. Has been in bed most of the evening reporting that she is tired.  Denies SI/HI and denies A/V hallucinations.  Compliant with medications. A  Pt. Encouraged to attend groups.  Safety checks maintained. R.  Pt. Remains safe.

## 2012-09-26 NOTE — [facility] Group Notes (Signed)
[facility] LCSW Group Therapy          Overcoming Obstacles       1:15 -2:30        09/26/2012   12:10 PM     Type of Therapy:  Group Therapy  Participation Level: Patient entered group as it was ending.  Wynn Banker 09/26/2012    12:10 PM

## 2012-09-27 DIAGNOSIS — F3160 Bipolar disorder, current episode mixed, unspecified: Secondary | ICD-10-CM

## 2012-09-27 DIAGNOSIS — F1994 Other psychoactive substance use, unspecified with psychoactive substance-induced mood disorder: Secondary | ICD-10-CM

## 2012-09-27 DIAGNOSIS — F1010 Alcohol abuse, uncomplicated: Secondary | ICD-10-CM

## 2012-09-27 LAB — GLUCOSE, CAPILLARY
Glucose-Capillary: 122 mg/dL — ABNORMAL HIGH (ref 70–99)
Glucose-Capillary: 129 mg/dL — ABNORMAL HIGH (ref 70–99)
Glucose-Capillary: 113 mg/dL — ABNORMAL HIGH (ref 70–99)

## 2012-09-27 MED ORDER — LOPERAMIDE HCL 2 MG PO CAPS
2.0000 mg | ORAL_CAPSULE | ORAL | Status: DC | PRN
  Administered 2012-09-27: 2 mg via ORAL
  Administered 2012-09-27: 4 mg via ORAL
  Administered 2012-09-28: 2 mg via ORAL
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
## 2012-09-27 NOTE — [facility] Group Notes (Signed)
[facility] LCSW Aftercare Discharge Planning Group Note   09/27/2012 10:32 AM  Participation Quality:  Appropriate  Mood/Affect:  Appropriate  Depression Rating:  7  Anxiety Rating: 7  Thoughts of Suicide: No Will you contract for safety?   N/A Current AVH:  No  Plan for Discharge/Comments:  Patient reports feeling a little better today.  She shared she is interested in treatment and would prefer [facility] but will also consider [facility].  Patient stated she is praying God will direct her to the right place for treatment as she does not want to return to the home she was living in prior to admission.  Transportation Means:   Patient uses public transportation.   Supports:  Patient has limited support system. Kelvin Cellar, Joesph July

## 2012-09-27 NOTE — Progress Notes (Signed)
D Pt continues to be irritable & anxious. CIWA= 3. Pt c/o anxiety & diarrhea.A: Pt. Was medicated for diarrhea & anxiety.Continues on 15 minute checks. Relief noted. Pt safety maintained.

## 2012-09-27 NOTE — Progress Notes (Addendum)
Recreation Therapy Notes  Date: 06.17.2014 Time: 2:45pm Location: [address] Dayroom      Group Topic/Focus: Animal Assist Activities/Therapy (AAA/T)  Participation Level: Did not attend.   Ruhani Umland L Graesyn Schreifels, LRT/CTRS  Anyela Napierkowski L 09/27/2012 4:27 PM 

## 2012-09-27 NOTE — [facility] Group Notes (Signed)
[facility] LCSW Group Therapy      Feelings About Diagnosis 1:15 - 2:30 PM         09/27/2012  2:49 PM    Type of Therapy:  Group Therapy  Participation Level:  Did not attend group  Wynn Banker 09/27/2012  2:49 PM

## 2012-09-27 NOTE — Progress Notes (Signed)
Patient ID: Lynn Palmer, female   DOB: 10-10-1960, 52 y.o.   MRN: [id number] D- Patient reports fair sleep and continuous pain from back.  Her appetite is good and her energy level is low.  Her ability to pay attention is poor.  She reports feeling very anxious, and craving She is a fall risk and fall precautions reviewed with hre. She continues to focus on what will happen if she cannot get into a treatment center at d/c .Marland Kitchen She continues to say she knows she will relapse if she is discharged homeless.  She rates her depression and her hopelessness at 10.  A- Talked with patient about mindfulness and staying in the moment and taking advantage of her time in treatment here.  R- Patient agrees she will try.

## 2012-09-27 NOTE — Progress Notes (Signed)
Adult Psychoeducational Group Note  Date:  09/27/2012 Time:  12:42 PM  Group Topic/Focus:  Recovery Goals:   The focus of this group is to identify appropriate goals for recovery and establish a plan to achieve them.  Participation Level:  Did Not Attend  Participation Quality:  Did not Attend  Affect:  Did not Attend  Cognitive:  Did not Attend  Insight: None  Engagement in Group:  Did not Attend.  Modes of Intervention:  Did not Attend  Additional Comments:    Cathlean Cower 09/27/2012, 12:42 PM

## 2012-09-27 NOTE — Progress Notes (Signed)
Patient ID: Lynn Palmer, female   DOB: [birth year], 52 y.o.   MRN: [id number] D: Pt. In bed eyes closed, resp. Even. A: Pt. Will be monitored q108min for safety. R: Pt. Is safe on the unit. No distress noted.

## 2012-09-28 DIAGNOSIS — F332 Major depressive disorder, recurrent severe without psychotic features: Principal | ICD-10-CM

## 2012-09-28 DIAGNOSIS — F1910 Other psychoactive substance abuse, uncomplicated: Secondary | ICD-10-CM

## 2012-09-28 LAB — GLUCOSE, CAPILLARY
Glucose-Capillary: 120 mg/dL — ABNORMAL HIGH (ref 70–99)
Glucose-Capillary: 102 mg/dL — ABNORMAL HIGH (ref 70–99)

## 2012-09-28 MED ORDER — PRAVASTATIN SODIUM 20 MG PO TABS: 20.0000 mg | ORAL_TABLET | Freq: Every evening | ORAL | Status: DC

## 2012-09-28 MED ORDER — CEPHALEXIN 500 MG PO CAPS: 500.0000 mg | ORAL_CAPSULE | Freq: Four times a day (QID) | ORAL | Status: DC

## 2012-09-28 MED ORDER — CYCLOBENZAPRINE HCL 10 MG PO TABS: 10.0000 mg | ORAL_TABLET | Freq: Two times a day (BID) | ORAL | Status: DC | PRN

## 2012-09-28 MED ORDER — TRAMADOL HCL 50 MG PO TABS: 50.0000 mg | ORAL_TABLET | Freq: Three times a day (TID) | ORAL | Status: DC | PRN

## 2012-09-28 MED ORDER — FOLIC ACID 1 MG PO TABS: 1.0000 mg | ORAL_TABLET | Freq: Every day | ORAL | Status: DC

## 2012-09-28 MED ORDER — HYDROXYZINE HCL 50 MG PO TABS: 50.0000 mg | ORAL_TABLET | Freq: Three times a day (TID) | ORAL | Status: DC | PRN

## 2012-09-28 MED ORDER — DIVALPROEX SODIUM ER 500 MG PO TB24: ORAL_TABLET | ORAL | Status: DC

## 2012-09-28 MED ORDER — TRAZODONE HCL 100 MG PO TABS: 100.0000 mg | ORAL_TABLET | Freq: Every day | ORAL | Status: DC

## 2012-09-28 MED ORDER — GABAPENTIN 100 MG PO CAPS: ORAL_CAPSULE | ORAL | Status: DC

## 2012-09-28 MED ORDER — AMLODIPINE BESYLATE 5 MG PO TABS: 5.0000 mg | ORAL_TABLET | Freq: Every morning | ORAL | Status: DC

## 2012-09-28 MED ORDER — QUETIAPINE FUMARATE 200 MG PO TABS: 400.0000 mg | ORAL_TABLET | Freq: Every day | ORAL | Status: DC

## 2012-09-28 NOTE — Progress Notes (Signed)
Patient in bed resting with eyes closed at the beginning of this shift. She came to the medication window shortly after that to complain of diarrhea. She reported that she had constipation earlier and drank some prune juice and had been going to bathroom since morning. She reported that she received 1 cap of imodium earlier and that didn't help and she had 5 stool after that. Writer administered Imodium 4 mg. She also requested for Vistaril for anxiety. Writer encouraged and supported patient. Q 15 minute check continues as ordered to maintain safety.

## 2012-09-28 NOTE — [facility] Group Notes (Signed)
[facility] LCSW Aftercare Discharge Planning Group Note   09/28/2012 11:49 AM  Participation Quality:  Appropriate  Mood/Affect:  Appropriate  Depression Rating: 3  Anxiety Rating:3  Thoughts of Suicide: No  Will you contract for safety?   N/A  Current AVH:  No  Plan for Discharge/Comments:  Patient reports being much better today and hopes to get a bed at [facility].  Writer to make contact with [facility] as requested by patient.  Transportation Means:   Patient uses public transportation.   Supports:  Patient has limited support system. Kelvin Cellar, Joesph July
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
## 2012-09-28 NOTE — Progress Notes (Signed)
[facility] Adult Case Management Discharge Plan :  Will you be returning to the same living situation after discharge: No.  Patient discharging to [facility] for residential treatment At discharge, do you have transportation home?:Yes,  [facility] will transport patient to their facility Do you have the ability to pay for your medications:Yes,  patient can afford medications but will be assisted with a two week supply of medication for residential treatment  Release of information consent forms completed and in the chart;  Patient's signature needed at discharge.  Patient to Follow up at: Follow-up Information   Follow up with [facility] On 09/28/2012. (Today at 1:00 PM)    Contact information:   [address] [place], [place]   [address]  [phone]        Patient denies SI/HI:   Patient no longer endorsing SI/HI or other thoughts of self harm.     Safety Planning and Suicide Prevention discussed:  .Reviewed with all patients during discharge planning group  Virgle Arth, Joesph July 09/28/2012, 11:39 AM

## 2012-09-28 NOTE — Progress Notes (Signed)
Discharge Note: Discharge instructions/prescriptions/medication samples given to patient. Patient verbalized understanding of discharge instructions and prescriptions. Returned belongings to patient. Denies SI/HI/AVH. Patient d/c without incident to the lobby and transported by a representative from [facility].

## 2012-09-28 NOTE — Progress Notes (Signed)
Adult Psychoeducational Group Note  Date:  09/28/2012 Time:  12:12 PM  Group Topic/Focus:  Personal Choices and Values:   The focus of this group is to help patients assess and explore the importance of values in their lives, how their values affect their decisions, how they express their values and what opposes their expression.  Participation Level:  Minimal  Participation Quality:  Attentive  Affect:  Flat and Irritable  Cognitive:  Alert and Appropriate  Insight: Lacking  Engagement in Group:  Lacking and Limited  Modes of Intervention:  Activity, Discussion, Socialization and Support  Additional Comments:  Patient came to group for a few minutes and did participate in group. The purpose of this group was for the patient to identify what four values they found most important to them and how to incorporate them into their lives. The patient identified a change or a choice for how they can support that particular value in their life. The patient shared a value with the group and an example of a choice to take responsibility for that value. There was an emphasis on choices and how choices impact and support values.   Cathlean Cower 09/28/2012, 12:12 PM

## 2012-09-28 NOTE — [facility] Suicide Risk Assessment (Signed)
[facility] INPATIENT:  Family/Significant Other Suicide Prevention Education  Suicide Prevention Education:   Patient discharged to [facility]. Patient Discharged to Other Healthcare Facility:  Suicide Prevention Education Not Provided: {PT. DISCHARGED TO OTHER HEALTHCARE FACILITY:SUICIDE PREVENTION EDUCATION NOT PROVIDED (CHL):  The patient is discharging to another healthcare facility for continuation of treatment.  The patient's medical information, including suicide ideations and risk factors, are a part of the medical information shared with the receiving healthcare facility.  Wynn Banker 09/28/2012, 11:47 AM
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
## 2012-10-03 NOTE — Progress Notes (Signed)
Patient Discharge Instructions:  After Visit Summary (AVS):   Faxed to:  10/03/12 Discharge Summary Note:   Faxed to:  10/03/12 Psychiatric Admission Assessment Note:   Faxed to:  10/03/12 Suicide Risk Assessment - Discharge Assessment:   Faxed to:  10/03/12 Faxed/Sent to the Next Level Care provider:  10/03/12 Faxed to [facility] @ [phone]  Jerelene Redden, 10/03/2012, 4:28 PM

## 2012-10-17 ENCOUNTER — Emergency Department (HOSPITAL_COMMUNITY)
Admission: EM | Admit: 2012-10-17 | Discharge: 2012-10-17 | Disposition: A | Discharge status: Home / Self Care | Payer: Medicare Other | Attending: Emergency Medicine | Admitting: Emergency Medicine

## 2012-10-17 ENCOUNTER — Encounter (HOSPITAL_COMMUNITY): Payer: Self-pay | Admitting: *Deleted

## 2012-10-17 DIAGNOSIS — Z862 Personal history of diseases of the blood and blood-forming organs and certain disorders involving the immune mechanism: Secondary | ICD-10-CM | POA: Insufficient documentation

## 2012-10-17 DIAGNOSIS — E669 Obesity, unspecified: Secondary | ICD-10-CM | POA: Insufficient documentation

## 2012-10-17 DIAGNOSIS — I10 Essential (primary) hypertension: Secondary | ICD-10-CM | POA: Insufficient documentation

## 2012-10-17 DIAGNOSIS — E119 Type 2 diabetes mellitus without complications: Secondary | ICD-10-CM | POA: Insufficient documentation

## 2012-10-17 DIAGNOSIS — F419 Anxiety disorder, unspecified: Secondary | ICD-10-CM

## 2012-10-17 DIAGNOSIS — Z79899 Other long term (current) drug therapy: Secondary | ICD-10-CM | POA: Insufficient documentation

## 2012-10-17 DIAGNOSIS — F411 Generalized anxiety disorder: Secondary | ICD-10-CM | POA: Insufficient documentation

## 2012-10-17 DIAGNOSIS — F319 Bipolar disorder, unspecified: Secondary | ICD-10-CM | POA: Insufficient documentation

## 2012-10-17 DIAGNOSIS — Z760 Encounter for issue of repeat prescription: Secondary | ICD-10-CM | POA: Insufficient documentation

## 2012-10-17 DIAGNOSIS — Z8639 Personal history of other endocrine, nutritional and metabolic disease: Secondary | ICD-10-CM | POA: Insufficient documentation

## 2012-10-17 DIAGNOSIS — Z87891 Personal history of nicotine dependence: Secondary | ICD-10-CM | POA: Insufficient documentation

## 2012-10-17 MED ORDER — METFORMIN HCL 500 MG PO TABS: 500.0000 mg | ORAL_TABLET | Freq: Two times a day (BID) | ORAL | Status: DC

## 2012-10-17 MED ORDER — AMLODIPINE BESYLATE 5 MG PO TABS: 5.0000 mg | ORAL_TABLET | Freq: Every morning | ORAL | Status: DC

## 2012-10-17 MED ORDER — DIVALPROEX SODIUM ER 500 MG PO TB24: ORAL_TABLET | ORAL | Status: DC

## 2012-10-17 MED ORDER — PRAVASTATIN SODIUM 20 MG PO TABS: 20.0000 mg | ORAL_TABLET | Freq: Every evening | ORAL | Status: DC

## 2012-10-17 MED ORDER — TRAZODONE HCL 100 MG PO TABS: 100.0000 mg | ORAL_TABLET | Freq: Every day | ORAL | Status: DC

## 2012-10-17 MED ORDER — QUETIAPINE FUMARATE 200 MG PO TABS: 400.0000 mg | ORAL_TABLET | Freq: Every day | ORAL | Status: DC

## 2012-10-17 MED ORDER — HYDROXYZINE HCL 50 MG PO TABS: 50.0000 mg | ORAL_TABLET | Freq: Three times a day (TID) | ORAL | Status: DC | PRN

## 2012-10-17 MED ORDER — METOPROLOL TARTRATE 50 MG PO TABS: 50.0000 mg | ORAL_TABLET | Freq: Two times a day (BID) | ORAL | Status: DC

## 2012-10-17 MED ORDER — GABAPENTIN 100 MG PO CAPS: ORAL_CAPSULE | ORAL | Status: DC

## 2012-10-17 MED ORDER — LORAZEPAM 1 MG PO TABS
2.0000 mg | ORAL_TABLET | Freq: Once | ORAL | Status: AC
  Administered 2012-10-17: 2 mg via ORAL
  Filled 2012-10-17: qty 2

## 2012-10-17 NOTE — ED Notes (Signed)
Pt reports he was discharged from [facility] x 7 days ago, reports her pharmacy picked up her meds this am and was informed that they cannot fill her rxs until Wednesday.  Pt reports that she's freaking out because she's been without her meds for 7 days.  Pt is shaky.  Pt reports she needs rxs for her HTN, high cholesterol, bipolar and pain med.  Pt states that it's more trouble going to her PMD because she would have to take the bus, and go up a steep hill to see her PMD.

## 2012-10-17 NOTE — ED Provider Notes (Signed)
History    CSN: [id number] Arrival date & time 10/17/12  1407  First MD Initiated Contact with Patient 10/17/12 1429     Chief Complaint  Patient presents with  . Medication Refill    HPI Patient reports she was discharged from [facility] 7 days ago after being there for alcohol rehabilitation.  She's been without alcohol for 22 days now and seems to be doing well.  She reports she's having issues getting her medications from the pharmacy and this is making her extremely anxious.  No homicidal or suicidal thoughts.  She's requesting refills of her medications and something for anxiety now.  No other complaints.   Past Medical History  Diagnosis Date  . Bipolar 1 disorder   . Obesity   . Diabetes mellitus   . Hyperlipidemia   . Hypertension   . Vomiting   . Depression   . Anxiety   . Substance abuse     alcohol    Past Surgical History  Procedure Laterality Date  . Tonsillectomy    . Laparoscopic gastric banding  06/11/09  . Vaginal delivery  1987   Family History  Problem Relation Age of Onset  . Osteoarthritis Mother   . Heart failure Father   . Osteoarthritis Father    History  Substance Use Topics  . Smoking status: Former Games developer  . Smokeless tobacco: Never Used  . Alcohol Use: 0.0 oz/week     Comment: social   OB History   Grav Para Term Preterm Abortions TAB SAB Ect Mult Living                 Review of Systems  All other systems reviewed and are negative.    Allergies  Review of patient's allergies indicates no known allergies.  Home Medications   Current Outpatient Rx  Name  Route  Sig  Dispense  Refill  . amLODipine (NORVASC) 5 MG tablet   Oral   Take 1 tablet (5 mg total) by mouth every morning. For hypertension   30 tablet   0   . cephALEXin (KEFLEX) 500 MG capsule   Oral   Take 1 capsule (500 mg total) by mouth 4 (four) times daily.   20 capsule   0   . divalproex (DEPAKOTE ER) 500 MG 24 hr tablet      Take 1 tablet (500 mg) in am and  2 tablets (1000 mg) at bedtime: For mood control   90 tablet   0   . folic acid (FOLVITE) 1 MG tablet   Oral   Take 1 tablet (1 mg total) by mouth daily.   30 tablet   0   . gabapentin (NEURONTIN) 100 MG capsule      Take 100 mg twice daily and 200 mg at bedtime: For anxiety/pain control   120 capsule   0   . hydrOXYzine (ATARAX/VISTARIL) 50 MG tablet   Oral   Take 1 tablet (50 mg total) by mouth every 8 (eight) hours as needed (FOR ANXIETY).   10 tablet   0   . metFORMIN (GLUCOPHAGE) 500 MG tablet   Oral   Take 1 tablet (500 mg total) by mouth 2 (two) times daily. For diabetes control   30 tablet   0   . metoprolol (LOPRESSOR) 50 MG tablet   Oral   Take 1 tablet (50 mg total) by mouth 2 (two) times daily. For control of hypertension   60 tablet   0   .  norethindrone-ethinyl estradiol (NORTREL 7/7/7) 0.5/0.75/1-35 MG-MCG tablet   Oral   Take 1 tablet by mouth daily. Birth control method   1 Package      . pravastatin (PRAVACHOL) 20 MG tablet   Oral   Take 1 tablet (20 mg total) by mouth every evening. For control of high cholesterol   30 tablet   0   . QUEtiapine (SEROQUEL) 200 MG tablet   Oral   Take 2 tablets (400 mg total) by mouth at bedtime.   30 tablet   0   . traZODone (DESYREL) 100 MG tablet   Oral   Take 1 tablet (100 mg total) by mouth at bedtime. For depression/sleep   30 tablet   0    BP 166/106  Pulse 111  Temp(Src) 99.4 F (37.4 C) (Oral)  Resp 18  SpO2 99%  LMP 09/19/2012 Physical Exam  Nursing note and vitals reviewed. Constitutional: She is oriented to person, place, and time. She appears well-developed and well-nourished.  HENT:  Head: Normocephalic.  Eyes: EOM are normal.  Neck: Normal range of motion.  Pulmonary/Chest: Effort normal.  Abdominal: She exhibits no distension.  Musculoskeletal: Normal range of motion.  Neurological: She is alert and oriented to person, place, and time.  Psychiatric:  Anxious appearing no  homicidal or suicidal thoughts.  No delusions.  No mania    ED Course  Procedures (including critical care time) Labs Reviewed - No data to display No results found. No diagnosis found.  MDM  Medication refill performed.  Ativan 2 mg orally now.  The patient is not a threat to herself or others  Lyanne Co, MD 10/17/12 [phone]

## 2012-10-17 NOTE — ED Notes (Signed)
MD at bedside. 

## 2012-11-09 NOTE — Consult Note (Signed)
Agree with assessment and plan Chandra Asher A. Pritesh Sobecki, M.D. 

## 2012-11-11 ENCOUNTER — Emergency Department (HOSPITAL_COMMUNITY)
Admission: EM | Admit: 2012-11-11 | Discharge: 2012-11-11 | Disposition: A | Discharge status: Other Acute Inpatient Hospital | Payer: Medicare Other | Attending: Emergency Medicine | Admitting: Emergency Medicine

## 2012-11-11 ENCOUNTER — Inpatient Hospital Stay (HOSPITAL_COMMUNITY)
Admission: AD | Admit: 2012-11-11 | Discharge: 2012-11-17 | DRG: 885 | Disposition: A | Discharge status: Home / Self Care | Payer: 59 | Source: Intra-hospital | Attending: Psychiatry | Admitting: Psychiatry

## 2012-11-11 ENCOUNTER — Encounter (HOSPITAL_COMMUNITY): Payer: Self-pay | Admitting: *Deleted

## 2012-11-11 DIAGNOSIS — F411 Generalized anxiety disorder: Secondary | ICD-10-CM | POA: Diagnosis present

## 2012-11-11 DIAGNOSIS — E1121 Type 2 diabetes mellitus with diabetic nephropathy: Secondary | ICD-10-CM | POA: Insufficient documentation

## 2012-11-11 DIAGNOSIS — F319 Bipolar disorder, unspecified: Secondary | ICD-10-CM | POA: Diagnosis present

## 2012-11-11 DIAGNOSIS — R4182 Altered mental status, unspecified: Secondary | ICD-10-CM | POA: Insufficient documentation

## 2012-11-11 DIAGNOSIS — F1020 Alcohol dependence, uncomplicated: Secondary | ICD-10-CM | POA: Diagnosis present

## 2012-11-11 DIAGNOSIS — F1920 Other psychoactive substance dependence, uncomplicated: Secondary | ICD-10-CM | POA: Diagnosis present

## 2012-11-11 DIAGNOSIS — Z79899 Other long term (current) drug therapy: Secondary | ICD-10-CM | POA: Insufficient documentation

## 2012-11-11 DIAGNOSIS — Z87891 Personal history of nicotine dependence: Secondary | ICD-10-CM | POA: Insufficient documentation

## 2012-11-11 DIAGNOSIS — G8929 Other chronic pain: Secondary | ICD-10-CM | POA: Insufficient documentation

## 2012-11-11 DIAGNOSIS — I10 Essential (primary) hypertension: Secondary | ICD-10-CM | POA: Diagnosis present

## 2012-11-11 DIAGNOSIS — M25569 Pain in unspecified knee: Secondary | ICD-10-CM | POA: Insufficient documentation

## 2012-11-11 DIAGNOSIS — F1910 Other psychoactive substance abuse, uncomplicated: Secondary | ICD-10-CM | POA: Diagnosis present

## 2012-11-11 DIAGNOSIS — E669 Obesity, unspecified: Secondary | ICD-10-CM | POA: Insufficient documentation

## 2012-11-11 DIAGNOSIS — N189 Chronic kidney disease, unspecified: Secondary | ICD-10-CM | POA: Insufficient documentation

## 2012-11-11 DIAGNOSIS — E785 Hyperlipidemia, unspecified: Secondary | ICD-10-CM | POA: Insufficient documentation

## 2012-11-11 DIAGNOSIS — E119 Type 2 diabetes mellitus without complications: Secondary | ICD-10-CM | POA: Diagnosis present

## 2012-11-11 DIAGNOSIS — Z3202 Encounter for pregnancy test, result negative: Secondary | ICD-10-CM | POA: Insufficient documentation

## 2012-11-11 DIAGNOSIS — F3130 Bipolar disorder, current episode depressed, mild or moderate severity, unspecified: Principal | ICD-10-CM | POA: Diagnosis present

## 2012-11-11 DIAGNOSIS — IMO0001 Reserved for inherently not codable concepts without codable children: Secondary | ICD-10-CM | POA: Insufficient documentation

## 2012-11-11 DIAGNOSIS — D649 Anemia, unspecified: Secondary | ICD-10-CM | POA: Insufficient documentation

## 2012-11-11 LAB — CBC WITH DIFFERENTIAL/PLATELET
Eosinophils Relative: 0 % (ref 0–5)
HCT: 38.7 % (ref 36.0–46.0)
Hemoglobin: 12.9 g/dL (ref 12.0–15.0)
Lymphocytes Relative: 29 % (ref 12–46)
Lymphs Abs: 1.7 10*3/uL (ref 0.7–4.0)
MCH: 29.4 pg (ref 26.0–34.0)
MCV: 88.2 fL (ref 78.0–100.0)
Monocytes Relative: 7 % (ref 3–12)
Platelets: 278 10*3/uL (ref 150–400)
RBC: 4.39 MIL/uL (ref 3.87–5.11)
WBC: 5.8 10*3/uL (ref 4.0–10.5)

## 2012-11-11 LAB — COMPREHENSIVE METABOLIC PANEL
ALT: 17 U/L (ref 0–35)
Alkaline Phosphatase: 52 U/L (ref 39–117)
BUN: 16 mg/dL (ref 6–23)
CO2: 25 mEq/L (ref 19–32)
Calcium: 9 mg/dL (ref 8.4–10.5)
GFR calc Af Amer: 51 mL/min — ABNORMAL LOW (ref >90)
GFR calc non Af Amer: 44 mL/min — ABNORMAL LOW (ref >90)
Glucose, Bld: 146 mg/dL — ABNORMAL HIGH (ref 70–99)
Sodium: 136 mEq/L (ref 135–145)

## 2012-11-11 LAB — URINALYSIS, ROUTINE W REFLEX MICROSCOPIC
Glucose, UA: NEGATIVE mg/dL
Hgb urine dipstick: NEGATIVE
Ketones, ur: NEGATIVE mg/dL
Nitrite: NEGATIVE
Specific Gravity, Urine: 1.033 — ABNORMAL HIGH (ref 1.005–1.030)
pH: 5 (ref 5.0–8.0)

## 2012-11-11 LAB — ETHANOL: Alcohol, Ethyl (B): <11 mg/dL (ref 0–11)

## 2012-11-11 LAB — RAPID URINE DRUG SCREEN, HOSP PERFORMED
Barbiturates: NOT DETECTED
Cocaine: NOT DETECTED

## 2012-11-11 MED ORDER — ACETAMINOPHEN 325 MG PO TABS
650.0000 mg | ORAL_TABLET | Freq: Four times a day (QID) | ORAL | Status: DC | PRN
  Administered 2012-11-12 – 2012-11-17 (×10): 650 mg via ORAL

## 2012-11-11 MED ORDER — SIMVASTATIN 5 MG PO TABS
5.0000 mg | ORAL_TABLET | Freq: Every day | ORAL | Status: DC
  Filled 2012-11-11: qty 1

## 2012-11-11 MED ORDER — METOPROLOL TARTRATE 25 MG PO TABS
50.0000 mg | ORAL_TABLET | Freq: Two times a day (BID) | ORAL | Status: DC
  Administered 2012-11-11: 50 mg via ORAL
  Filled 2012-11-11: qty 2

## 2012-11-11 MED ORDER — AMLODIPINE BESYLATE 5 MG PO TABS
5.0000 mg | ORAL_TABLET | Freq: Every morning | ORAL | Status: DC
  Filled 2012-11-11: qty 1

## 2012-11-11 MED ORDER — GABAPENTIN 100 MG PO CAPS
200.0000 mg | ORAL_CAPSULE | Freq: Two times a day (BID) | ORAL | Status: DC
  Filled 2012-11-11: qty 2

## 2012-11-11 MED ORDER — GABAPENTIN 100 MG PO CAPS
200.0000 mg | ORAL_CAPSULE | Freq: Two times a day (BID) | ORAL | Status: DC
  Administered 2012-11-11 – 2012-11-17 (×12): 200 mg via ORAL
  Filled 2012-11-11 (×4): qty 2
  Filled 2012-11-11: qty 16
  Filled 2012-11-11: qty 2
  Filled 2012-11-11: qty 16
  Filled 2012-11-11 (×9): qty 2

## 2012-11-11 MED ORDER — QUETIAPINE FUMARATE 300 MG PO TABS: 400.0000 mg | ORAL_TABLET | Freq: Every day | ORAL | Status: DC

## 2012-11-11 MED ORDER — QUETIAPINE FUMARATE 400 MG PO TABS
400.0000 mg | ORAL_TABLET | Freq: Every day | ORAL | Status: DC
  Administered 2012-11-11 – 2012-11-16 (×6): 400 mg via ORAL
  Filled 2012-11-11 (×7): qty 1
  Filled 2012-11-11: qty 4
  Filled 2012-11-11: qty 1

## 2012-11-11 MED ORDER — TRAZODONE HCL 100 MG PO TABS
100.0000 mg | ORAL_TABLET | Freq: Every day | ORAL | Status: DC
  Administered 2012-11-11 – 2012-11-16 (×6): 100 mg via ORAL
  Filled 2012-11-11 (×6): qty 1
  Filled 2012-11-11: qty 4
  Filled 2012-11-11: qty 1

## 2012-11-11 MED ORDER — TRAZODONE HCL 100 MG PO TABS: 100.0000 mg | ORAL_TABLET | Freq: Every day | ORAL | Status: DC

## 2012-11-11 MED ORDER — DIVALPROEX SODIUM 500 MG PO DR TAB: 1000.0000 mg | DELAYED_RELEASE_TABLET | Freq: Every day | ORAL | Status: DC

## 2012-11-11 MED ORDER — ADULT MULTIVITAMIN W/MINERALS CH
1.0000 | ORAL_TABLET | Freq: Every day | ORAL | Status: DC
  Administered 2012-11-12 – 2012-11-17 (×6): 1 via ORAL
  Filled 2012-11-11 (×8): qty 1

## 2012-11-11 MED ORDER — MAGNESIUM HYDROXIDE 400 MG/5ML PO SUSP: 30.0000 mL | Freq: Every day | ORAL | Status: DC | PRN

## 2012-11-11 MED ORDER — VITAMIN B-1 100 MG PO TABS
100.0000 mg | ORAL_TABLET | Freq: Every day | ORAL | Status: DC
  Administered 2012-11-12 – 2012-11-17 (×7): 100 mg via ORAL
  Filled 2012-11-11 (×7): qty 1

## 2012-11-11 MED ORDER — ONDANSETRON 4 MG PO TBDP: 4.0000 mg | ORAL_TABLET | Freq: Four times a day (QID) | ORAL | Status: AC | PRN

## 2012-11-11 MED ORDER — CHLORDIAZEPOXIDE HCL 25 MG PO CAPS: 25.0000 mg | ORAL_CAPSULE | Freq: Four times a day (QID) | ORAL | Status: AC | PRN

## 2012-11-11 MED ORDER — HYDROXYZINE HCL 25 MG PO TABS: 50.0000 mg | ORAL_TABLET | Freq: Three times a day (TID) | ORAL | Status: DC | PRN

## 2012-11-11 MED ORDER — THIAMINE HCL 100 MG/ML IJ SOLN: 100.0000 mg | Freq: Once | INTRAMUSCULAR | Status: DC

## 2012-11-11 MED ORDER — DIVALPROEX SODIUM 500 MG PO DR TAB
500.0000 mg | DELAYED_RELEASE_TABLET | Freq: Every day | ORAL | Status: DC
  Filled 2012-11-11 (×2): qty 1

## 2012-11-11 MED ORDER — POTASSIUM CHLORIDE CRYS ER 20 MEQ PO TBCR
20.0000 meq | EXTENDED_RELEASE_TABLET | Freq: Once | ORAL | Status: AC
  Administered 2012-11-11: 20 meq via ORAL
  Filled 2012-11-11: qty 1

## 2012-11-11 MED ORDER — AMLODIPINE BESYLATE 5 MG PO TABS
5.0000 mg | ORAL_TABLET | Freq: Every day | ORAL | Status: DC
  Administered 2012-11-11 – 2012-11-17 (×7): 5 mg via ORAL
  Filled 2012-11-11: qty 4
  Filled 2012-11-11 (×8): qty 1

## 2012-11-11 MED ORDER — NORETHIN-ETH ESTRAD TRIPHASIC 0.5/0.75/1-35 MG-MCG PO TABS: 1.0000 | ORAL_TABLET | Freq: Every day | ORAL | Status: DC

## 2012-11-11 MED ORDER — METOPROLOL TARTRATE 50 MG PO TABS
50.0000 mg | ORAL_TABLET | Freq: Two times a day (BID) | ORAL | Status: DC
  Administered 2012-11-11 – 2012-11-17 (×12): 50 mg via ORAL
  Filled 2012-11-11 (×10): qty 1
  Filled 2012-11-11: qty 6
  Filled 2012-11-11 (×3): qty 1
  Filled 2012-11-11: qty 6
  Filled 2012-11-11: qty 1

## 2012-11-11 MED ORDER — DIVALPROEX SODIUM ER 500 MG PO TB24: 500.0000 mg | ORAL_TABLET | Freq: Two times a day (BID) | ORAL | Status: DC

## 2012-11-11 MED ORDER — METFORMIN HCL 500 MG PO TABS
500.0000 mg | ORAL_TABLET | Freq: Two times a day (BID) | ORAL | Status: DC
  Administered 2012-11-11 – 2012-11-17 (×12): 500 mg via ORAL
  Filled 2012-11-11 (×6): qty 1
  Filled 2012-11-11: qty 8
  Filled 2012-11-11 (×3): qty 1
  Filled 2012-11-11: qty 8
  Filled 2012-11-11 (×5): qty 1

## 2012-11-11 MED ORDER — ALUM & MAG HYDROXIDE-SIMETH 200-200-20 MG/5ML PO SUSP: 30.0000 mL | ORAL | Status: DC | PRN

## 2012-11-11 MED ORDER — DIVALPROEX SODIUM 500 MG PO DR TAB
1000.0000 mg | DELAYED_RELEASE_TABLET | Freq: Every day | ORAL | Status: DC
  Filled 2012-11-11 (×2): qty 2

## 2012-11-11 MED ORDER — METFORMIN HCL 500 MG PO TABS
500.0000 mg | ORAL_TABLET | Freq: Two times a day (BID) | ORAL | Status: DC
  Filled 2012-11-11 (×2): qty 1

## 2012-11-11 MED ORDER — DIVALPROEX SODIUM 500 MG PO DR TAB
500.0000 mg | DELAYED_RELEASE_TABLET | Freq: Every day | ORAL | Status: DC
  Administered 2012-11-11: 500 mg via ORAL
  Filled 2012-11-11: qty 1

## 2012-11-11 MED ORDER — SIMVASTATIN 10 MG PO TABS
5.0000 mg | ORAL_TABLET | Freq: Every day | ORAL | Status: DC
  Administered 2012-11-12 – 2012-11-16 (×5): 5 mg via ORAL
  Filled 2012-11-11 (×7): qty 0.5

## 2012-11-11 MED ORDER — HYDROXYZINE HCL 25 MG PO TABS: 25.0000 mg | ORAL_TABLET | Freq: Four times a day (QID) | ORAL | Status: DC | PRN

## 2012-11-11 MED ORDER — LOPERAMIDE HCL 2 MG PO CAPS: 2.0000 mg | ORAL_CAPSULE | ORAL | Status: AC | PRN
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
## 2012-11-11 NOTE — Progress Notes (Signed)
Notified per Nurse from [facility] emergency department at 1607 hrs of the critical  Lab value; Nurse Practitioner notified and order given and received; orders carried out; orders to hold all doses until next lab draw and provider will make the decision as needed; patient has no symptoms at this time; patient is in the room resting quietly; nurse will continue to monitor   Depakote level was 377 and I was notified at 1607 hrs 11/11/12; hold all Depakote and it has been discontinued per nurse practitioner orders until level drawn 11/12/12; Provider to give further instruction after lab value results

## 2012-11-11 NOTE — Progress Notes (Signed)
NUTRITION ASSESSMENT  Pt identified as at risk on the Malnutrition Screen Tool  INTERVENTION: 1. Educated patient on the importance of nutrition and encouraged intake of food and beverages. 2. Discussed weight goals. 3. Supplements: MVI  And thiamine daily 4,  Educated on the importance of adequate protein intake.  NUTRITION DIAGNOSIS: Unintentional weight loss related to sub-optimal intake as evidenced by pt report.   Goal: Pt to meet >/= 90% of their estimated nutrition needs.  Monitor:  PO intake  Assessment:  Patient admitted with ETOH abuse, hx of BP and DM.  Patient also reports a hx of lap band surgery and can only tolerate soft foods.  Overall was not eating well prior to admit secondary to ETOH abuse and poor appetite.  "Money runs out at the end of the month.  But I seem to always have enough money for ETOH."  Not enough money for food at the end of the month.  Goal weight is 150 lbs with continued weight loss but not done in a healthy way.  52 y.o. female  Height: Ht Readings from Last 1 Encounters:  11/11/12 5' (1.524 m)    Weight: Wt Readings from Last 1 Encounters:  11/11/12 171 lb (77.565 kg)    Weight Hx: Wt Readings from Last 10 Encounters:  11/11/12 171 lb (77.565 kg)  09/24/12 185 lb (83.915 kg)  09/24/12 195 lb 3.2 oz (88.542 kg)  09/19/12 183 lb (83.008 kg)  08/04/12 188 lb (85.276 kg)  05/09/12 189 lb 6.4 oz (85.911 kg)  03/16/12 187 lb 9.8 oz (85.1 kg)  01/04/12 182 lb (82.555 kg)  05/08/11 185 lb 6.4 oz (84.097 kg)  03/19/11 182 lb 2 oz (82.611 kg)    BMI:  Body mass index is 33.4 kg/(m^2). Pt meets criteria for obesity grade 1 based on current BMI.  Estimated Nutritional Needs: Kcal: 25-30 kcal/kg Protein: > 1 gram protein/kg Fluid: 1 ml/kcal  Diet Order: Carb Control Pt is also offered choice of unit snacks mid-morning and mid-afternoon.  Pt is eating as desired.   Lab results and medications reviewed.   Oran Rein, RD,  LDN Clinical Inpatient Dietitian Pager:  [phone] Weekend and after hours pager:  [phone]

## 2012-11-11 NOTE — ED Provider Notes (Signed)
  Medical screening examination/treatment/procedure(s) were performed by non-physician practitioner and as supervising physician I was immediately available for consultation/collaboration.   Darrell Hauk, MD 11/11/12 1521 

## 2012-11-11 NOTE — Progress Notes (Signed)
Pt accepted to [facility] 307-2 to Dr. Dub Mikes. CSW informed RN and NP. CSW faxed completed admission paperwork to [facility].   Catha Gosselin, LCSWA  [phone] .11/11/2012 1340pm

## 2012-11-11 NOTE — ED Notes (Signed)
Pt is denying SI/HI today but states "not today but i did yesterday and the day before."
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
## 2012-11-11 NOTE — Progress Notes (Signed)
Adult Psychoeducational Group Note  Date:  11/11/2012 Time:  10:31 PM  Group Topic/Focus:  AA group  Participation Level:  Active  Participation Quality:  Appropriate  Affect:  Appropriate  Cognitive:  Alert  Insight: Appropriate  Engagement in Group:  Engaged  Modes of Intervention:  Discussion  Additional Comments:    Flonnie Hailstone 11/11/2012, 10:31 PM
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
## 2012-11-11 NOTE — ED Notes (Signed)
Pt changed into blue scrubs items are at nurses station and items include a purple book bag with cell phone, red wallet,, pair of jean shorts, shoes and green shirt pt  has been wanded by security.

## 2012-11-11 NOTE — Tx Team (Signed)
Initial Interdisciplinary Treatment Plan  PATIENT STRENGTHS: (choose at least two) Average or above average intelligence Capable of independent living General fund of knowledge  PATIENT STRESSORS: Substance abuse   PROBLEM LIST: Problem List/Patient Goals Date to be addressed Date deferred Reason deferred Estimated date of resolution  Substance Abuse 11/11/2012     Depression 11/11/2012                                                DISCHARGE CRITERIA:  Improved stabilization in mood, thinking, and/or behavior Need for constant or close observation no longer present Withdrawal symptoms are absent or subacute and managed without 24-hour nursing intervention  PRELIMINARY DISCHARGE PLAN: Outpatient therapy Return to previous living arrangement  PATIENT/FAMIILY INVOLVEMENT: This treatment plan has been presented to and reviewed with the patient, Lynn Palmer.  The patient and family have been given the opportunity to ask questions and make suggestions.  Leighton Parody M 11/11/2012, 5:29 PM
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
## 2012-11-11 NOTE — Progress Notes (Signed)
CRITICAL VALUE ALERT  Critical value received:  Depakote 377  Date of notification:  11/11/12  Time of notification:  1607 hrs  Critical value read back: yes  Nurse who received alert:  Brayton El RN  MD notified (1st page):  Armandina Stammer   Time of first page:  1609  MD notified (2nd page):  Time of second page:  Responding MD:  Armandina Stammer  Time MD responded:  [phone]

## 2012-11-11 NOTE — Consult Note (Signed)
Patient discussed, needs inpatient detox.

## 2012-11-11 NOTE — Consult Note (Signed)
Reason for Consult: Alcohol detox Referring Physician: [name] is an 52 y.o. female.  HPI:  Female 55 was dropped off by her friend after drinking and blacking out on Wednesday.  He drank  too much alcohol, blacked out slept and woke up angry.  She then still intoxicated started breaking house hold belongings including TV and chairs.  Off note, she was admitted in our inpatient [facility] for detox from alcohol in June and was admitted at [facility] after our treatment.  After her rehab stay at [facility] she states she was sent to [facility] where she gradually started drinking with the men.  She states she drinks 3-4 40 oz beer daily and her last drink was was Wednesday and she also smoke a blunt of Marijuana every other day..  Patient reports over dosing on her Seroquel last week.  According to her she took 40 plus capsules of her Seroquel as a suicide gesture.  She wanted to sleep without waking up.  She states she did not want to wake up or call emergency services because she did not want help.  She is labile crying off and on during the interview.  She states she is disappointed at herself and has let her daughter and mother down.  She denies SI/HI/AVH at this time and is asking for another chance to detox.  She reports feeling hopeless and helpless and ashamed of her self.  We will admit her to our inpatient unit for detox.  Past Medical History  Diagnosis Date  . Bipolar 1 disorder   . Obesity   . Diabetes mellitus   . Hyperlipidemia   . Hypertension   . Vomiting   . Depression   . Anxiety   . Substance abuse     alcohol     Past Surgical History  Procedure Laterality Date  . Tonsillectomy    . Laparoscopic gastric banding  06/11/09  . Vaginal delivery  1987    Family History  Problem Relation Age of Onset  . Osteoarthritis Mother   . Heart failure Father   . Osteoarthritis Father     Social History:  reports that she has quit smoking. She has never used smokeless tobacco.  She reports that  drinks alcohol. She reports that she uses illicit drugs (Marijuana).  Allergies: No Known Allergies  Medications: I have reviewed the patient's current medications.  Results for orders placed during the hospital encounter of 11/11/12 (from the past 48 hour(s))  CBC WITH DIFFERENTIAL     Status: None   Collection Time    11/11/12 11:14 AM      Result Value Range   WBC 5.8  4.0 - 10.5 K/uL   RBC 4.39  3.87 - 5.11 MIL/uL   Hemoglobin 12.9  12.0 - 15.0 g/dL   HCT 16.1  09.6 - 04.5 %   MCV 88.2  78.0 - 100.0 fL   MCH 29.4  26.0 - 34.0 pg   MCHC 33.3  30.0 - 36.0 g/dL   RDW 40.9  81.1 - 91.4 %   Platelets 278  150 - 400 K/uL   Neutrophils Relative % 65  43 - 77 %   Neutro Abs 3.7  1.7 - 7.7 K/uL   Lymphocytes Relative 29  12 - 46 %   Lymphs Abs 1.7  0.7 - 4.0 K/uL   Monocytes Relative 7  3 - 12 %   Monocytes Absolute 0.4  0.1 - 1.0 K/uL  Eosinophils Relative 0  0 - 5 %   Eosinophils Absolute 0.0  0.0 - 0.7 K/uL   Basophils Relative 0  0 - 1 %   Basophils Absolute 0.0  0.0 - 0.1 K/uL  COMPREHENSIVE METABOLIC PANEL     Status: Abnormal   Collection Time    11/11/12 11:14 AM      Result Value Range   Sodium 136  135 - 145 mEq/L   Potassium 3.3 (*) 3.5 - 5.1 mEq/L   Chloride 97  96 - 112 mEq/L   CO2 25  19 - 32 mEq/L   Glucose, Bld 146 (*) 70 - 99 mg/dL   BUN 16  6 - 23 mg/dL   Creatinine, Ser 4.54 (*) 0.50 - 1.10 mg/dL   Calcium 9.0  8.4 - 09.8 mg/dL   Total Protein 7.1  6.0 - 8.3 g/dL   Albumin 3.1 (*) 3.5 - 5.2 g/dL   AST 22  0 - 37 U/L   ALT 17  0 - 35 U/L   Alkaline Phosphatase 52  39 - 117 U/L   Total Bilirubin 0.4  0.3 - 1.2 mg/dL   GFR calc non Af Amer 44 (*) >90 mL/min   GFR calc Af Amer 51 (*) >90 mL/min   Comment:            The eGFR has been calculated     using the CKD EPI equation.     This calculation has not been     validated in all clinical     situations.     eGFR's persistently     <90 mL/min signify     possible Chronic Kidney  Disease.  ETHANOL     Status: None   Collection Time    11/11/12 11:14 AM      Result Value Range   Alcohol, Ethyl (B) <11  0 - 11 mg/dL   Comment:            LOWEST DETECTABLE LIMIT FOR     SERUM ALCOHOL IS 11 mg/dL     FOR MEDICAL PURPOSES ONLY  URINALYSIS, ROUTINE W REFLEX MICROSCOPIC     Status: Abnormal   Collection Time    11/11/12 12:22 PM      Result Value Range   Color, Urine YELLOW  YELLOW   APPearance CLEAR  CLEAR   Specific Gravity, Urine 1.033 (*) 1.005 - 1.030   pH 5.0  5.0 - 8.0   Glucose, UA NEGATIVE  NEGATIVE mg/dL   Hgb urine dipstick NEGATIVE  NEGATIVE   Bilirubin Urine SMALL (*) NEGATIVE   Ketones, ur NEGATIVE  NEGATIVE mg/dL   Protein, ur NEGATIVE  NEGATIVE mg/dL   Urobilinogen, UA 1.0  0.0 - 1.0 mg/dL   Nitrite NEGATIVE  NEGATIVE   Leukocytes, UA NEGATIVE  NEGATIVE   Comment: MICROSCOPIC NOT DONE ON URINES WITH NEGATIVE PROTEIN, BLOOD, LEUKOCYTES, NITRITE, OR GLUCOSE <1000 mg/dL.  PREGNANCY, URINE     Status: None   Collection Time    11/11/12 12:22 PM      Result Value Range   Preg Test, Ur NEGATIVE  NEGATIVE   Comment:            THE SENSITIVITY OF THIS     METHODOLOGY IS >20 mIU/mL.  URINE RAPID DRUG SCREEN (HOSP PERFORMED)     Status: Abnormal   Collection Time    11/11/12 12:22 PM      Result Value Range   Opiates NONE DETECTED  NONE DETECTED   Cocaine NONE DETECTED  NONE DETECTED   Benzodiazepines NONE DETECTED  NONE DETECTED   Amphetamines NONE DETECTED  NONE DETECTED   Tetrahydrocannabinol POSITIVE (*) NONE DETECTED   Barbiturates NONE DETECTED  NONE DETECTED   Comment:            DRUG SCREEN FOR MEDICAL PURPOSES     ONLY.  IF CONFIRMATION IS NEEDED     FOR ANY PURPOSE, NOTIFY LAB     WITHIN 5 DAYS.                LOWEST DETECTABLE LIMITS     FOR URINE DRUG SCREEN     Drug Class       Cutoff (ng/mL)     Amphetamine      1000     Barbiturate      200     Benzodiazepine   200     Tricyclics       300     Opiates          300      Cocaine          300     THC              50    No results found.  Review of Systems  Constitutional: Negative.   HENT: Negative.   Eyes: Negative.   Respiratory: Negative.   Cardiovascular: Negative.   Gastrointestinal: Negative.   Genitourinary: Negative.   Musculoskeletal: Negative.   Skin: Negative.   Neurological: Negative.   Endo/Heme/Allergies: Negative.   Psychiatric/Behavioral: Positive for depression (Rates depression 10/10, hx of bipolar d/o on medication) and suicidal ideas (Reports OD last week on 40 plus capsules of her Seroquel last week, did not call 911 and did not inform anybody). Negative for hallucinations and memory loss. The patient is nervous/anxious (Rates at 9/10 worse when withdrawing). The patient does not have insomnia.    Blood pressure 134/88, pulse 90, temperature 98.4 F (36.9 C), temperature source Oral, resp. rate 16, last menstrual period 10/21/2012, SpO2 97.00%. Physical Exam  Constitutional: She is oriented to person, place, and time. She appears well-developed and well-nourished. No distress.  HENT:  Head: Normocephalic and atraumatic.  Eyes: Conjunctivae and EOM are normal. Right eye exhibits no discharge. Left eye exhibits no discharge. No scleral icterus.  Neck: Normal range of motion. Neck supple. No thyromegaly present.  Cardiovascular: Normal rate, regular rhythm, normal heart sounds and intact distal pulses.   Respiratory: Effort normal and breath sounds normal. No respiratory distress. She has no wheezes. She has no rales. She exhibits no tenderness.  GI: Soft. Bowel sounds are normal. She exhibits no distension and no mass. There is no tenderness. There is no rebound and no guarding.  Musculoskeletal: Normal range of motion. She exhibits no edema and no tenderness.  Neurological: She is alert and oriented to person, place, and time. No cranial nerve deficit. Coordination normal.  Skin: Skin is warm and dry. No rash noted. She is not  diaphoretic. No erythema. No pallor.   Diagnosis Axis 1: Alcohol dependence,hx Bipolar D/O Axis 11: Defer Axis 111: HTN, Anemia,DM,Chronic back pain, Morbid Obesity Axis 1V: Chronic alcoholism, homelessness, Psychosocial and environmental problems Axis V: GAF 30 Assessment/Plan: Will admit to our inpatient Chemical dependency unit for alcohol  Detoxification, safety and stabilization. Goal is to safely detox her and refer her to any long term rehabilitation facility.   Dahlia Byes, C   PMHNP-BC  11/11/2012, 1:15 PM

## 2012-11-12 ENCOUNTER — Encounter (HOSPITAL_COMMUNITY): Payer: Self-pay | Admitting: Psychiatry

## 2012-11-12 DIAGNOSIS — F1010 Alcohol abuse, uncomplicated: Secondary | ICD-10-CM

## 2012-11-12 DIAGNOSIS — F329 Major depressive disorder, single episode, unspecified: Secondary | ICD-10-CM

## 2012-11-12 DIAGNOSIS — F411 Generalized anxiety disorder: Secondary | ICD-10-CM

## 2012-11-12 DIAGNOSIS — F3289 Other specified depressive episodes: Secondary | ICD-10-CM

## 2012-11-12 LAB — VALPROIC ACID LEVEL: Valproic Acid Lvl: 121.2 ug/mL — ABNORMAL HIGH (ref 50.0–100.0)

## 2012-11-12 MED ORDER — CHLORDIAZEPOXIDE HCL 25 MG PO CAPS
25.0000 mg | ORAL_CAPSULE | Freq: Every day | ORAL | Status: AC
  Administered 2012-11-15: 25 mg via ORAL
  Filled 2012-11-12: qty 1

## 2012-11-12 MED ORDER — CHLORDIAZEPOXIDE HCL 25 MG PO CAPS
25.0000 mg | ORAL_CAPSULE | Freq: Once | ORAL | Status: AC
  Administered 2012-11-12: 25 mg via ORAL
  Filled 2012-11-12: qty 1

## 2012-11-12 MED ORDER — POTASSIUM CHLORIDE CRYS ER 10 MEQ PO TBCR
10.0000 meq | EXTENDED_RELEASE_TABLET | Freq: Two times a day (BID) | ORAL | Status: AC
  Administered 2012-11-12 – 2012-11-15 (×6): 10 meq via ORAL
  Filled 2012-11-12 (×6): qty 1

## 2012-11-12 MED ORDER — CHLORDIAZEPOXIDE HCL 25 MG PO CAPS
25.0000 mg | ORAL_CAPSULE | Freq: Four times a day (QID) | ORAL | Status: AC
  Administered 2012-11-12 (×3): 25 mg via ORAL
  Filled 2012-11-12 (×3): qty 1

## 2012-11-12 MED ORDER — CHLORDIAZEPOXIDE HCL 25 MG PO CAPS
25.0000 mg | ORAL_CAPSULE | ORAL | Status: AC
  Administered 2012-11-14 (×2): 25 mg via ORAL
  Filled 2012-11-12: qty 1

## 2012-11-12 MED ORDER — CHLORDIAZEPOXIDE HCL 25 MG PO CAPS
25.0000 mg | ORAL_CAPSULE | Freq: Three times a day (TID) | ORAL | Status: AC
  Administered 2012-11-13 (×2): 25 mg via ORAL
  Filled 2012-11-12 (×3): qty 1

## 2012-11-12 NOTE — Progress Notes (Signed)
Adult Psychoeducational Group Note  Date:  11/12/2012 Time: 1530  Group Topic/Focus:  Healthy Communication:   The focus of this group is to discuss communication, barriers to communication, as well as healthy ways to communicate with others.  Participation Level:  Active  Participation Quality:  Appropriate, Attentive and Sharing  Affect:  Appropriate  Cognitive:  Alert and Appropriate  Insight: Appropriate and Good  Engagement in Group:  Developing/Improving and Engaged  Modes of Intervention:  Discussion, Education, Problem-solving and Rapport Building  Additional Comments:  Pt was attentive and alert in group. Pt stated that her communication with has been rocky but wants to continue working on improving the relationship and learn what her love language was and ways to include interacting the skills learned with daughter and grand kids   Lynn Palmer [name] 11/12/2012, 6:36 PM
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
## 2012-11-12 NOTE — Progress Notes (Signed)
Patient ID: Lynn Palmer, female   DOB: [birth year], 52 y.o.   MRN: [id number]  D: Pt has been very flat and depressed on the unit. Pt has not attended any groups and has not engaged in treatment. Pt has been in the bed all day, and would refuse to take medications and to eat. Pt did eventually complete her self inventory sheet, she reported that she was extremely depressed and hopeless. She also reported that she was suicidal, however contracts for safety. Pt reported being negative HI, and no AH/VH noted.  A: 15 min checks continued for patient safety. R: Pts safety maintained.

## 2012-11-12 NOTE — Progress Notes (Signed)
Pt in bed all evening. Refusing group, asked that meds be brought to her. When asked what specifically was bothering her pt stated, "I'm just tired. It's not so much withdrawal. No pain." Pt disheveled. Poor eye contact. Medicated per orders, supported and encouraged to participate in programming. CIWA is a 3, BP slightly elevated. While she endorses passive SI earlier today she is able to deny at present. "I'm just depressed. No one answers my calls. I guess they just turn their phones off." Denies HI/AVH and remains safe. Lawrence Marseilles

## 2012-11-12 NOTE — [facility] Group Notes (Signed)
[facility] Group Notes:  (Nursing/MHT/Case Management/Adjunct)  Date:  11/12/2012  Time:  2:16 PM  Type of Therapy:  Psychoeducational Skills  Participation Level:  Did Not Attend   Lynn Palmer 11/12/2012, 2:16 PM

## 2012-11-12 NOTE — Progress Notes (Signed)
Adult Psychoeducational Group Note  Date:  11/12/2012 Time:  0840  Group Topic/Focus:  Goals Group:   The focus of this group is to help patients establish daily goals to achieve during treatment and discuss how the patient can incorporate goal setting into their daily lives to aide in recovery.  Participation Level:  Did Not Attend      Roselee Culver 11/12/2012, 12:29 PM

## 2012-11-12 NOTE — Progress Notes (Signed)
Patient did not attend the evening speaker AA meeting. Pt remained in bed during group.   
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
## 2012-11-12 NOTE — [facility] Group Notes (Signed)
[facility] Group Notes: (Clinical Social Work)   11/12/2012      Type of Therapy:  Group Therapy   Participation Level:  Did Not Attend    Ambrose Mantle, LCSW 11/12/2012, 12:05 PM

## 2012-11-13 NOTE — Progress Notes (Signed)
Initiation of 1:1  D: Pt was in the bed this morning and when this writer went to assess pt she reported that she had been falling all night and that she could not get up. Pt reported that her knees were very weak, and anytime she stands up she falls and therefore she could not come down to nursing station to get her medication so she was just going to refuse. A: Aggie NP made aware of situation, orders noted for a 1:1. R: Pt on a 1:1 for safety.

## 2012-11-13 NOTE — Progress Notes (Signed)
Pt. Is a 1:1 d/t reporting that she fell during the night last night. There is no evidence that the pt. Larey Seat but for safety we have a sitter with the pt.  Pt. Reported to Clinical research associate that she has difficulty walking anyway and writer ask why she has difficulty walking and she states because she has a bad knee, but pt. Appeared to have to think about it, she then blamed it on feeling tired and stated she had never felt this tired before.  Will continue 1:1 to maintain pt. Safety.

## 2012-11-13 NOTE — Progress Notes (Signed)
Adult Psychoeducational Group Note  Date:  11/13/2012 Time:  8:00PM Group Topic/Focus:  Wrap-Up Group:   The focus of this group is to help patients review their daily goal of treatment and discuss progress on daily workbooks.  Participation Level:  Active  Participation Quality:  Appropriate and Attentive  Affect:  Appropriate  Cognitive:  Alert and Appropriate  Insight: Appropriate  Engagement in Group:  Engaged  Modes of Intervention:  Discussion  Additional Comments:  Pt. Attended AA wrap up group.   Bing Plume D 11/13/2012, 8:25 PM
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
## 2012-11-13 NOTE — Progress Notes (Signed)
[facility] MD Progress Note  11/13/2012 2:45 PM Lynn Palmer  MRN:  [id number]  Subjective:  Lynn Palmer is endorsing feeling very depressed because she continues to relapse on alcohol. Says she got mad and took bunch of Depakote pills just to help her fall asleep, drank 4 bottles of 40 ounce beer to help the Depakote work better. She reports feeling weak and have had 2 separate falls today, none of the falls are witnessed. She complains of feeling woozy when in a standing position or during ambulation. Says she is still having withdrawal symptoms of alcohol, dizziness, mild tremors, nausea, vomiting..   Diagnosis:   Axis I: Bipolar disorder, Alcohol dependence Axis II: Deferred Axis III:  Past Medical History  Diagnosis Date  . Bipolar 1 disorder   . Obesity   . Diabetes mellitus   . Hyperlipidemia   . Hypertension   . Vomiting   . Depression   . Anxiety   . Substance abuse     alcohol    Axis IV: Alcoholism Axis V: 41-50 serious symptoms  ADL's:  Fairly intact  Sleep: Good  Appetite:  Fair  Suicidal Ideation:  Denies Homicidal Ideation:  Denies  AEB (as evidenced by):  Psychiatric Specialty Exam: Review of Systems  Constitutional: Positive for malaise/fatigue.  HENT: Negative.   Eyes: Negative.   Respiratory: Negative.   Cardiovascular: Negative.   Gastrointestinal: Negative.   Genitourinary: Negative.   Musculoskeletal: Negative.   Skin: Negative.   Neurological: Positive for dizziness and weakness.  Endo/Heme/Allergies: Negative.   Psychiatric/Behavioral: Positive for depression and substance abuse (Alcohol dependence). Negative for suicidal ideas, hallucinations and memory loss. The patient is nervous/anxious. The patient does not have insomnia.     Blood pressure 138/97, pulse 80, temperature 97.6 F (36.4 C), temperature source Oral, resp. rate 20, height 5' (1.524 m), weight 77.565 kg (171 lb), last menstrual period 10/21/2012, SpO2 100.00%.Body mass index is  33.4 kg/(m^2).  General Appearance: Casual and Fairly Groomed  Patent attorney::  Good  Speech:  Clear and Coherent  Volume:  Normal  Mood:  Depressed  Affect:  Flat  Thought Process:  Coherent and Intact  Orientation:  Full (Time, Place, and Person)  Thought Content:  Rumination  Suicidal Thoughts:  No  Homicidal Thoughts:  No  Memory:  Immediate;   Good Recent;   Fair Remote;   Fair  Judgement:  Fair  Insight:  Fair  Psychomotor Activity:  Decreased  Concentration:  Fair  Recall:  Fair  Akathisia:  No  Handed:  Right  AIMS (if indicated):     Assets:  Desire for Improvement  Sleep:  Number of Hours: 5.5   Current Medications: Current Facility-Administered Medications  Medication Dose Route Frequency Provider Last Rate Last Dose  . acetaminophen (TYLENOL) tablet 650 mg  650 mg Oral Q6H PRN Earney Navy, NP   650 mg at 11/12/12 1844  . alum & mag hydroxide-simeth (MAALOX/MYLANTA) 200-200-20 MG/5ML suspension 30 mL  30 mL Oral Q4H PRN Earney Navy, NP      . amLODipine (NORVASC) tablet 5 mg  5 mg Oral Daily Earney Navy, NP   5 mg at 11/13/12 1027  . chlordiazePOXIDE (LIBRIUM) capsule 25 mg  25 mg Oral Q6H PRN Earney Navy, NP      . chlordiazePOXIDE (LIBRIUM) capsule 25 mg  25 mg Oral TID Nanine Means, NP   25 mg at 11/13/12 1027   Followed by  . [START ON 11/14/2012] chlordiazePOXIDE (LIBRIUM)  capsule 25 mg  25 mg Oral BH-qamhs Nanine Means, NP       Followed by  . [START ON 11/15/2012] chlordiazePOXIDE (LIBRIUM) capsule 25 mg  25 mg Oral Daily Nanine Means, NP      . gabapentin (NEURONTIN) capsule 200 mg  200 mg Oral BID Earney Navy, NP   200 mg at 11/13/12 1026  . hydrOXYzine (ATARAX/VISTARIL) tablet 25 mg  25 mg Oral Q6H PRN Earney Navy, NP      . loperamide (IMODIUM) capsule 2-4 mg  2-4 mg Oral PRN Earney Navy, NP      . magnesium hydroxide (MILK OF MAGNESIA) suspension 30 mL  30 mL Oral Daily PRN Earney Navy, NP      .  metFORMIN (GLUCOPHAGE) tablet 500 mg  500 mg Oral BID WC Earney Navy, NP   500 mg at 11/13/12 1026  . metoprolol (LOPRESSOR) tablet 50 mg  50 mg Oral BID Earney Navy, NP   50 mg at 11/13/12 1027  . multivitamin with minerals tablet 1 tablet  1 tablet Oral Daily Earney Navy, NP   1 tablet at 11/13/12 1027  . ondansetron (ZOFRAN-ODT) disintegrating tablet 4 mg  4 mg Oral Q6H PRN Earney Navy, NP      . potassium chloride (K-DUR,KLOR-CON) CR tablet 10 mEq  10 mEq Oral BID Nanine Means, NP   10 mEq at 11/13/12 1026  . QUEtiapine (SEROQUEL) tablet 400 mg  400 mg Oral QHS Earney Navy, NP   400 mg at 11/12/12 2138  . simvastatin (ZOCOR) tablet 5 mg  5 mg Oral q1800 Earney Navy, NP   5 mg at 11/12/12 1709  . thiamine (B-1) injection 100 mg  100 mg Intramuscular Once Earney Navy, NP      . thiamine (VITAMIN B-1) tablet 100 mg  100 mg Oral Daily Earney Navy, NP   100 mg at 11/13/12 1026  . traZODone (DESYREL) tablet 100 mg  100 mg Oral QHS Earney Navy, NP   100 mg at 11/12/12 2138    Lab Results:  Results for orders placed during the hospital encounter of 11/11/12 (from the past 48 hour(s))  GLUCOSE, CAPILLARY     Status: Abnormal   Collection Time    11/11/12  5:13 PM      Result Value Range   Glucose-Capillary 106 (*) 70 - 99 mg/dL  VALPROIC ACID LEVEL     Status: Abnormal   Collection Time    11/12/12  6:00 AM      Result Value Range   Valproic Acid Lvl 121.2 (*) 50.0 - 100.0 ug/mL    Physical Findings: AIMS:  , ,  ,  ,    CIWA:  CIWA-Ar Total: 0 COWS:     Treatment Plan Summary: Daily contact with patient to assess and evaluate symptoms and progress in treatment Medication management  Plan: Supportive approach/coping skills/relapse prevention. Continue 1:1 supervision for just 11/13/12. Continue use of wheel chair to aid mobility. Recheck Depakote levels in am. Encouraged out of room, participation in group sessions and  application of coping skills when distressed. Will continue to monitor response to/adverse effects of medications in use to assure effectiveness. Continue to monitor mood, behavior and interaction with staff and other patients. Continue current plan of care.  Medical Decision Making Problem Points:  Review of last therapy session (1) and Review of psycho-social stressors (1) Data Points:  Review of medication regiment &  side effects (2) Review of new medications or change in dosage (2)  I certify that inpatient services furnished can reasonably be expected to improve the patient's condition.   Armandina Stammer I, PMHNP-BC 11/13/2012, 2:45 PM

## 2012-11-13 NOTE — Progress Notes (Addendum)
Pt up to nurses' station @0655  asking for assistance with shaving the hair on her chin. This Clinical research associate grabbed supplies and walked with pt to her room to assist. Pt then stated, "I fell twice over night. I guess I need to tell someone." Pt states she got up to go to the bathroom and mistook the chair for the bed - however these two items are on opposite walls - and fell to her knees. No swelling or sign of injury. No complaints of pain. Pt also states she "slid out of bed this morning on to her side." Again, no bruising or injury noted. Pt is unable to report time of either event nor did MHT on hall see or hear either event. When pt was asked why she didn't report these events when they happened pt states, "I just didn't feel like it." Reviewed high fall precautions, pt acknowledges understanding. NP on call, charge nurse and [facility] notified. VS obtained and stable.  Will continue to monitor. Lawrence Marseilles

## 2012-11-13 NOTE — Progress Notes (Addendum)
1:1 Note   D: Pt has been in the bed resting, pt does respond to the calling of her name. Pt did not appear to be in any distress. Pt reported being negative SI/HI, no AH/VH noted. A: 1:1 continued for patient safety. R: Pt safety maintained.

## 2012-11-13 NOTE — BHH Group Notes (Signed)
BHH Group Notes:  (Clinical Social Work)  11/13/2012  10:00-11:00AM  Summary of Progress/Problems:   The main focus of today's process group was to   identify the patient's current support system and decide on other supports that can be put in place.  Four definitions/levels of support were discussed and an exercise was utilized to show how much stronger we become with additional supports.  An emphasis was placed on using counselor, doctor, therapy groups, 12-step groups, and problem-specific support groups to expand supports, as well as doing something different than has been done before. The patient expressed a willingness to add an AA meeting daily, instead of "occasional" use of them.  She also needs to get a sponsor, she stated, as well as a Veterinary surgeon.  Finally she stated she has not been taking her medications as prescribed and needs to start doing so.  Type of Therapy:  Process Group with Motivational Interviewing  Participation Level:  Active  Participation Quality:  Attentive and Sharing  Affect:  Blunted and Depressed  Cognitive:  Oriented  Insight:  Developing/Improving  Engagement in Therapy:  Developing/Improving  Modes of Intervention:   Education, Support and Processing, Activity  Ambrose Mantle, LCSW 11/13/2012, 12:23 PM

## 2012-11-13 NOTE — Progress Notes (Signed)
1:1 Note  D: Pt has been in the bed resting, pt dis respond to the calling of her name. Pt did not appear to be in any distress. Pt reported being negative SI/HI, no AH/VH noted. A: 1:1 continued for patient safety. R: Pt safety maintained.

## 2012-11-13 NOTE — Progress Notes (Signed)
Patient ID: Lynn Palmer, female   DOB: [birth year], 52 y.o.   MRN: [id number]  D: Pt has been very flat and depressed on the unit. Pt has not attended any groups and has not engaged in treatment. Pt has been in the bed all day, and would refuse to take medications and to eat. Pt did eventually complete her self inventory sheet, she reported that she was extremely depressed and hopeless. She also reported that she was suicidal, however contracts for safety. Pt reported being negative HI, and no AH/VH noted. A: 1:1 continued for patient safety. R: Pts safety maintained.

## 2012-11-13 NOTE — BHH Group Notes (Signed)
BHH Group Notes:  (Nursing/MHT/Case Management/Adjunct)  Date:  11/13/2012  Time:  2:31 PM  Type of Therapy: Psychoeducational Skills  Participation Level: Did Not Attend     Lynn Palmer [name] 11/13/2012, 2:31 PM 

## 2012-11-14 DIAGNOSIS — F1020 Alcohol dependence, uncomplicated: Secondary | ICD-10-CM

## 2012-11-14 DIAGNOSIS — F10939 Alcohol use, unspecified with withdrawal, unspecified: Secondary | ICD-10-CM

## 2012-11-14 DIAGNOSIS — F319 Bipolar disorder, unspecified: Secondary | ICD-10-CM

## 2012-11-14 DIAGNOSIS — F10239 Alcohol dependence with withdrawal, unspecified: Secondary | ICD-10-CM

## 2012-11-14 DIAGNOSIS — F1410 Cocaine abuse, uncomplicated: Secondary | ICD-10-CM

## 2012-11-14 MED ORDER — DIVALPROEX SODIUM ER 500 MG PO TB24
500.0000 mg | ORAL_TABLET | Freq: Every day | ORAL | Status: DC
  Administered 2012-11-15 – 2012-11-17 (×3): 500 mg via ORAL
  Filled 2012-11-14: qty 12
  Filled 2012-11-14 (×4): qty 1

## 2012-11-14 MED ORDER — HYDROXYZINE HCL 25 MG PO TABS
25.0000 mg | ORAL_TABLET | Freq: Four times a day (QID) | ORAL | Status: DC | PRN
  Administered 2012-11-14 – 2012-11-17 (×8): 25 mg via ORAL
  Filled 2012-11-14 (×4): qty 1
  Filled 2012-11-14: qty 10

## 2012-11-14 MED ORDER — DIVALPROEX SODIUM ER 500 MG PO TB24: 500.0000 mg | ORAL_TABLET | Freq: Every day | ORAL | Status: DC

## 2012-11-14 MED ORDER — DIVALPROEX SODIUM ER 500 MG PO TB24
1000.0000 mg | ORAL_TABLET | Freq: Every day | ORAL | Status: DC
  Administered 2012-11-14 – 2012-11-16 (×3): 1000 mg via ORAL
  Filled 2012-11-14 (×4): qty 2

## 2012-11-14 NOTE — Progress Notes (Signed)
Adult Psychoeducational Group Note  Date:  11/14/2012 Time:  3:42 PM  Group Topic/Focus:  Stages of Change:   The focus of this group is to explain the stages of change and help patients identify changes they want to make upon discharge.  Participation Level:  Minimal  Participation Quality:  Appropriate and Attentive  Affect:  Appropriate  Cognitive:  Appropriate  Insight: Appropriate  Engagement in Group:  Developing/Improving  Modes of Intervention:  Activity, Discussion and Socialization  Additional Comments:  Lynn Palmer attended and shared at times during group. Patient defined self-care in own terms, along with the completion of self-care assessment. In the assessment patient was asked to rate areas based on psychological, physical, emotional, spiritual, relationship care. Patient shared weakness and strengths and was asked to make a goal on the area of weakness and how improvement can be made.  Lynn Palmer 11/14/2012, 3:42 PM

## 2012-11-14 NOTE — BHH Group Notes (Signed)
BHH LCSW Group Therapy  11/14/2012 3:24 PM  Type of Therapy:  Group Therapy  Participation Level:  Active  Participation Quality:  Attentive  Affect:  Appropriate  Cognitive:  Alert  Insight:  Improving  Engagement in Therapy:  Engaged  Modes of Intervention:  Confrontation, Discussion, Education, Exploration, Socialization and Support  Summary of Progress/Problems: Today's Topic: Overcoming Obstacles. Pt identified obstacles faced currently and processed barriers involved in overcoming these obstacles. Pt identified steps necessary for overcoming these obstacles and explored motivation (internal and external) for facing these difficulties head on. Pt further identified one area of concern in their lives and chose a skill of focus pulled from their "toolbox." Lynn Palmer stated that her biggest anticipated obstacle involves "getting all my belongings out of that house and into my possession. I'm worried about my shower curtain and shower rod." When confronted by another group member about priortizing what "really matters and what can be easily fixed" Lynn Palmer laughed, acknowledged taht she can just get another shower curtain, and stated that she is just overwhelmed with small issues. Lynn Palmer does not feel that temptation to use will be an obstacle for her. "As long as I get myself out of bed each morning to make it to an AA meeting at 8am, my day will go wonderfully and I will have no cravings or urges or temptation. AA every morning is my goal."    Smart, Lynn Palmer 11/14/2012, 3:24 PM

## 2012-11-14 NOTE — Progress Notes (Signed)
[facility] MD Progress Note  11/14/2012 3:19 PM Lynn Palmer  MRN:  [id number] Subjective:  Lynn Palmer continues to have a hard time. She realizes she "messed up." She claims she took the Depakote to "calm herself." as she was very anxious. She states she did not want to hurt herself. She wants to go back on it as without it her mood fluctuates "all over the place." states that she knows she has to abstain from being involved in relationships.  Diagnosis:  Bipolar Disorder, Alcohol Dependence/withdrawal, Cocaine Abuse  ADL's:  Intact  Sleep: Fair  Appetite:  Fair  Suicidal Ideation:  Plan:  denies Intent:  denies Means:  denies Homicidal Ideation:  Plan:  denies Intent:  denies Means:  denies AEB (as evidenced by):  Psychiatric Specialty Exam: Review of Systems  Constitutional: Negative.   HENT: Negative.   Eyes: Negative.   Respiratory: Negative.   Cardiovascular: Negative.   Gastrointestinal: Negative.   Genitourinary: Negative.   Musculoskeletal: Negative.   Skin: Negative.   Neurological: Positive for dizziness.  Endo/Heme/Allergies: Negative.   Psychiatric/Behavioral: Positive for depression and substance abuse. The patient is nervous/anxious.     Blood pressure 133/79, pulse 72, temperature 97.4 F (36.3 C), temperature source Oral, resp. rate 16, height 5' (1.524 m), weight 77.565 kg (171 lb), last menstrual period 10/21/2012, SpO2 100.00%.Body mass index is 33.4 kg/(m^2).  General Appearance: Fairly Groomed  Patent attorney::  Fair  Speech:  Clear and Coherent  Volume:  fluctuates  Mood:  Anxious, Depressed and worried  Affect:  anxious, worried  Thought Process:  Coherent and Goal Directed  Orientation:  Full (Time, Place, and Person)  Thought Content:  worries, concerns, fears  Suicidal Thoughts:  No  Homicidal Thoughts:  No  Memory:  Immediate;   Fair Recent;   Fair Remote;   Fair  Judgement:  Poor  Insight:  superficial  Psychomotor Activity:  Restlessness   Concentration:  Fair  Recall:  Fair  Akathisia:  No  Handed:  Right  AIMS (if indicated):     Assets:  Desire for Improvement  Sleep:  Number of Hours: 6.25   Current Medications: Current Facility-Administered Medications  Medication Dose Route Frequency Provider Last Rate Last Dose  . acetaminophen (TYLENOL) tablet 650 mg  650 mg Oral Q6H PRN Earney Navy, NP   650 mg at 11/14/12 1437  . alum & mag hydroxide-simeth (MAALOX/MYLANTA) 200-200-20 MG/5ML suspension 30 mL  30 mL Oral Q4H PRN Earney Navy, NP      . amLODipine (NORVASC) tablet 5 mg  5 mg Oral Daily Earney Navy, NP   5 mg at 11/14/12 0836  . chlordiazePOXIDE (LIBRIUM) capsule 25 mg  25 mg Oral Q6H PRN Earney Navy, NP      . chlordiazePOXIDE (LIBRIUM) capsule 25 mg  25 mg Oral BH-qamhs Nanine Means, NP   25 mg at 11/14/12 0836   Followed by  . [START ON 11/15/2012] chlordiazePOXIDE (LIBRIUM) capsule 25 mg  25 mg Oral Daily Nanine Means, NP      . divalproex (DEPAKOTE ER) 24 hr tablet 1,000 mg  1,000 mg Oral QHS Rachael Fee, MD      . Melene Muller ON 11/15/2012] divalproex (DEPAKOTE ER) 24 hr tablet 500 mg  500 mg Oral Daily Rachael Fee, MD      . gabapentin (NEURONTIN) capsule 200 mg  200 mg Oral BID Earney Navy, NP   200 mg at 11/14/12 0836  . hydrOXYzine (ATARAX/VISTARIL)  tablet 25 mg  25 mg Oral Q6H PRN Rachael Fee, MD   25 mg at 11/14/12 1437  . loperamide (IMODIUM) capsule 2-4 mg  2-4 mg Oral PRN Earney Navy, NP      . magnesium hydroxide (MILK OF MAGNESIA) suspension 30 mL  30 mL Oral Daily PRN Earney Navy, NP      . metFORMIN (GLUCOPHAGE) tablet 500 mg  500 mg Oral BID WC Earney Navy, NP   500 mg at 11/14/12 0836  . metoprolol (LOPRESSOR) tablet 50 mg  50 mg Oral BID Earney Navy, NP   50 mg at 11/14/12 0836  . multivitamin with minerals tablet 1 tablet  1 tablet Oral Daily Earney Navy, NP   1 tablet at 11/14/12 0836  . ondansetron (ZOFRAN-ODT) disintegrating  tablet 4 mg  4 mg Oral Q6H PRN Earney Navy, NP      . potassium chloride (K-DUR,KLOR-CON) CR tablet 10 mEq  10 mEq Oral BID Nanine Means, NP   10 mEq at 11/14/12 0836  . QUEtiapine (SEROQUEL) tablet 400 mg  400 mg Oral QHS Earney Navy, NP   400 mg at 11/13/12 2152  . simvastatin (ZOCOR) tablet 5 mg  5 mg Oral q1800 Earney Navy, NP   5 mg at 11/13/12 1827  . thiamine (B-1) injection 100 mg  100 mg Intramuscular Once Earney Navy, NP      . thiamine (VITAMIN B-1) tablet 100 mg  100 mg Oral Daily Earney Navy, NP   100 mg at 11/14/12 0836  . traZODone (DESYREL) tablet 100 mg  100 mg Oral QHS Earney Navy, NP   100 mg at 11/13/12 2152    Lab Results:  Results for orders placed during the hospital encounter of 11/11/12 (from the past 48 hour(s))  VALPROIC ACID LEVEL     Status: Abnormal   Collection Time    11/14/12  6:15 AM      Result Value Range   Valproic Acid Lvl 12.3 (*) 50.0 - 100.0 ug/mL    Physical Findings: AIMS: Facial and Oral Movements Muscles of Facial Expression: None, normal Lips and Perioral Area: None, normal Jaw: None, normal Tongue: None, normal,Extremity Movements Upper (arms, wrists, hands, fingers): None, normal Lower (legs, knees, ankles, toes): None, normal, Trunk Movements Neck, shoulders, hips: None, normal, Overall Severity Severity of abnormal movements (highest score from questions above): None, normal Incapacitation due to abnormal movements: None, normal Patient's awareness of abnormal movements (rate only patient's report): No Awareness, Dental Status Current problems with teeth and/or dentures?: No Does patient usually wear dentures?: No  CIWA:  CIWA-Ar Total: 3 COWS:     Treatment Plan Summary: Daily contact with patient to assess and evaluate symptoms and progress in treatment Medication management  Plan: Supportive approach/coping skills/relapse prevention           Complete the detox           Resume the  Depakote as level is 12.3  Medical Decision Making Problem Points:  Review of psycho-social stressors (1) Data Points:  Review of medication regiment & side effects (2)  I certify that inpatient services furnished can reasonably be expected to improve the patient's condition.   Paislie Tessler A 11/14/2012, 3:19 PM
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
## 2012-11-14 NOTE — BHH Group Notes (Signed)
[facility] LCSW Aftercare Discharge Planning Group Note   11/14/2012 4:06 PM  Participation Quality:  Minimal  Mood/Affect:  Tearful  Depression Rating:  10  Anxiety Rating:  10  Thoughts of Suicide:  No Will you contract for safety?   NA  Current AVH:  No  Plan for Discharge/Comments:  Pt plans to go to [facility]/set up by current landlord and also asked for list of female [facility] houses (CSW provided pt with this list). She plans to go to [facility] IOP at [facility] o/p (insurance approved her for this (12 sessions). Pt met with Dewayne Hatch in o/p today.   Transportation Means: unknown at this time.   Supports: limited/boyfriend who is in [employer], [place]

## 2012-11-14 NOTE — Tx Team (Signed)
Interdisciplinary Treatment Plan Update (Adult)  Date: 11/14/2012   Time Reviewed: 4:00 PM  Progress in Treatment:  Attending groups: yes Participating in groups:  Yes Taking medication as prescribed: Yes  Tolerating medication: Yes  Family/Significant othe contact made: Not yet. SPE required for this pt.   Patient understands diagnosis: Yes, AEB seeking treatment for ETOH detox, SI, and depression. Discussing patient identified problems/goals with staff: Yes  Medical problems stabilized or resolved: Yes  Denies suicidal/homicidal ideation: Yes, in group. Self report.  Patient has not harmed self or Others: Yes  New problem(s) identified: n/a  Discharge Plan or Barriers: CSW provided pt with list of [place] houses. She plans to either move into an [facility] or move into [facility] that may be available to her by previous landlord. Pt  Interested in [facility] IOP with [facility] o/p. [employer] approved her for this.  Additional comments: Lynn Palmer is an 52 y.o. female who presents to the ED requesting alcohol detox. Pt reports she is not currently SI however has felt SI over the past few days, and last Friday Pt reports taking a whole bottle of seroquel 30 or more pills. Patient reports she was hoping she would just go to sleep and die. Patient reports she did nothing after taking the medications. Patient denies HI/AH/VH. Patient reports drinking 3-4 40 oz beers daily, for the past few weeks. Patient reports she also tried marijuana this past few weeks. Patient reports she smoked 1 blunt. Pt reports she wants to stop drinking. Patient reports she was discharged from [facility] to [facility] for treatment, hwoever was placed in a recovery house. Pt reports she is currently in the mens recovery house due to there not being an available bed in the women's recovery house. Pt reprots the owner, Caesar Bookman promises to place patient in female recovery house once she completes detox. Pt feels that due to being  in the men environment where they were drinking and hitting on her, it was easier to begin drinking again.  Pt reports Wednesday night was her last drink when her girl friend [name] picked her up. Pt reports she blacks out and can't remember everything. Pt reports getting mad at something when she was drunk and hitting the tv into the floor. Reason for Continuation of Hospitalization: Librium taper-withdrawals Mood stabilization Medication management Estimated length of stay: 2-3 days For review of initial/current patient goals, please see plan of care.  Attendees:  Patient:    Family:    Physician: Geoffery Lyons MD 11/14/2012 4:00 PM   Nursing: Lupita Leash RN 11/14/2012 4:00 PM   Clinical Social Worker [employer], LCSWA  11/14/2012 4:00 PM   Other: Maureen Ralphs RN 11/14/2012 4:00 PM   Other: Aggie PA 11/14/2012 4:00 PM   Other: Massie Kluver, Community Care Coordinator  11/14/2012 4:00 PM   Other: Darden Dates Nurse CM 11/14/2012 4:00 PM   Scribe for Treatment Team:  Herbert Seta Smart LCSWA 11/14/2012 4:00 PM

## 2012-11-14 NOTE — Progress Notes (Addendum)
Pt lying in the bed stating,"I am just thinking what is after this place." Pt stated she had two options- one is to go to the [facility] and the other is to go to a house that is still being fixed up.. Pt denies SI or HI and contracts for safety. She stated she has been attending groups but feels very tired. Instructed pt to stand up slowly and to notify the staff if she feels weak , dizzy or lightheaded. Both siderails remain up.Pt is pleasant and cooperative. She did state she felt the [facility] might be better for her as she fears she might relapse if placed somewhere else. At 6:30pm pt was found in her room hysterically crying stating,"that landlord ,Jonny Ruiz hangs up on me." Pt stated she can not go back and live in his house and feels noone cares about her anymore. She stated all her mom does is cry and her daughter drinks all the time. Tried to calm pt down and then she received a phone call,.Offered to contact the minister and pt stated she already saw him today. Pt refused to go to the AA meeting after telling the nurse earlier she thought she would benefit from going. Pt remained in her bed following dinner.

## 2012-11-15 NOTE — Progress Notes (Signed)
Adult Psychoeducational Group Note  Date:  11/15/2012 Time:  10:44 PM  Group Topic/Focus:  Goals Group:   The focus of this group is to help patients establish daily goals to achieve during treatment and discuss how the patient can incorporate goal setting into their daily lives to aide in recovery.  Participation Level:  Active  Participation Quality:  Appropriate  Affect:  Appropriate  Cognitive:  Appropriate  Insight: Appropriate  Engagement in Group:  Engaged  Modes of Intervention:  Discussion  Additional Comments:  Pt stated she finally found a place to go when she is discharged. Pt continues to stress about getting her items for her old home but is glad she finally found placement.  Terie Purser R 11/15/2012, 10:44 PM

## 2012-11-15 NOTE — Progress Notes (Signed)
Patient ID: Lynn Palmer, female   DOB: 1960-11-01, 51 y.o.   MRN: [id number]  D:  Pt is passive SI, but contracts for safety. Pt denies HI/AV. Pt is pleasant and cooperative. Pt continues to be sad, depressed, argumentative, but pt is happy about going to [facility]. Pt just upset because she does not think her former landlord will allow her to get her stuff.   A: Pt was offered support and encouragement. Pt was given scheduled medications. Pt was encourage to attend groups. Q 15 minute checks were done for safety.   R:Pt attends groups and interacts well with peers and staff. Pt is taking medication. Pt receptive to treatment and safety maintained on unit.

## 2012-11-15 NOTE — Progress Notes (Signed)
[facility] MD Progress Note  11/15/2012 3:02 PM TIMOTHY TOWNSEL  MRN:  [id number] Subjective: Rion endorses that she has realized she cant quit working on her recovery and that it is going to be a life long situation. She admits that " I don't do stupid things when I am sober." She also states she cant be involved in a relationship until she can regain her sobriety, be back working the program at least for a year. She was accepted to an [employer] [facility]. She states that the structure that they provider help her a lot in the past. She is back on the Depakote, reports no side effects. Diagnosis:  Bipolar Disorder, Alcohol Dependence  ADL's:  Intact  Sleep: Fair  Appetite:  Fair  Suicidal Ideation:  Plan:  Denies Intent:  denies Means:  denies Homicidal Ideation:  Plan:  denies Intent:  denies Means:  denies AEB (as evidenced by):  Psychiatric Specialty Exam: Review of Systems  Constitutional: Negative.   HENT: Negative.   Eyes: Negative.   Respiratory: Negative.   Cardiovascular: Negative.   Gastrointestinal: Negative.   Genitourinary: Negative.   Musculoskeletal: Positive for back pain.  Skin: Negative.   Neurological: Negative.   Endo/Heme/Allergies: Negative.   Psychiatric/Behavioral: Positive for substance abuse. The patient is nervous/anxious.     Blood pressure 113/79, pulse 84, temperature 98.7 F (37.1 C), temperature source Oral, resp. rate 18, height 5' (1.524 m), weight 77.565 kg (171 lb), last menstrual period 10/21/2012, SpO2 100.00%.Body mass index is 33.4 kg/(m^2).  General Appearance: Fairly Groomed  Patent attorney::  Fair  Speech:  Clear and Coherent  Volume:  Normal  Mood:  Anxious and worried  Affect:  anxious, worried  Thought Process:  Coherent and Goal Directed  Orientation:  Full (Time, Place, and Person)  Thought Content:  worries, concerns  Suicidal Thoughts:  No  Homicidal Thoughts:  No  Memory:  Immediate;   Fair Recent;   Fair Remote;   Fair   Judgement:  Fair  Insight:  Present  Psychomotor Activity:  Restlessness  Concentration:  Fair  Recall:  Fair  Akathisia:  No  Handed:  Right  AIMS (if indicated):     Assets:  Desire for Improvement  Sleep:  Number of Hours: 6.25   Current Medications: Current Facility-Administered Medications  Medication Dose Route Frequency Provider Last Rate Last Dose  . acetaminophen (TYLENOL) tablet 650 mg  650 mg Oral Q6H PRN Earney Navy, NP   650 mg at 11/15/12 0813  . alum & mag hydroxide-simeth (MAALOX/MYLANTA) 200-200-20 MG/5ML suspension 30 mL  30 mL Oral Q4H PRN Earney Navy, NP      . amLODipine (NORVASC) tablet 5 mg  5 mg Oral Daily Earney Navy, NP   5 mg at 11/15/12 0815  . divalproex (DEPAKOTE ER) 24 hr tablet 1,000 mg  1,000 mg Oral QHS Rachael Fee, MD   1,000 mg at 11/14/12 2134  . divalproex (DEPAKOTE ER) 24 hr tablet 500 mg  500 mg Oral Daily Rachael Fee, MD   500 mg at 11/15/12 7829  . gabapentin (NEURONTIN) capsule 200 mg  200 mg Oral BID Earney Navy, NP   200 mg at 11/15/12 0814  . hydrOXYzine (ATARAX/VISTARIL) tablet 25 mg  25 mg Oral Q6H PRN Rachael Fee, MD   25 mg at 11/15/12 5621  . magnesium hydroxide (MILK OF MAGNESIA) suspension 30 mL  30 mL Oral Daily PRN Earney Navy, NP      .  metFORMIN (GLUCOPHAGE) tablet 500 mg  500 mg Oral BID WC Earney Navy, NP   500 mg at 11/15/12 0814  . metoprolol (LOPRESSOR) tablet 50 mg  50 mg Oral BID Earney Navy, NP   50 mg at 11/15/12 0814  . multivitamin with minerals tablet 1 tablet  1 tablet Oral Daily Earney Navy, NP   1 tablet at 11/15/12 0815  . QUEtiapine (SEROQUEL) tablet 400 mg  400 mg Oral QHS Earney Navy, NP   400 mg at 11/14/12 2134  . simvastatin (ZOCOR) tablet 5 mg  5 mg Oral q1800 Earney Navy, NP   5 mg at 11/14/12 1713  . thiamine (B-1) injection 100 mg  100 mg Intramuscular Once Earney Navy, NP      . thiamine (VITAMIN B-1) tablet 100 mg  100 mg  Oral Daily Earney Navy, NP   100 mg at 11/15/12 0815  . traZODone (DESYREL) tablet 100 mg  100 mg Oral QHS Earney Navy, NP   100 mg at 11/14/12 2134    Lab Results:  Results for orders placed during the hospital encounter of 11/11/12 (from the past 48 hour(s))  VALPROIC ACID LEVEL     Status: Abnormal   Collection Time    11/14/12  6:15 AM      Result Value Range   Valproic Acid Lvl 12.3 (*) 50.0 - 100.0 ug/mL    Physical Findings: AIMS: Facial and Oral Movements Muscles of Facial Expression: None, normal Lips and Perioral Area: None, normal Jaw: None, normal Tongue: None, normal,Extremity Movements Upper (arms, wrists, hands, fingers): None, normal Lower (legs, knees, ankles, toes): None, normal, Trunk Movements Neck, shoulders, hips: None, normal, Overall Severity Severity of abnormal movements (highest score from questions above): None, normal Incapacitation due to abnormal movements: None, normal Patient's awareness of abnormal movements (rate only patient's report): No Awareness, Dental Status Current problems with teeth and/or dentures?: No Does patient usually wear dentures?: No  CIWA:  CIWA-Ar Total: 0 COWS:     Treatment Plan Summary: Daily contact with patient to assess and evaluate symptoms and progress in treatment Medication management  Plan: Supportive approach/coping skills/relapse prevention            Optimize treatment with psychotropics  Medical Decision Making Problem Points:  Review of psycho-social stressors (1) Data Points:  Review of medication regiment & side effects (2) Review of new medications or change in dosage (2)  I certify that inpatient services furnished can reasonably be expected to improve the patient's condition.   Tuere Nwosu A 11/15/2012, 3:02 PM

## 2012-11-15 NOTE — Progress Notes (Signed)
Pt up at nurses' station complaining of knee pain of an 8/10 as well as anxiety. Asking for vistaril and pain meds. "Ive just had a tough afternoon. My landlord just isn't doing me right. I have a phone interview in the morning for [name] and I'm just nervous." Medicated per orders. Support, reassurance provided. Reinforced high fall risk precautions. Pt responds, "while my knee hurts, I am feeling stronger when I walk." Denies SI/HI/aVH and remains safe at this time. Lynn Palmer

## 2012-11-15 NOTE — Progress Notes (Signed)
Patient ID: Lynn Palmer, female   DOB: 1961-03-30, 52 y.o.   MRN: [id number] She has been up and to groups interacting with peers and staff. She has been less anxious after she found out she has a place to go on discharge.

## 2012-11-15 NOTE — [facility] Group Notes (Signed)
[facility] LCSW Group Therapy  11/15/2012 1:53 PM  Type of Therapy:  Group Therapy  Participation Level:  Did Not Attend  Smart, Kizer Nobbe 11/15/2012, 1:53 PM

## 2012-11-15 NOTE — Progress Notes (Signed)
Adult Psychoeducational Group Note  Date:  11/15/2012 Time:  11:28 AM  Group Topic/Focus:  Theraputic Activity  Participation Level:  Active  Participation Quality:  Appropriate  Affect:  Appropriate  Cognitive:  Appropriate  Insight: Appropriate  Engagement in Group:  Engaged  Modes of Intervention:  Activity  Additional Comments:  Lynn Palmer was active in group.   Nichola Sizer 11/15/2012, 11:28 AM

## 2012-11-15 NOTE — Progress Notes (Signed)
Recreation Therapy Notes  Date: 08.05.2014 Time: 2:45pm Location: [address]  Group Topic: Animal Assisted Activities  Goal Area(s) Addresses:  Patient will interact appropriately with dog team.    Behavioral Response: Did not attend.  Marykay Lex Tache Bobst, LRT/CTRS  [name] 11/15/2012 4:32 PM

## 2012-11-15 NOTE — Progress Notes (Signed)
Recreation Therapy Notes  Date: 08.05.2014 Time: 3:10pm Location: [address]  Group Topic: Problem Solving  Goal Area(s) Addresses:  Patient will use effective problem solving techniques to solve puzzles provided. Patient will verbalize skills used during activity. Patient will verbalize how skills can be applied post d/c.   Behavioral Response: Did not attend  [name], LRT/CTRS  [name] 11/15/2012 4:48 PM

## 2012-11-15 NOTE — Progress Notes (Signed)
The focus of this group is to educate the patient on the purpose and policies of crisis stabilization and provide a format to answer questions about their admission.  The group details unit policies and expectations of patients while admitted. Patient did not attend because she had a scheduled telephone interview with [facility] at this time

## 2012-11-16 DIAGNOSIS — F3130 Bipolar disorder, current episode depressed, mild or moderate severity, unspecified: Principal | ICD-10-CM

## 2012-11-16 MED ORDER — AMLODIPINE BESYLATE 5 MG PO TABS: 5.0000 mg | ORAL_TABLET | Freq: Every morning | ORAL | Status: DC

## 2012-11-16 MED ORDER — QUETIAPINE FUMARATE 400 MG PO TABS: 400.0000 mg | ORAL_TABLET | Freq: Every day | ORAL | Status: DC

## 2012-11-16 MED ORDER — PRAVASTATIN SODIUM 20 MG PO TABS: 20.0000 mg | ORAL_TABLET | Freq: Every evening | ORAL | Status: DC

## 2012-11-16 MED ORDER — METOPROLOL TARTRATE 50 MG PO TABS: 50.0000 mg | ORAL_TABLET | Freq: Two times a day (BID) | ORAL | Status: DC

## 2012-11-16 MED ORDER — TRAZODONE HCL 100 MG PO TABS: 100.0000 mg | ORAL_TABLET | Freq: Every day | ORAL | Status: DC

## 2012-11-16 MED ORDER — HYDROXYZINE HCL 25 MG PO TABS: 25.0000 mg | ORAL_TABLET | Freq: Four times a day (QID) | ORAL | Status: DC | PRN

## 2012-11-16 MED ORDER — GABAPENTIN 100 MG PO CAPS: 200.0000 mg | ORAL_CAPSULE | Freq: Two times a day (BID) | ORAL | Status: DC

## 2012-11-16 MED ORDER — METFORMIN HCL 500 MG PO TABS: 500.0000 mg | ORAL_TABLET | Freq: Two times a day (BID) | ORAL | Status: DC

## 2012-11-16 MED ORDER — DIVALPROEX SODIUM ER 500 MG PO TB24: ORAL_TABLET | ORAL | Status: DC

## 2012-11-16 NOTE — Progress Notes (Signed)
[facility] MD Progress Note  11/16/2012 2:51 PM JALAIYA OYSTER  MRN:  [id number] Subjective:  Lynn Palmer is trying to get everything in place to get her life back together.  She is planning to go to a half way house tomorrow. She is going to come to the CD IOP. She states she is committed to her recovery. 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   HENT: Negative.   Eyes: Negative.   Respiratory: Negative.   Cardiovascular: Negative.   Gastrointestinal: Negative.   Genitourinary: Negative.   Musculoskeletal: Negative.   Skin: Negative.   Neurological: Negative.   Endo/Heme/Allergies: Negative.   Psychiatric/Behavioral: Positive for depression and substance abuse. The patient is nervous/anxious.     Blood pressure 112/75, pulse 76, temperature 98.1 F (36.7 C), temperature source Oral, resp. rate 16, height 5' (1.524 m), weight 77.565 kg (171 lb), last menstrual period 10/21/2012, SpO2 100.00%.Body mass index is 33.4 kg/(m^2).  General Appearance: Fairly Groomed  Patent attorney::  Fair  Speech:  Clear and Coherent  Volume:  Normal  Mood:  worried  Affect:  Appropriate  Thought Process:  Coherent and Goal Directed  Orientation:  Full (Time, Place, and Person)  Thought Content:  worries, concerns  Suicidal Thoughts:  No  Homicidal Thoughts:  No  Memory:  Immediate;   Fair Recent;   Fair Remote;   Fair  Judgement:  Fair  Insight:  Present  Psychomotor Activity:  Restlessness  Concentration:  Fair  Recall:  Fair  Akathisia:  No  Handed:  Right  AIMS (if indicated):     Assets:  Desire for  Improvement  Sleep:  Number of Hours: 6.25   Current Medications: Current Facility-Administered Medications  Medication Dose Route Frequency Provider Last Rate Last Dose  . acetaminophen (TYLENOL) tablet 650 mg  650 mg Oral Q6H PRN Earney Navy, NP   650 mg at 11/16/12 0531  . alum & mag hydroxide-simeth (MAALOX/MYLANTA) 200-200-20 MG/5ML suspension 30 mL  30 mL Oral Q4H PRN Earney Navy, NP      . amLODipine (NORVASC) tablet 5 mg  5 mg Oral Daily Earney Navy, NP   5 mg at 11/16/12 0837  . divalproex (DEPAKOTE ER) 24 hr tablet 1,000 mg  1,000 mg Oral QHS Rachael Fee, MD   1,000 mg at 11/15/12 2251  . divalproex (DEPAKOTE ER) 24 hr tablet 500 mg  500 mg Oral Daily Rachael Fee, MD   500 mg at 11/16/12 0837  . gabapentin (NEURONTIN) capsule 200 mg  200 mg Oral BID Earney Navy, NP   200 mg at 11/16/12 0837  . hydrOXYzine (ATARAX/VISTARIL) tablet 25 mg  25 mg Oral Q6H PRN Rachael Fee, MD   25 mg at 11/16/12 0840  . magnesium hydroxide (MILK OF MAGNESIA) suspension 30 mL  30 mL Oral Daily PRN Earney Navy, NP      . metFORMIN (GLUCOPHAGE) tablet 500 mg  500 mg Oral BID WC Earney Navy, NP   500 mg at 11/16/12 4098  . metoprolol (LOPRESSOR) tablet 50 mg  50 mg Oral BID Earney Navy, NP   50 mg at 11/16/12 0838  . multivitamin with minerals tablet 1 tablet  1 tablet Oral Daily Earney Navy, NP   1 tablet at 11/16/12 0837  . QUEtiapine (SEROQUEL) tablet 400 mg  400 mg Oral QHS Earney Navy, NP   400 mg at 11/15/12 2251  . simvastatin (ZOCOR) tablet 5 mg  5 mg Oral q1800 Earney Navy, NP   5 mg at 11/15/12 1809  . thiamine (B-1) injection 100 mg  100 mg Intramuscular Once Earney Navy, NP      . thiamine (VITAMIN B-1) tablet 100 mg  100 mg Oral Daily Earney Navy, NP   100 mg at 11/16/12 0837  . traZODone (DESYREL) tablet 100 mg  100 mg Oral QHS Earney Navy, NP   100 mg at 11/15/12 2251    Lab Results: No results  found for this or any previous visit (from the past 48 hour(s)).  Physical Findings: AIMS: Facial and Oral Movements Muscles of Facial Expression: None, normal Lips and Perioral Area: None, normal Jaw: None, normal Tongue: None, normal,Extremity Movements Upper (arms, wrists, hands, fingers): None, normal Lower (legs, knees, ankles, toes): None, normal, Trunk Movements Neck, shoulders, hips: None, normal, Overall Severity Severity of abnormal movements (highest score from questions above): None, normal Incapacitation due to abnormal movements: None, normal Patient's awareness of abnormal movements (rate only patient's report): No Awareness, Dental Status Current problems with teeth and/or dentures?: No Does patient usually wear dentures?: No  CIWA:  CIWA-Ar Total: 0 COWS:     Treatment Plan Summary: Daily contact with patient to assess and evaluate symptoms and progress in treatment Medication management  Plan: Supportive approach/coping skills/relapse prevention            Will optimize treatment with the psychotropics  Medical Decision Making Problem Points:  Review of psycho-social stressors (1) Data Points:  Review of new medications or change in dosage (2)  I certify that inpatient services furnished can reasonably be expected to improve the patient's condition.   Pancho Rushing A 11/16/2012, 2:51 PM

## 2012-11-16 NOTE — Progress Notes (Signed)
Patient ID: Lynn Palmer, female   DOB: [birth year], 51 y.o.   MRN: [id number] She has been up and to groups interacting with peers and staff. She was anxious this morning. She was able to get the phone numbers she wanted and she has been calm. Self inventory this AM. Depression and hopeless 3, w/ds agitation,hopelessness,tremors and chilling. SI on and off. She has not voiced any of these symptoms to me nor has she requested a prn.

## 2012-11-16 NOTE — [facility] Group Notes (Signed)
[facility] LCSW Group Therapy  11/16/2012 2:50 PM  Type of Therapy:  Group Therapy  Participation Level:  Did Not Attend   Smart, Lashika Erker 11/16/2012, 2:50 PM

## 2012-11-16 NOTE — [facility] Group Notes (Signed)
[facility] LCSW Aftercare Discharge Planning Group Note   11/16/2012 9:34 AM  Participation Quality:  Appropriate   Mood/Affect:  Anxious  Depression Rating:  3  Anxiety Rating:  3  Thoughts of Suicide:  No Will you contract for safety?   NA  Current AVH:  No  Plan for Discharge/Comments:  Pt plans to move into [facility] tomorrow upon d/c and will be picked up by friend. [facility]. Dewayne Hatch will visit with pt today. Pt requested to get into locker for phone numbers to people that can help her move her belongings today before previous house she had been living in disposes of her belongings. Pt very upset about this and CSW asked Lupita Leash RN to assist pt with getting these numbers out of her phone in locker.   Transportation Means: friend   Supports: Financial trader, some friends  Counselling psychologist, Research scientist (physical sciences)

## 2012-11-16 NOTE — Discharge Summary (Signed)
Physician Discharge Summary Note  Patient:  Lynn Palmer is an 52 y.o., female MRN:  [id number] DOB:  01-28-1961 Patient phone:  [phone] (home)  Patient address:   [address] [place] [address],   Date of Admission:  11/11/2012 Date of Discharge: 11/17/12  Reason for Admission:  Alcohol intoxication  Discharge Diagnoses: Principal Problem:   Alcohol dependence Active Problems:   Alcoholism /alcohol abuse   Polysubstance abuse   Bipolar disorder  Review of Systems  Constitutional: Negative.        Obese  HENT: Negative.   Eyes: Negative.   Respiratory: Negative.   Cardiovascular: Negative.   Gastrointestinal: Negative.   Genitourinary: Negative.   Musculoskeletal: Negative.   Skin: Negative.   Neurological: Negative.   Endo/Heme/Allergies: Negative.   Psychiatric/Behavioral: Positive for depression (Stabilized with medication prior to discharge) and substance abuse (Alcoholism, polysubstance dependence). Negative for suicidal ideas, hallucinations and memory loss. The patient is nervous/anxious (Stabilized with medication prior to discharge) and has insomnia (Stabilized with medication prior to discharge).    Axis Diagnosis:   AXIS I:  Bipolar depression, Polysubstance dependence, Alcohol dependence AXIS II:  Deferred AXIS III:   Past Medical History  Diagnosis Date  . Bipolar 1 disorder   . Obesity   . Diabetes mellitus   . Hyperlipidemia   . Hypertension   . Vomiting   . Depression   . Anxiety   . Substance abuse     alcohol    AXIS IV:  economic problems, housing problems, occupational problems and Polysubstance abuse/dependence AXIS V:  63  Level of Care:  OP  Hospital Course:  Patient has been suffering with symptoms of depression anxiety and substance abuse. She started drinking again 2 weeks ago, 4-40 oz of beer daily. Wednesday she drank too much and blacked out and her friend brought her to the ED after she woke up and started breaking  things. She was at [facility] in June and then went to [facility]. [name] has also been using marijuana daily. Reports overdosing on her Seroquel last week to end her life, never called anyone because she had wanted to die. Currently, denies suicidal/homicidal ideations and auditory/visual hallucinations. She is experiencing withdrawal symptoms and Librium protocol started. Depakote level was extremely high, Depakote not ordered at this time. Stressors increase her drinking, structure decreases her alcohol use.  Upon admission into this hospital, and after admission assessment/evaluation coupled with UDS/Toxicology reports, it was determined that Ms. Porta will need detoxification treatment protocol to stabilize her system of drug/alcohol intoxications and to combat the withdrawal symptoms of these substances as well.  [name] was then started on Librium treatment protocol. She was also enrolled in group counseling sessions and activities where she was taught, counseled and learned coping skills that should help her after discharge to cope better and manage her substance abuse issues to sustain a much longer sobriety. She also attended AA/NA meetings being offered and held on this unit. She has some previously existing and or identifiable medical conditions that required treatment and or monitoring. She received medication management for all those health issues as well. She was monitored closely for any potential problems that may arise as a result of and or during detoxification treatment. Patient tolerated her treatment regimen and detoxification treatment protocol without any significant adverse effects and or reactions presented. Patient also came in with high levels of Depakote levels, however,  her Depakote doses were held until subsequent Depakote levels tests showed decreased levels  and her Depakote doses were resumed prior to discharge. Upon her discharge, her Depakote levels was 40.4  Patient attended  treatment team meeting this am and met with the treatment team members. Her reason for admission, present symptoms, substance abuse issues, response to treatment and discharge plans discussed. Patient endorsed that she is doing well and stable for discharge to pursue the next phase of her substance abuse treatment. It was then agreed upon that she will follow-up care at the [facility] behavioral health hospital outpatient CDIOP. The address, date, time and contact information for this clinic/appointment provided for patient in writing.  Besides the detoxification treatment, patient also was ordered and received; Gabapentin 100 mg tid for anxiety, Hydroxyzine 25 mg prn for anxiety, Seroquel 400 mg Q bedtime for sleep and Trazodone 100 mg Q bedtime for sleep. Although with the treatments received here and scheduled outpatient psychiatric services, patient was also encouraged to join/attend AA/NA meetings offered and held within her community. Upon discharge, patient adamantly denies suicidal, homicidal ideations, auditory, visual hallucinations, delusional thougts and or withdrawal symptoms. Patient left [facility] with all personal belongings in no apparent distress. She received 4 days worth supply samples of her discharge medications provided by [facility] pharmacy. Transportation per friend.   Consults:  psychiatry  Significant Diagnostic Studies:  labs: CBC with diff, CMP, UDS, Toxicology tests, U/A, Depakote levels.  Discharge Vitals:   Blood pressure 116/85, pulse 83, temperature 97.4 F (36.3 C), temperature source Oral, resp. rate 18, height 5' (1.524 m), weight 77.565 kg (171 lb), last menstrual period 10/21/2012, SpO2 100.00%. Body mass index is 33.4 kg/(m^2). Lab Results:   Results for orders placed during the hospital encounter of 11/11/12 (from the past 72 hour(s))  VALPROIC ACID LEVEL     Status: Abnormal   Collection Time    11/16/12  7:56 PM      Result Value Range   Valproic Acid Lvl 40.4 (*) 50.0 -  100.0 ug/mL   Comment: Performed at [facility]    Physical Findings: AIMS: Facial and Oral Movements Muscles of Facial Expression: None, normal Lips and Perioral Area: None, normal Jaw: None, normal Tongue: None, normal,Extremity Movements Upper (arms, wrists, hands, fingers): None, normal Lower (legs, knees, ankles, toes): None, normal, Trunk Movements Neck, shoulders, hips: None, normal, Overall Severity Severity of abnormal movements (highest score from questions above): None, normal Incapacitation due to abnormal movements: None, normal Patient's awareness of abnormal movements (rate only patient's report): No Awareness, Dental Status Current problems with teeth and/or dentures?: No Does patient usually wear dentures?: No  CIWA:  CIWA-Ar Total: 0 COWS:     Psychiatric Specialty Exam: See Psychiatric Specialty Exam and Suicide Risk Assessment completed by Attending Physician prior to discharge.  Discharge destination:  Home  Is patient on multiple antipsychotic therapies at discharge:  No   Has Patient had three or more failed trials of antipsychotic monotherapy by history:  No  Recommended Plan for Multiple Antipsychotic Therapies: NA     Medication List    STOP taking these medications       norethindrone-ethinyl estradiol 0.5/0.75/1-35 MG-MCG tablet  Commonly known as:  NORTREL 7/7/7      TAKE these medications     Indication   amLODipine 5 MG tablet  Commonly known as:  NORVASC  Take 1 tablet (5 mg total) by mouth every morning. For hypertension   Indication:  High Blood Pressure     divalproex 500 MG 24 hr tablet  Commonly known as:  DEPAKOTE ER  Take 1 tablet (500 mg) in am and 2 tablets (1000 mg) Q bedtime: For mood stabilization   Indication:  Mood instabilization     gabapentin 100 MG capsule  Commonly known as:  NEURONTIN  Take 2 capsules (200 mg total) by mouth 2 (two) times daily. For anxiety/pain management   Indication:  Pain, Anxiety      hydrOXYzine 25 MG tablet  Commonly known as:  ATARAX/VISTARIL  Take 1 tablet (25 mg total) by mouth 4 (four) times daily as needed for anxiety.   Indication:  Anxiety associated with Organic Disease     metFORMIN 500 MG tablet  Commonly known as:  GLUCOPHAGE  Take 1 tablet (500 mg total) by mouth 2 (two) times daily. For diabetes control   Indication:  Type 2 Diabetes     metoprolol 50 MG tablet  Commonly known as:  LOPRESSOR  Take 1 tablet (50 mg total) by mouth 2 (two) times daily. For high blood pressure control   Indication:  High Blood Pressure     pravastatin 20 MG tablet  Commonly known as:  PRAVACHOL  Take 1 tablet (20 mg total) by mouth every evening. For control of high cholesterol   Indication:  Type II A Hyperlipidemia, Type II B Hyperlipidemia, Increased Fats, Triglycerides & Cholesterol in the Blood     QUEtiapine 400 MG tablet  Commonly known as:  SEROQUEL  Take 1 tablet (400 mg total) by mouth at bedtime. For mood control   Indication:  Mood control     traZODone 100 MG tablet  Commonly known as:  DESYREL  Take 1 tablet (100 mg total) by mouth at bedtime. Sleep   Indication:  Trouble Sleeping       Follow-up Information   Follow up with [facility] On 11/21/2012. (Arrive by 11AM for orientation with Dewayne Hatch.)    Contact information:   [address] [place], [place] [address] Phone: [phone] Fax: [phone]     Follow-up recommendations:  Activity:  As tolerated Diet: As recommended by your primary care doctor. Keep all scheduled follow-up appointments as recommended. Continue to work your relapse prevention plan Comments:  Take all your medications as prescribed by your mental healthcare provider. Report any adverse effects and or reactions from your medicines to your outpatient provider promptly. Patient is instructed and cautioned to not engage in alcohol and or illegal drug use while on prescription medicines. In the event of  worsening symptoms, patient is instructed to call the crisis hotline, 911 and or go to the nearest ED for appropriate evaluation and treatment of symptoms. Follow-up with your primary care provider for your other medical issues, concerns and or health care needs.   Total Discharge Time:  Greater than 30 minutes.  Signed: Sanjuana Kava, PMHNP-BC 11/17/2012, 2:03 PM Agree with assessment and plan Reymundo Poll. Dub Mikes, M.D.

## 2012-11-16 NOTE — Progress Notes (Addendum)
Adult Psychoeducational Group Note  Date:  11/16/2012 Time:  6:59 PM  Group Topic/Focus:  Personal Choices and Values:   The focus of this group is to help patients assess and explore the importance of values in their lives, how their values affect their decisions, how they express their values and what opposes their expression.  Participation Level:  Active  Participation Quality:  Appropriate and Attentive  Affect:  Appropriate  Cognitive:  Alert and Appropriate  Insight: Appropriate and Good  Engagement in Group:  Engaged  Modes of Intervention:  Discussion, Education and Support  Additional Comments:  Pt identified important values for her included her relationship with God and her grand-children. She realizes she has been a poor example for them. "I want to be a positive role model".   Reynolds Bowl 11/16/2012, 6:59 PM

## 2012-11-17 DIAGNOSIS — F1910 Other psychoactive substance abuse, uncomplicated: Secondary | ICD-10-CM

## 2012-11-17 NOTE — Progress Notes (Signed)
[facility] Adult Case Management Discharge Plan :  Will you be returning to the same living situation after discharge: No. Pt accepted into [facility] At discharge, do you have transportation home?:Yes,  friend Do you have the ability to pay for your medications:Yes,  private insurance  Release of information consent forms completed and in the chart;  Patient's signature needed at discharge.  Patient to Follow up at: Follow-up Information   Follow up with [facility] On 11/21/2012. (Arrive by 11AM for orientation with Dewayne Hatch.)    Contact information:   [address] [place], [place] [address] Phone: [phone] Fax: [phone]      Patient denies SI/HI:   Yes,  in group/self report    Safety Planning and Suicide Prevention discussed:  Yes,  SPE completed with pt as she did not consent to family contact.  Smart, Wyeth Hoffer 11/17/2012, 8:36 AM

## 2012-11-17 NOTE — Progress Notes (Signed)
D: Pt is anxious in affect and mood. Pt relates her anxiety to her upcoming discharge on Thursday. She is anxious over the unknown of residing at a new place and meeting new people. To prevent confirmation she plans on bringing a friend to help move her boxes from her old residence. Pt was positive for group attendance this evening. Pt observed interacting appropriately within the milieu. Pt reports and presents with mild tremors. Pt advised to speak with the doctor prior to discharge.   A: Writer administered scheduled and prn medications to pt. Continued support and availability as needed was extended to this pt. Staff continue to monitor pt with q20min checks.  R: No adverse drug reactions noted. Pt receptive to treatment. Pt remains safe at this time.

## 2012-11-17 NOTE — [facility] Suicide Risk Assessment (Signed)
Suicide Risk Assessment  Discharge Assessment     Demographic Factors:  Caucasian and Unemployed  Mental Status Per Nursing Assessment::   On Admission:  NA  Current Mental Status by Physician: In full contact with reality. She denies suicidal ideas, plans or intent. Plans to go to an [facility] She is also coming to the CD IOP. She is committed to abstinence. She is going to abstain from being involved in relationships at this time   Loss Factors: Loss of significant relationship  Historical Factors: NA  Risk Reduction Factors:   Living with another person, especially a relative and Positive social support  Continued Clinical Symptoms:  Bipolar Disorder:   Depressive phase Alcohol/Substance Abuse/Dependencies  Cognitive Features That Contribute To Risk:  Closed-mindedness Polarized thinking Thought constriction (tunnel vision)    Suicide Risk:  Minimal: No identifiable suicidal ideation.  Patients presenting with no risk factors but with morbid ruminations; may be classified as minimal risk based on the severity of the depressive symptoms  Discharge Diagnoses:   AXIS I:  Alcohol Dependence, Bipolar Depresssion AXIS II:  Deferred AXIS III:   Past Medical History  Diagnosis Date  . Bipolar 1 disorder   . Obesity   . Diabetes mellitus   . Hyperlipidemia   . Hypertension   . Vomiting   . Depression   . Anxiety   . Substance abuse     alcohol    AXIS IV:  other psychosocial or environmental problems AXIS V:  61-70 mild symptoms  Plan Of Care/Follow-up recommendations:  Activity:  as tolerated Diet:  regular Follow up CD IOP Is patient on multiple antipsychotic therapies at discharge:  No   Has Patient had three or more failed trials of antipsychotic monotherapy by history:  No  Recommended Plan for Multiple Antipsychotic Therapies: N/A   Michiel Sivley A 11/17/2012, 8:26 AM

## 2012-11-17 NOTE — Progress Notes (Signed)
Patient ID: Lynn Palmer, female   DOB: 08-07-1960, 53 y.o.   MRN: [id number] She has been discharged and was picked up by a friend. She voiced  Understanding of discharge teaching on medication , fall risk safety plan ( copy of fall safety instructions given to her) and follow up plan. She denies thought of harming self.

## 2012-11-17 NOTE — [facility] Suicide Risk Assessment (Signed)
[facility] INPATIENT:  Family/Significant Other Suicide Prevention Education  Suicide Prevention Education:  Patient Refusal for Family/Significant Other Suicide Prevention Education: The patient Lynn Palmer has refused to provide written consent for family/significant other to be provided Family/Significant Other Suicide Prevention Education during admission and/or prior to discharge.  Physician notified.   SPE completed with pt. Pt given SPI pamphlet and encouraged to ask questions and talk about concerns.   Smart, Dmani Mizer 11/17/2012, 8:36 AM

## 2012-11-21 ENCOUNTER — Encounter (HOSPITAL_COMMUNITY): Payer: Self-pay | Admitting: Psychology

## 2012-11-21 ENCOUNTER — Other Ambulatory Visit (HOSPITAL_COMMUNITY): Payer: Medicare Other | Attending: Psychiatry | Admitting: Psychology

## 2012-11-21 DIAGNOSIS — F319 Bipolar disorder, unspecified: Secondary | ICD-10-CM

## 2012-11-21 DIAGNOSIS — F1020 Alcohol dependence, uncomplicated: Secondary | ICD-10-CM

## 2012-11-21 NOTE — Progress Notes (Signed)
Patient Discharge Instructions:  Next Level Care Provider Has Access to the EMR, 11/21/12 Records provided to [facility] via [facility]/Epic access.  Jerelene Redden, 11/21/2012, 3:23 PM

## 2012-11-22 ENCOUNTER — Encounter (HOSPITAL_COMMUNITY): Payer: Self-pay | Admitting: Psychology

## 2012-11-22 NOTE — Progress Notes (Signed)
    Daily Group Progress Note  Program: CD-IOP   Group Time: 1-2:30 pm  Participation Level: Active  Behavioral Response: Appropriate and Sharing  Type of Therapy: Process Group  Topic: Group Process/Pro's and Con's: the first half of this session was spent in process. Members checked-in and shared what they had done over the weekend. Two members had relapsed and the members shared about the issues that had led them to use. The group discussed possible options that may have avoided the relapse. Later on in this half of group, a psycho-ed presentation was provided asking members to identify the Pro's and Con's of addiction versus sobriety. An interesting discussion ensued, but at the end of the discussion, members agreed that the benefits outweighed the negatives.   Group Time: 2:45- 4pm  Participation Level: Active  Behavioral Response: Sharing  Type of Therapy: Psycho-education Group  Topic: Feelings: Why they are so Important. The second half of group was spent in a psycho-ed session on the topic of feelings. Members were provided a handout that identified why feelings are so important. Members were open about the struggles with feelings and how difficult they are in early recovery. They were asked to identify what they were feeling, but many still appear frustrated or uncertain whey they are feeling as they do. This will be an ongoing process they will deal with in early recovery.   Summary: The patient was new to the group and introduced herself. She disclosed that she had just gotten out of detox and has been upstairs many times before. The patient reported she has been in and out of recovery and the rooms since she was 52 yo. She shared that she attends the 8 am meeting at the [employer], every morning. "I like to start my day with a meeting", she explained to her new group members. The patient shared about moving into the women's [facility] and the issues she is  having there. She provided good feedback during the discussion and appeared very comfortable in the session. Her sobriety date is 7/31.    Family Program: Family present? No   Name of family member(s):   UDS collected: No Results:   AA/NA attended?: YesFriday, Saturday, and Sunday  Sponsor?: Yes   Rayni Nemitz, LCAS

## 2012-11-23 ENCOUNTER — Other Ambulatory Visit (HOSPITAL_COMMUNITY): Payer: Medicare Other | Admitting: Psychology

## 2012-11-23 DIAGNOSIS — F1020 Alcohol dependence, uncomplicated: Secondary | ICD-10-CM

## 2012-11-25 ENCOUNTER — Other Ambulatory Visit (HOSPITAL_COMMUNITY): Payer: Medicare Other | Admitting: Psychology

## 2012-11-25 DIAGNOSIS — F1020 Alcohol dependence, uncomplicated: Secondary | ICD-10-CM

## 2012-11-27 ENCOUNTER — Encounter (HOSPITAL_COMMUNITY): Payer: Self-pay | Admitting: Psychology
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
## 2012-11-28 ENCOUNTER — Other Ambulatory Visit (HOSPITAL_COMMUNITY): Payer: Medicare Other | Admitting: Psychology

## 2012-11-28 ENCOUNTER — Encounter (HOSPITAL_COMMUNITY): Payer: Self-pay | Admitting: Psychology

## 2012-11-28 DIAGNOSIS — F1020 Alcohol dependence, uncomplicated: Secondary | ICD-10-CM

## 2012-11-28 NOTE — Progress Notes (Signed)
    Daily Group Progress Note  Program: CD-IOP   Group Time: 1-2:30 pm  Participation Level: Minimal  Behavioral Response: Sharing  Type of Therapy: Process Group  Topic: Group Process: the first part of group today was spent in process. Members shared about what they are currently dealing with. I announced one member had left a message informing me she was entering residential treatment at [facility] [place]. There was also a new group member present. Members seemed somewhat reserved and were, finally, instructed to talk about what they had been doing. During this part of group, the medical director was meeting two of the members that had started treatment earlier this week as well as a member who was discharging today.   Group Time: 2:45- 4pm  Participation Level: Minimal  Behavioral Response: Sharing  Type of Therapy: Psycho-education Group  Topic: "What are you willing to change?" The second part of group was focused on the addressing the 'conditioning' that addiction develops from and how members can change it. They were asked what they would be willing to do differently. While some easily offered the changes they have made to date, others seemed resistant to identifying any changes they would commit to. As the session neared the end, a graduation ceremony was held with members honoring the graduating member with kind and hopeful words offered him.   Summary: The patient reported she has been busy with meetings and her requirements at the [facility], but she stated she is doing well. She laughed as she admitted that the roommate she hadn't liked at first is now her best friend. The patient reported she had attended a really good women's meeting and after disclosing that her sponsor was a man, she received many phone numbers from the women in attendance. The patient's disclosures tend to wander and be excessive and today I asked her to get to the point on  numerous occasions. The patient has poor insight and seems stuck in her dysfunctional mentality.   Family Program: Family present? No   Name of family member(s):   UDS collected: No Results:  AA/NA attended?: YesWednesday, Thursday and Friday  Sponsor?: Yes   Margaretann Abate, LCAS

## 2012-11-29 ENCOUNTER — Other Ambulatory Visit (HOSPITAL_COMMUNITY): Payer: Self-pay | Admitting: *Deleted

## 2012-11-29 ENCOUNTER — Telehealth (HOSPITAL_COMMUNITY): Payer: Self-pay

## 2012-11-29 ENCOUNTER — Encounter (HOSPITAL_COMMUNITY): Payer: Self-pay | Admitting: Psychology

## 2012-11-29 MED ORDER — QUETIAPINE FUMARATE 400 MG PO TABS: 400.0000 mg | ORAL_TABLET | Freq: Every day | ORAL | Status: DC
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
## 2012-11-30 ENCOUNTER — Encounter (HOSPITAL_COMMUNITY): Payer: Medicare Other | Admitting: Psychiatry

## 2012-11-30 ENCOUNTER — Other Ambulatory Visit (HOSPITAL_COMMUNITY): Payer: Medicare Other | Admitting: Psychology

## 2012-11-30 DIAGNOSIS — F1020 Alcohol dependence, uncomplicated: Secondary | ICD-10-CM | POA: Diagnosis not present

## 2012-11-30 DIAGNOSIS — F319 Bipolar disorder, unspecified: Secondary | ICD-10-CM

## 2012-11-30 NOTE — Progress Notes (Incomplete)
Patient ID: Lynn Palmer, female   DOB: 1960-09-24, 52 y.o.   MRN: [id number] Orientation to CD-IOP:
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
## 2012-12-01 ENCOUNTER — Ambulatory Visit (HOSPITAL_COMMUNITY): Payer: Self-pay | Admitting: Psychiatry

## 2012-12-01 ENCOUNTER — Other Ambulatory Visit (HOSPITAL_COMMUNITY): Payer: Self-pay | Admitting: Psychiatry

## 2012-12-01 DIAGNOSIS — F319 Bipolar disorder, unspecified: Secondary | ICD-10-CM

## 2012-12-01 MED ORDER — DIVALPROEX SODIUM ER 500 MG PO TB24: ORAL_TABLET | ORAL | Status: DC

## 2012-12-01 MED ORDER — METFORMIN HCL 500 MG PO TABS: 500.0000 mg | ORAL_TABLET | Freq: Two times a day (BID) | ORAL | Status: DC

## 2012-12-01 MED ORDER — AMLODIPINE BESYLATE 5 MG PO TABS: 5.0000 mg | ORAL_TABLET | Freq: Every morning | ORAL | Status: DC

## 2012-12-01 MED ORDER — PRAVASTATIN SODIUM 20 MG PO TABS: 20.0000 mg | ORAL_TABLET | Freq: Every evening | ORAL | Status: DC

## 2012-12-01 MED ORDER — METOPROLOL TARTRATE 50 MG PO TABS: 50.0000 mg | ORAL_TABLET | Freq: Two times a day (BID) | ORAL | Status: DC

## 2012-12-01 MED ORDER — QUETIAPINE FUMARATE 400 MG PO TABS: 400.0000 mg | ORAL_TABLET | Freq: Every day | ORAL | Status: DC

## 2012-12-01 MED ORDER — GABAPENTIN 100 MG PO CAPS: 300.0000 mg | ORAL_CAPSULE | Freq: Two times a day (BID) | ORAL | Status: DC

## 2012-12-01 MED ORDER — HYDROXYZINE HCL 25 MG PO TABS: 50.0000 mg | ORAL_TABLET | Freq: Three times a day (TID) | ORAL | Status: DC

## 2012-12-01 MED ORDER — TRAZODONE HCL 100 MG PO TABS: 100.0000 mg | ORAL_TABLET | Freq: Every day | ORAL | Status: DC
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
## 2012-12-02 ENCOUNTER — Other Ambulatory Visit (HOSPITAL_COMMUNITY): Payer: Medicare Other

## 2012-12-04 ENCOUNTER — Emergency Department (HOSPITAL_COMMUNITY)
Admission: EM | Admit: 2012-12-04 | Discharge: 2012-12-05 | Disposition: A | Discharge status: Other Acute Inpatient Hospital | Payer: Medicare Other | Attending: Emergency Medicine | Admitting: Emergency Medicine

## 2012-12-04 ENCOUNTER — Encounter (HOSPITAL_COMMUNITY): Payer: Self-pay | Admitting: Emergency Medicine

## 2012-12-04 ENCOUNTER — Encounter (HOSPITAL_COMMUNITY): Payer: Self-pay | Admitting: Psychology

## 2012-12-04 DIAGNOSIS — Z9884 Bariatric surgery status: Secondary | ICD-10-CM | POA: Insufficient documentation

## 2012-12-04 DIAGNOSIS — F411 Generalized anxiety disorder: Secondary | ICD-10-CM | POA: Insufficient documentation

## 2012-12-04 DIAGNOSIS — Z8639 Personal history of other endocrine, nutritional and metabolic disease: Secondary | ICD-10-CM | POA: Insufficient documentation

## 2012-12-04 DIAGNOSIS — F1010 Alcohol abuse, uncomplicated: Secondary | ICD-10-CM | POA: Insufficient documentation

## 2012-12-04 DIAGNOSIS — F319 Bipolar disorder, unspecified: Secondary | ICD-10-CM | POA: Insufficient documentation

## 2012-12-04 DIAGNOSIS — I10 Essential (primary) hypertension: Secondary | ICD-10-CM | POA: Insufficient documentation

## 2012-12-04 DIAGNOSIS — F3289 Other specified depressive episodes: Secondary | ICD-10-CM | POA: Insufficient documentation

## 2012-12-04 DIAGNOSIS — Z87891 Personal history of nicotine dependence: Secondary | ICD-10-CM | POA: Insufficient documentation

## 2012-12-04 DIAGNOSIS — E785 Hyperlipidemia, unspecified: Secondary | ICD-10-CM | POA: Insufficient documentation

## 2012-12-04 DIAGNOSIS — Z79899 Other long term (current) drug therapy: Secondary | ICD-10-CM | POA: Insufficient documentation

## 2012-12-04 DIAGNOSIS — Z862 Personal history of diseases of the blood and blood-forming organs and certain disorders involving the immune mechanism: Secondary | ICD-10-CM | POA: Insufficient documentation

## 2012-12-04 DIAGNOSIS — E119 Type 2 diabetes mellitus without complications: Secondary | ICD-10-CM | POA: Insufficient documentation

## 2012-12-04 DIAGNOSIS — F329 Major depressive disorder, single episode, unspecified: Secondary | ICD-10-CM | POA: Insufficient documentation

## 2012-12-04 DIAGNOSIS — R45851 Suicidal ideations: Secondary | ICD-10-CM | POA: Insufficient documentation

## 2012-12-04 LAB — COMPREHENSIVE METABOLIC PANEL
ALT: 6 U/L (ref 0–35)
BUN: 19 mg/dL (ref 6–23)
CO2: 29 mEq/L (ref 19–32)
Calcium: 9.2 mg/dL (ref 8.4–10.5)
Creatinine, Ser: 1.31 mg/dL — ABNORMAL HIGH (ref 0.50–1.10)
GFR calc Af Amer: 53 mL/min — ABNORMAL LOW (ref >90)
GFR calc non Af Amer: 46 mL/min — ABNORMAL LOW (ref >90)
Glucose, Bld: 103 mg/dL — ABNORMAL HIGH (ref 70–99)
Sodium: 135 mEq/L (ref 135–145)
Total Protein: 6.6 g/dL (ref 6.0–8.3)

## 2012-12-04 LAB — CBC
HCT: 38.6 % (ref 36.0–46.0)
Hemoglobin: 12.8 g/dL (ref 12.0–15.0)
MCH: 29.4 pg (ref 26.0–34.0)
MCHC: 33.2 g/dL (ref 30.0–36.0)
MCV: 88.7 fL (ref 78.0–100.0)
RBC: 4.35 MIL/uL (ref 3.87–5.11)

## 2012-12-04 LAB — GLUCOSE, CAPILLARY
Glucose-Capillary: 55 mg/dL — ABNORMAL LOW (ref 70–99)
Glucose-Capillary: 76 mg/dL (ref 70–99)

## 2012-12-04 LAB — RAPID URINE DRUG SCREEN, HOSP PERFORMED
Barbiturates: NOT DETECTED
Benzodiazepines: POSITIVE — AB
Cocaine: NOT DETECTED
Opiates: NOT DETECTED

## 2012-12-04 LAB — ETHANOL: Alcohol, Ethyl (B): <11 mg/dL (ref 0–11)

## 2012-12-04 MED ORDER — ACETAMINOPHEN 325 MG PO TABS: 650.0000 mg | ORAL_TABLET | ORAL | Status: DC | PRN

## 2012-12-04 MED ORDER — NICOTINE 21 MG/24HR TD PT24
21.0000 mg | MEDICATED_PATCH | Freq: Every day | TRANSDERMAL | Status: DC
Start: 2012-12-04 — End: 2012-12-06
  Filled 2012-12-04: qty 1

## 2012-12-04 MED ORDER — DIVALPROEX SODIUM ER 500 MG PO TB24
500.0000 mg | ORAL_TABLET | Freq: Every day | ORAL | Status: DC
  Administered 2012-12-05: 500 mg via ORAL
  Filled 2012-12-04: qty 1

## 2012-12-04 MED ORDER — GABAPENTIN 300 MG PO CAPS: 300.0000 mg | ORAL_CAPSULE | Freq: Two times a day (BID) | ORAL | Status: DC

## 2012-12-04 MED ORDER — AMLODIPINE BESYLATE 5 MG PO TABS
5.0000 mg | ORAL_TABLET | Freq: Every morning | ORAL | Status: DC
  Administered 2012-12-05: 5 mg via ORAL
  Filled 2012-12-04: qty 1

## 2012-12-04 MED ORDER — HYDROXYZINE HCL 25 MG PO TABS: 50.0000 mg | ORAL_TABLET | Freq: Three times a day (TID) | ORAL | Status: DC

## 2012-12-04 MED ORDER — TRAZODONE HCL 100 MG PO TABS
100.0000 mg | ORAL_TABLET | Freq: Every day | ORAL | Status: DC
  Administered 2012-12-04: 100 mg via ORAL
  Filled 2012-12-04: qty 1

## 2012-12-04 MED ORDER — ONDANSETRON HCL 4 MG PO TABS: 4.0000 mg | ORAL_TABLET | Freq: Three times a day (TID) | ORAL | Status: DC | PRN

## 2012-12-04 MED ORDER — DIVALPROEX SODIUM ER 500 MG PO TB24
1000.0000 mg | ORAL_TABLET | Freq: Every day | ORAL | Status: DC
  Administered 2012-12-04 – 2012-12-05 (×2): 1000 mg via ORAL
  Filled 2012-12-04 (×3): qty 2

## 2012-12-04 MED ORDER — METOPROLOL TARTRATE 25 MG PO TABS
50.0000 mg | ORAL_TABLET | Freq: Two times a day (BID) | ORAL | Status: DC
  Administered 2012-12-04 – 2012-12-05 (×3): 50 mg via ORAL
  Filled 2012-12-04: qty 1
  Filled 2012-12-04: qty 2
  Filled 2012-12-04: qty 1
  Filled 2012-12-04: qty 2

## 2012-12-04 MED ORDER — ALUM & MAG HYDROXIDE-SIMETH 200-200-20 MG/5ML PO SUSP: 30.0000 mL | ORAL | Status: DC | PRN

## 2012-12-04 MED ORDER — ZOLPIDEM TARTRATE 5 MG PO TABS
5.0000 mg | ORAL_TABLET | Freq: Every evening | ORAL | Status: DC | PRN
  Administered 2012-12-04: 5 mg via ORAL
  Filled 2012-12-04: qty 1

## 2012-12-04 MED ORDER — HYDROXYZINE HCL 25 MG PO TABS
25.0000 mg | ORAL_TABLET | Freq: Three times a day (TID) | ORAL | Status: DC
  Administered 2012-12-04 – 2012-12-05 (×4): 25 mg via ORAL
  Filled 2012-12-04 (×4): qty 1

## 2012-12-04 MED ORDER — SIMVASTATIN 10 MG PO TABS
10.0000 mg | ORAL_TABLET | Freq: Every day | ORAL | Status: DC
  Administered 2012-12-04 – 2012-12-05 (×2): 10 mg via ORAL
  Filled 2012-12-04 (×3): qty 1

## 2012-12-04 MED ORDER — QUETIAPINE FUMARATE 300 MG PO TABS
400.0000 mg | ORAL_TABLET | Freq: Every day | ORAL | Status: DC
  Administered 2012-12-04 – 2012-12-05 (×2): 400 mg via ORAL
  Filled 2012-12-04 (×2): qty 1

## 2012-12-04 MED ORDER — GABAPENTIN 300 MG PO CAPS
300.0000 mg | ORAL_CAPSULE | Freq: Two times a day (BID) | ORAL | Status: DC
  Administered 2012-12-04 – 2012-12-05 (×3): 300 mg via ORAL
  Filled 2012-12-04 (×4): qty 1

## 2012-12-04 MED ORDER — IBUPROFEN 200 MG PO TABS
600.0000 mg | ORAL_TABLET | Freq: Three times a day (TID) | ORAL | Status: DC | PRN
  Administered 2012-12-05: 600 mg via ORAL
  Filled 2012-12-04: qty 3

## 2012-12-04 MED ORDER — METFORMIN HCL 500 MG PO TABS
500.0000 mg | ORAL_TABLET | Freq: Two times a day (BID) | ORAL | Status: DC
  Filled 2012-12-04 (×4): qty 1

## 2012-12-04 NOTE — ED Notes (Addendum)
Lynn Palmer crackers/soda given (pt has not eaten today)

## 2012-12-04 NOTE — Progress Notes (Signed)
    Daily Group Progress Note  Program: CD-IOP   Group Time: 1-2:30 pm  Participation Level: Minimal  Behavioral Response: Sharing  Type of Therapy: Psycho-education Group  Topic: Stages of Change: After completing a check-in, a psycho-ed piece was presented on "The Stages of Change'. Members were provided a handout that identified the stages of change. A brief explanation of the stages was provided and the importance of moving back and forth as well as the time frame of change emphasized. Members were invited to identify where they felt they were in the stages of change. They were also encouraged to identify what they would do if they fell out or relapsed and how they return to the stage they needed to be. The session proved very effective and members responded well to this concept of change as a process.   Group Time: 2:45-4pm  Participation Level: Active  Behavioral Response: Appropriate and Sharing  Type of Therapy: Process Group  Topic: Group Process: The second part of group was spent in process. Members shared about current issues and concerns. There was good feedback and discussion and members seemed open and willing to discuss what they were struggling with at this time. The session ended with a tension-breaker and an opportunity to learn more about each other and the unknown strengths, talents, and experiences each of the group member brings to the table. This session provided good insight, encouraged openness that proved very safe, and there was good feedback and validation among group members.  Summary: The patient reported she was doing well. She identified herself as being in the 'action' phase of change and feels good about herself. She reminded the group that this is the first time she has moved into an [name] house and she is being held accountable in ways she has not been before. In process, that patient reported she has to go by her daughter's house to get some money she  is going to give her. She is worried about it because her daughter is such a trigger. She asked the group what she should do? The group made a few recommendations and all included that she not go alone, but take someone else with her. She agreed she would contact one of her roommates' and see if she will drive her there. The group encouraged this member not to stay long, but just get the money and leave. Her sobriety date remains 7/31. She made some good comments.    Family Program: Family present? No   Name of family member(s):   UDS collected: No Results:   AA/NA attended?: YesTuesday and Wednesday  Sponsor?: Yes   Lynn Palmer, LCAS

## 2012-12-04 NOTE — BH Assessment (Signed)
Tele Assessment Note   Lynn Palmer is an 52 y.o. female. She presented to the ED at [facility] stating she needed an UDS for the [facility] she was currently enrolled in for SA treatment. Patient reports that they were requiring her to have an UDS because she slept late that day. She reports she watched a movie late with her roommate last night and slept until 11 AM this morning. She reports that normally she is awake around 6 AM. She reports she has been "doing everything right-volunteering and going to SA-IOP." She then states that her drug screen is positive for benzodiazepines and that she doesn't know how this occurred; but she believes it may have something to do with her lap band. She denies any other substances at this time. She reports she has been taking her medications as prescribed, though she reports she was only taking one seroquel at night, but then went back to two seroquel per dosage. She then stated she will take the bottle of seroquel she just had filled and kill herself. "I will overdose; I take it so I can go to sleep. I have a key to my house and a bed, I'll just go there and fall asleep"  Patient will not contract for safety. Patient then stated that she went to her daughter's home on Wednesday and her daughter gave her $80 for food. She denies getting anything else from her daughter, but volunteers information that her daughter abuses etoh and drugs as well. She has no other support system, stating that her boyfriend is in jail. She does not have a sponsor; but knows a man named Cletis, who runs "recovery houses" and she could possibly go to one of those houses since she cannot return to the [employer]. Patient denies HI; no delusions or hallucinations noted. Her eye contact is poor and she rambles on at times about how she will harm herself. She also stated up until this point, no one has drug screened her in the SA-IOP program, though I am not sure why she brings this up  during her assessment.  Axis I: Bipolar, Depressed by history; Polysubstance abuse by history, Axis II: Deferred Axis III: see medical history Axis IV: Significant stressors, including housing due to non-compliance with treatment requirements; poor coping mechanisms; poor social support system; lack of appropriate sponsor for recovery Axis V: GAF 25  Past Medical History:  Past Medical History  Diagnosis Date  . Bipolar 1 disorder   . Obesity   . Diabetes mellitus   . Hyperlipidemia   . Hypertension   . Vomiting   . Depression   . Anxiety   . Substance abuse     alcohol     Past Surgical History  Procedure Laterality Date  . Tonsillectomy    . Laparoscopic gastric banding  06/11/09  . Vaginal delivery  1987    Family History:  Family History  Problem Relation Age of Onset  . Osteoarthritis Mother   . Heart failure Father   . Osteoarthritis Father   . Alcohol abuse Father     Social History:  reports that she has quit smoking. She has never used smokeless tobacco. She reports that  drinks alcohol. She reports that she uses illicit drugs (Marijuana).  Additional Social History:     CIWA: CIWA-Ar BP: 140/79 mmHg Pulse Rate: 73 COWS:    Allergies: No Known Allergies  Home Medications:  (Not in a hospital admission)  OB/GYN Status:  Patient's  last menstrual period was 10/21/2012.  General Assessment Data Location of Assessment: [facility] Assessment Services ACT Assessment: Yes Is this a Tele or Face-to-Face Assessment?: Tele Assessment Is this an Initial Assessment or a Re-assessment for this encounter?: Initial Assessment Living Arrangements: Other (Comment) Can pt return to current living arrangement?:  (no; patient d/c from [facility]) Admission Status: Voluntary Is patient capable of signing voluntary admission?: Yes Transfer from: Acute Hospital Referral Source: MD     [facility] Crisis Care Plan Living Arrangements: Other (Comment) Name of Psychiatrist:   (Dr. Lucianne Muss)  Education Status Is patient currently in school?: No  Risk to self Suicidal Ideation: Yes-Currently Present Suicidal Intent: Yes-Currently Present Is patient at risk for suicide?: Yes Suicidal Plan?: Yes-Currently Present Specify Current Suicidal Plan:  (overdose on seroquel) Access to Means: Yes Specify Access to Suicidal Means: just refilled seroquel per pt report What has been your use of drugs/alcohol within the last 12 months?:  (chronic abuse in the past) Previous Attempts/Gestures: Yes How many times?:  (admitted to one during assessment-overdose) Triggers for Past Attempts: Other (Comment) (SA and poor choices) Intentional Self Injurious Behavior: None Family Suicide History: Unknown Recent stressful life event(s): Conflict (Comment);Other (Comment) (just discharged from [facility]) Persecutory voices/beliefs?: No Depression: Yes Depression Symptoms: Tearfulness Substance abuse history and/or treatment for substance abuse?: Yes Suicide prevention information given to non-admitted patients: Not applicable  Risk to Others Homicidal Ideation: No Thoughts of Harm to Others: No Current Homicidal Intent: No Current Homicidal Plan: No Access to Homicidal Means: No History of harm to others?: No Assessment of Violence: None Noted Does patient have access to weapons?: No Criminal Charges Pending?: No Does patient have a court date: No  Psychosis Hallucinations: None noted Delusions: None noted  Mental Status Report Eye Contact: Poor Motor Activity: Freedom of movement;Restlessness Speech: Logical/coherent Level of Consciousness: Alert Mood: Depressed;Despair Affect: Anxious Anxiety Level: Minimal Thought Processes: Coherent Judgement: Unimpaired Orientation: Person;Place;Time;Situation;Appropriate for developmental age Obsessive Compulsive Thoughts/Behaviors: None  Cognitive Functioning Concentration: Decreased Memory: Recent Intact IQ:  Average Insight: Fair Impulse Control: Poor Appetite: Fair Sleep: No Change Total Hours of Sleep:  (8) Vegetative Symptoms: None  ADLScreening [facility] Assessment Services) Patient's cognitive ability adequate to safely complete daily activities?: Yes Patient able to express need for assistance with ADLs?: Yes Independently performs ADLs?: Yes (appropriate for developmental age)  Prior Inpatient Therapy Prior Inpatient Therapy: Yes Prior Therapy Dates:  (11/2012) Prior Therapy Facilty/Provider(s):  [facility]) Reason for Treatment:  (SI, Overdose, SA)  Prior Outpatient Therapy Prior Outpatient Therapy: Yes Prior Therapy Dates:  (current) Prior Therapy Facilty/Provider(s):  [facility]) Reason for Treatment:  (SA_IOP)  ADL Screening (condition at time of admission) Patient's cognitive ability adequate to safely complete daily activities?: Yes Patient able to express need for assistance with ADLs?: Yes Independently performs ADLs?: Yes (appropriate for developmental age)  Home Assistive Devices/Equipment Home Assistive Devices/Equipment: None    Abuse/Neglect Assessment (Assessment to be complete while patient is alone) Physical Abuse: Denies Verbal Abuse: Denies Sexual Abuse: Denies Values / Beliefs Cultural Requests During Hospitalization: None Spiritual Requests During Hospitalization: None        Additional Information 1:1 In Past 12 Months?: No CIRT Risk: No Elopement Risk: No Does patient have medical clearance?: Yes     Disposition:  Disposition Initial Assessment Completed for this Encounter: Yes Disposition of Patient: Inpatient treatment program Type of inpatient treatment program: Adult  Mardene Celeste 12/04/2012 4:43 PM

## 2012-12-04 NOTE — ED Notes (Signed)
Pt states that she needs to have a drug screen in order to go back to [facility].  Pt's friend brought her in and told registration that if her UDS doesn't come back clean she is going to kill herself.

## 2012-12-04 NOTE — BH Assessment (Signed)
[facility] Assessment Progress Note    Staffed with Dr. Dub Mikes; he has agreed to admit patient, pending a bed becoming available on the [address] hall. Contacted Bobby at [employer] and relayed information concerning patient's pending admission.   Shon Baton, MSW, LCSW, LCASA, CSWG

## 2012-12-04 NOTE — ED Notes (Signed)
Has finished supper

## 2012-12-04 NOTE — ED Provider Notes (Signed)
CSN: [id number]     Arrival date & time 12/04/12  1331 History     First MD Initiated Contact with Patient 12/04/12 1416     Chief Complaint  Patient presents with  . Drug Screen    . Medical Clearance   (Consider location/radiation/quality/duration/timing/severity/associated sxs/prior Treatment) HPI Comments: Patient is a 52 year old female who presents today with suicidal ideations after a positive drug screen. She reports that she was discharged from [facility] last week for alcohol detox. She is currently living at the [facility]. She reports that she has been having drug screens that are positive for benzodiazepines despite the fact that she has not been taking them. She wonders if this is due to her lap band surgery. She will be kicked out of the [facility] if she keeps having positive drug screens. She believes that she is really being kicked out because 3 of the girls there don't like her personality. She expresses suicidal ideations saying, "I will take all of my Seroquel because I know that will kill me". She does have a history of prior suicide attempts. She denies any other drug or alcohol use.    The history is provided by the patient. No language interpreter was used.    Past Medical History  Diagnosis Date  . Bipolar 1 disorder   . Obesity   . Diabetes mellitus   . Hyperlipidemia   . Hypertension   . Vomiting   . Depression   . Anxiety   . Substance abuse     alcohol    Past Surgical History  Procedure Laterality Date  . Tonsillectomy    . Laparoscopic gastric banding  06/11/09  . Vaginal delivery  1987   Family History  Problem Relation Age of Onset  . Osteoarthritis Mother   . Heart failure Father   . Osteoarthritis Father   . Alcohol abuse Father    History  Substance Use Topics  . Smoking status: Former Games developer  . Smokeless tobacco: Never Used  . Alcohol Use: 0.0 oz/week     Comment: social   OB History   Grav Para Term  Preterm Abortions TAB SAB Ect Mult Living                 Review of Systems  Constitutional: Negative for fever and chills.  Respiratory: Negative for shortness of breath.   Cardiovascular: Negative for chest pain.  Psychiatric/Behavioral: Positive for suicidal ideas.  All other systems reviewed and are negative.    Allergies  Review of patient's allergies indicates no known allergies.  Home Medications   Current Outpatient Rx  Name  Route  Sig  Dispense  Refill  . acetaminophen (TYLENOL) 500 MG tablet   Oral   Take 1,000 mg by mouth every 6 (six) hours as needed for pain.         Marland Kitchen amLODipine (NORVASC) 5 MG tablet   Oral   Take 1 tablet (5 mg total) by mouth every morning. For hypertension   30 tablet   0   . divalproex (DEPAKOTE ER) 500 MG 24 hr tablet   Oral   Take 500-1,000 mg by mouth 2 (two) times daily. Take 1 tablet (500 mg) in am and 2 tablets (1000 mg) Q bedtime: For mood stabilization         . gabapentin (NEURONTIN) 100 MG capsule   Oral   Take 300 mg by mouth 2 (two) times daily. For anxiety/pain management         .  hydrOXYzine (ATARAX/VISTARIL) 25 MG tablet   Oral   Take 25 mg by mouth 3 (three) times daily.         Marland Kitchen ibuprofen (ADVIL,MOTRIN) 200 MG tablet   Oral   Take 800 mg by mouth every 6 (six) hours as needed for pain.         . metFORMIN (GLUCOPHAGE) 500 MG tablet   Oral   Take 1 tablet (500 mg total) by mouth 2 (two) times daily. For diabetes control   60 tablet   0   . metoprolol (LOPRESSOR) 50 MG tablet   Oral   Take 1 tablet (50 mg total) by mouth 2 (two) times daily. For high blood pressure control   60 tablet   0   . QUEtiapine (SEROQUEL) 400 MG tablet   Oral   Take 1 tablet (400 mg total) by mouth at bedtime. For mood control   30 tablet   0   . simvastatin (ZOCOR) 10 MG tablet   Oral   Take 10 mg by mouth at bedtime.         . traZODone (DESYREL) 100 MG tablet   Oral   Take 1 tablet (100 mg total) by mouth  at bedtime. Sleep   30 tablet   0    BP 140/79  Pulse 73  Temp(Src) 98.7 F (37.1 C) (Oral)  Resp 20  SpO2 98%  LMP 10/21/2012 Physical Exam  Nursing note and vitals reviewed. Constitutional: She is oriented to person, place, and time. She appears well-developed and well-nourished. No distress.  HENT:  Head: Normocephalic and atraumatic.  Right Ear: External ear normal.  Left Ear: External ear normal.  Nose: Nose normal.  Mouth/Throat: Oropharynx is clear and moist.  Eyes: Conjunctivae are normal.  Neck: Normal range of motion.  Cardiovascular: Normal rate, regular rhythm and normal heart sounds.   Pulmonary/Chest: Effort normal and breath sounds normal. No stridor. No respiratory distress. She has no wheezes. She has no rales.  Abdominal: Soft. She exhibits no distension.  Musculoskeletal: Normal range of motion.  Neurological: She is alert and oriented to person, place, and time. She has normal strength.  Skin: Skin is warm and dry. She is not diaphoretic. No erythema.  Psychiatric: She expresses impulsivity. She exhibits a depressed mood. She expresses suicidal ideation. She expresses suicidal plans.  Tearful     ED Course   Procedures (including critical care time)  Labs Reviewed  COMPREHENSIVE METABOLIC PANEL - Abnormal; Notable for the following:    Glucose, Bld 103 (*)    Creatinine, Ser 1.31 (*)    Albumin 3.1 (*)    GFR calc non Af Amer 46 (*)    GFR calc Af Amer 53 (*)    All other components within normal limits  URINE RAPID DRUG SCREEN (HOSP PERFORMED) - Abnormal; Notable for the following:    Benzodiazepines POSITIVE (*)    All other components within normal limits  GLUCOSE, CAPILLARY - Abnormal; Notable for the following:    Glucose-Capillary 64 (*)    All other components within normal limits  GLUCOSE, CAPILLARY - Abnormal; Notable for the following:    Glucose-Capillary 55 (*)    All other components within normal limits  CBC  ETHANOL  GLUCOSE,  CAPILLARY  GLUCOSE, CAPILLARY  GLUCOSE, CAPILLARY  GLUCOSE, CAPILLARY   No results found. No diagnosis found.  MDM  Patient with SI and plan with prior attempts. She is medically clear. ACT team was consulted. Will get telepsych.  Mora Bellman, PA-C 12/05/12 1402

## 2012-12-05 ENCOUNTER — Other Ambulatory Visit (HOSPITAL_COMMUNITY): Payer: Medicare Other

## 2012-12-05 LAB — GLUCOSE, CAPILLARY: Glucose-Capillary: 93 mg/dL (ref 70–99)

## 2012-12-05 NOTE — ED Notes (Signed)
Only medication not given was trazadone 100 mg. No acute distress noted.

## 2012-12-05 NOTE — BHH Counselor (Addendum)
Per Minerva Areola, RN-AC, pt is accepted to Dr. Dub Mikes, bed #301-1.Denice Bors, AADC 12/05/2012 9:29 PM

## 2012-12-05 NOTE — ED Notes (Signed)
Accepting nurse Donavan Foil, RN stated that patient can get medication when they arrive to [facility] unit.

## 2012-12-05 NOTE — Progress Notes (Signed)
Pt accepted to [facility] pending 300 hall bed by Dr. Dub Mikes.   Frutoso Schatz [phone]  ED CSW 12/05/2012 1339pm

## 2012-12-06 ENCOUNTER — Inpatient Hospital Stay (HOSPITAL_COMMUNITY)
Admission: AD | Admit: 2012-12-06 | Discharge: 2012-12-09 | DRG: 885 | Disposition: A | Discharge status: Home / Self Care | Payer: 59 | Source: Intra-hospital | Attending: Psychiatry | Admitting: Psychiatry

## 2012-12-06 ENCOUNTER — Encounter (HOSPITAL_COMMUNITY): Payer: Self-pay

## 2012-12-06 DIAGNOSIS — F1910 Other psychoactive substance abuse, uncomplicated: Secondary | ICD-10-CM | POA: Diagnosis present

## 2012-12-06 DIAGNOSIS — I10 Essential (primary) hypertension: Secondary | ICD-10-CM | POA: Diagnosis present

## 2012-12-06 DIAGNOSIS — Z79899 Other long term (current) drug therapy: Secondary | ICD-10-CM

## 2012-12-06 DIAGNOSIS — F411 Generalized anxiety disorder: Secondary | ICD-10-CM | POA: Diagnosis present

## 2012-12-06 DIAGNOSIS — F319 Bipolar disorder, unspecified: Secondary | ICD-10-CM | POA: Diagnosis present

## 2012-12-06 DIAGNOSIS — F3130 Bipolar disorder, current episode depressed, mild or moderate severity, unspecified: Principal | ICD-10-CM | POA: Diagnosis present

## 2012-12-06 DIAGNOSIS — F1020 Alcohol dependence, uncomplicated: Secondary | ICD-10-CM | POA: Diagnosis present

## 2012-12-06 DIAGNOSIS — E119 Type 2 diabetes mellitus without complications: Secondary | ICD-10-CM | POA: Diagnosis present

## 2012-12-06 LAB — GLUCOSE, CAPILLARY
Glucose-Capillary: 113 mg/dL — ABNORMAL HIGH (ref 70–99)
Glucose-Capillary: 97 mg/dL (ref 70–99)
Glucose-Capillary: 130 mg/dL — ABNORMAL HIGH (ref 70–99)

## 2012-12-06 LAB — VALPROIC ACID LEVEL: Valproic Acid Lvl: 94.4 ug/mL (ref 50.0–100.0)

## 2012-12-06 LAB — HEMOGLOBIN A1C
Hgb A1c MFr Bld: 5.7 % — ABNORMAL HIGH (ref <5.7)
Mean Plasma Glucose: 117 mg/dL — ABNORMAL HIGH (ref <117)

## 2012-12-06 MED ORDER — HYDROXYZINE HCL 25 MG PO TABS
25.0000 mg | ORAL_TABLET | Freq: Four times a day (QID) | ORAL | Status: DC | PRN
  Administered 2012-12-06: 25 mg via ORAL

## 2012-12-06 MED ORDER — INSULIN ASPART 100 UNIT/ML ~~LOC~~ SOLN: 0.0000 [IU] | Freq: Every day | SUBCUTANEOUS | Status: DC

## 2012-12-06 MED ORDER — SIMVASTATIN 10 MG PO TABS
10.0000 mg | ORAL_TABLET | Freq: Every day | ORAL | Status: DC
  Administered 2012-12-06 – 2012-12-08 (×3): 10 mg via ORAL
  Filled 2012-12-06 (×2): qty 1
  Filled 2012-12-06: qty 4
  Filled 2012-12-06 (×3): qty 1

## 2012-12-06 MED ORDER — MAGNESIUM HYDROXIDE 400 MG/5ML PO SUSP: 30.0000 mL | Freq: Every day | ORAL | Status: DC | PRN

## 2012-12-06 MED ORDER — IBUPROFEN 600 MG PO TABS
600.0000 mg | ORAL_TABLET | Freq: Four times a day (QID) | ORAL | Status: DC | PRN
  Administered 2012-12-07: 600 mg via ORAL
  Filled 2012-12-06: qty 1

## 2012-12-06 MED ORDER — METFORMIN HCL 500 MG PO TABS
500.0000 mg | ORAL_TABLET | Freq: Two times a day (BID) | ORAL | Status: DC
  Administered 2012-12-06 – 2012-12-09 (×7): 500 mg via ORAL
  Filled 2012-12-06: qty 1
  Filled 2012-12-06: qty 8
  Filled 2012-12-06 (×8): qty 1
  Filled 2012-12-06: qty 8
  Filled 2012-12-06: qty 1

## 2012-12-06 MED ORDER — DIVALPROEX SODIUM ER 500 MG PO TB24
500.0000 mg | ORAL_TABLET | Freq: Every day | ORAL | Status: DC
  Administered 2012-12-06 – 2012-12-09 (×4): 500 mg via ORAL
  Filled 2012-12-06: qty 1
  Filled 2012-12-06: qty 12
  Filled 2012-12-06 (×5): qty 1

## 2012-12-06 MED ORDER — QUETIAPINE FUMARATE 400 MG PO TABS
400.0000 mg | ORAL_TABLET | Freq: Every day | ORAL | Status: DC
  Administered 2012-12-06 – 2012-12-08 (×3): 400 mg via ORAL
  Filled 2012-12-06 (×5): qty 1
  Filled 2012-12-06: qty 4

## 2012-12-06 MED ORDER — ACETAMINOPHEN 500 MG PO TABS: 1000.0000 mg | ORAL_TABLET | Freq: Four times a day (QID) | ORAL | Status: DC | PRN

## 2012-12-06 MED ORDER — INSULIN ASPART 100 UNIT/ML ~~LOC~~ SOLN: 0.0000 [IU] | Freq: Three times a day (TID) | SUBCUTANEOUS | Status: DC

## 2012-12-06 MED ORDER — ALUM & MAG HYDROXIDE-SIMETH 200-200-20 MG/5ML PO SUSP: 30.0000 mL | ORAL | Status: DC | PRN

## 2012-12-06 MED ORDER — DIVALPROEX SODIUM ER 500 MG PO TB24
1000.0000 mg | ORAL_TABLET | Freq: Every day | ORAL | Status: DC
  Administered 2012-12-06 – 2012-12-08 (×3): 1000 mg via ORAL
  Filled 2012-12-06 (×5): qty 2

## 2012-12-06 MED ORDER — AMLODIPINE BESYLATE 5 MG PO TABS
5.0000 mg | ORAL_TABLET | Freq: Every morning | ORAL | Status: DC
  Administered 2012-12-06 – 2012-12-08 (×3): 5 mg via ORAL
  Filled 2012-12-06 (×4): qty 1

## 2012-12-06 MED ORDER — METOPROLOL TARTRATE 50 MG PO TABS
50.0000 mg | ORAL_TABLET | Freq: Two times a day (BID) | ORAL | Status: DC
  Administered 2012-12-06 – 2012-12-09 (×7): 50 mg via ORAL
  Filled 2012-12-06 (×2): qty 1
  Filled 2012-12-06: qty 8
  Filled 2012-12-06 (×2): qty 1
  Filled 2012-12-06: qty 8
  Filled 2012-12-06 (×6): qty 1
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
## 2012-12-06 NOTE — Progress Notes (Signed)
Pt. Was recently discharged from [facility] to [facility].  Pt. Has been at [facility] for 2 weeks and reports that she tested positive for benzos and they kicked her out.  Pt. Is very angry and continues to state that she has not taken any benzos.  Pt. States prior to her last admit to [facility] a female friend was giving her all the klonipin she wanted but has not had any drugs other than her prescribed meds or ETOH since the last admit.  Pt. Has an extensive medical history.  She has arthritis in her knees and has some difficulty walking but does not use an assistive device.  Pt. Is also a non insulin dependent diabetic.  Pt. Reports passive SI no plan and contracts for safety.  Poor support system.  Pt. Reports her boyfriend is in jail and her Daughter is an alcoholic. Pt. Was offered food and fluids and escorted to the 300 hall.

## 2012-12-06 NOTE — BHH Counselor (Signed)
Adult Psychosocial Assessment Update Interdisciplinary Team  Previous [facility] Hospital admissions/discharges:  Admissions Discharges  Date: 12/06/12 Date: unknown  Date: 11/11/12 Date: 11/17/12  Date: 09/24/12 Date: 09/28/12  Date: 08/04/12 Date: 08/09/12  Date: Date:   Changes since the last Psychosocial Assessment (including adherence to outpatient mental health and/or substance abuse treatment, situational issues contributing to decompensation and/or relapse). SRINIDHI LANDERS is an 52 y.o. female. She presented to the ED at [facility] stating she needed an UDS for the [facility] she was currently enrolled in for SA treatment. Patient reports that they were requiring her to have an UDS because she slept late that day. She reports she watched a movie late with her roommate last night and slept until 11 AM this morning. She reports that normally she is awake around 6 AM. She reports she has been "doing everything right-volunteering and going to [facility]." She then states that her drug screen is positive for benzodiazepines and that she doesn't know how this occurred; but she believes it may have something to do with her lap band. She denies any other substances at this time. She reports she has been taking her medications as prescribed, though she reports she was only taking one seroquel at night, but then went back to two seroquel per dosage. She then stated she will take the bottle of seroquel she just had filled and kill herself. "I will overdose; I take it so I can go to sleep. I have a key to my house and a bed, I'll just go there and fall asleep"             Discharge Plan 1. Will you be returning to the same living situation after discharge?   Yes: No:      If no, what is your plan?    No. Pt reports that she has a friend who is going to help her get into a different transitional home/[facility].        2. Would you like a referral for services when you are discharged? Yes:      If yes, for what services?  No:       Pt plans to continue with [facility] at [facility].       Summary and Recommendations (to be completed by the evaluator) Pt is 52 year old female admitted to [facility] for SI and depression. Pt was recently discharged from [facility] and was living at [facility] and following up with [facility] CDIOP program. Pt reports that she did not use drugs although her drug test was positive for benzos, which caused her to be kicked out of [facility]. Recommendations for pt include: crisis stabilization, medication management for mood stabilization, and comprehensive mental wellness/sobriety plan. Pt plans to follow up with [facility] and is working with a friend to figure out living arrangements upon d/c.                        Signature:  Trula Slade, 12/06/2012 10:53 AM

## 2012-12-06 NOTE — BHH Group Notes (Signed)
BHH LCSW Group Therapy  12/06/2012  1:15 PM   Type of Therapy:  Group Therapy  Participation Level:  Did Not Attend  Twilla Khouri Horton, LCSWA 12/06/2012 1:46 PM  

## 2012-12-06 NOTE — Progress Notes (Signed)
D: Patient denies HI and A/V hallucinations and reports on and off thoughts of SI; patient reports sleep is fair; reports appetite is improving ; reports energy level is low ; reports ability to pay attention is poor; rates depression as 2/10; rates hopelessness 2/10;   A: Monitored q 15 minutes; patient encouraged to attend groups; patient educated about medications; patient given medications per physician orders; patient encouraged to express feelings and/or concerns  R: Patient is frustrated about the positive drug screen and is very conscious about medications and that she does not want to have another positive reading; patient's interaction with staff and peers is appropriate; patient was able to set goal to talk with staff 1:1 when having feelings of SI; patient is taking medications as prescribed and tolerating medications; patient is attending all groups and engaging

## 2012-12-06 NOTE — Progress Notes (Signed)
The focus of this group is to educate the patient on the purpose and policies of crisis stabilization and provide a format to answer questions about their admission.  The group details unit policies and expectations of patients while admitted. Patient was engaging and very involved in the discussion.

## 2012-12-06 NOTE — Progress Notes (Signed)
Recreation Therapy Notes  Date: 08.26.2014 Time: 2:30pm Location: 300 Hall Dayroom  Group Topic: Animal Assisted Activities  Goal Area(s) Addresses:  Patient will interact appropriately with dog team.    Behavioral Response: Did not attend  [name], LRT/CTRS  [name] 12/06/2012 4:46 PM

## 2012-12-06 NOTE — Progress Notes (Signed)
Recreation Therapy Notes  Date: 08.26.2014 Time: 3:00pm Location: 300 Hall Dayroom  Group Topic: Communication, Team Building, Problem Solving  Goal Area(s) Addresses:  Patient will be able to recognize use of communication, team building and problem solving during course of group activity.  Patient will verbalize qualities used to make decisions during group session.  Patient will verbalize ability to use skills to build healthy support system post d/c.   Behavioral Response: Appropriate  Intervention: Problem Solving Activity.  Activity: Life Boat. Patients were given a scenario about being caught on a sinking ship. In this scenario patients were informed all members of group session, in addition to 8 individuals off of a list of 15 could fit on the life boat. List of individuals included people from all socioeconomic status, for example: President Obama, Midwife, Runner, broadcasting/film/video, Education officer, museum.    Education: LIfe Skills, Discharge Planning   Education Outcome: Acknowledges understanding  Clinical Observations/Feedback: Patient arrived to group session at approximately 3:10pm. Upon arrival patient engaged in activity. Patient actively participated in group activity, giving justification for individuals she wanted to put on life boat, as well as debating with peer appropriately. Patient made no contributions to wrap up discussion and it is unclear if patient actively listened, as she was observed to close her eyes and rest her head on the wall next to her.     Marykay Lex Gerrick Ray, LRT/CTRS  Thomas Rhude L 12/06/2012 4:25 PM
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
## 2012-12-06 NOTE — Tx Team (Signed)
Initial Interdisciplinary Treatment Plan  PATIENT STRENGTHS: (choose at least two) Active sense of humor Average or above average intelligence Capable of independent living  PATIENT STRESSORS: Financial difficulties Health problems Marital or family conflict Substance abuse   PROBLEM LIST: Problem List/Patient Goals Date to be addressed Date deferred Reason deferred Estimated date of resolution  Decreased anxiety and depression      Help pt. Find long term treatment/ denies substance abuse but tested + for benzos at [facility] CRITERIA:  Improved stabilization in mood, thinking, and/or behavior Motivation to continue treatment in a less acute level of care Need for constant or close observation no longer present Reduction of life-threatening or endangering symptoms to within safe limits  PRELIMINARY DISCHARGE PLAN: Attend PHP/IOP Attend 12-step recovery group Outpatient therapy Placement in alternative living arrangements  PATIENT/FAMIILY INVOLVEMENT: This treatment plan has been presented to and reviewed with the patient, Lynn Palmer, and/or family member, .  The patient and family have been given the opportunity to ask questions and make suggestions.  Cooper Render 12/06/2012, 1:31 AM

## 2012-12-07 ENCOUNTER — Other Ambulatory Visit (HOSPITAL_COMMUNITY): Payer: Medicare Other

## 2012-12-07 LAB — GLUCOSE, CAPILLARY
Glucose-Capillary: 81 mg/dL (ref 70–99)
Glucose-Capillary: 75 mg/dL (ref 70–99)
Glucose-Capillary: 82 mg/dL (ref 70–99)
Glucose-Capillary: 94 mg/dL (ref 70–99)

## 2012-12-07 MED ORDER — GABAPENTIN 100 MG PO CAPS
200.0000 mg | ORAL_CAPSULE | Freq: Two times a day (BID) | ORAL | Status: DC
  Administered 2012-12-08 – 2012-12-09 (×3): 200 mg via ORAL
  Filled 2012-12-07: qty 16
  Filled 2012-12-07: qty 2
  Filled 2012-12-07: qty 16
  Filled 2012-12-07 (×4): qty 2

## 2012-12-07 MED ORDER — TRAZODONE HCL 100 MG PO TABS
100.0000 mg | ORAL_TABLET | Freq: Every day | ORAL | Status: DC
  Administered 2012-12-07 – 2012-12-08 (×2): 100 mg via ORAL
  Filled 2012-12-07: qty 1
  Filled 2012-12-07: qty 4
  Filled 2012-12-07 (×2): qty 1

## 2012-12-07 MED ORDER — GABAPENTIN 100 MG PO CAPS
200.0000 mg | ORAL_CAPSULE | Freq: Once | ORAL | Status: AC
  Administered 2012-12-07: 200 mg via ORAL
  Filled 2012-12-07: qty 2

## 2012-12-07 MED ORDER — GABAPENTIN 100 MG PO CAPS: 200.0000 mg | ORAL_CAPSULE | Freq: Two times a day (BID) | ORAL | Status: DC

## 2012-12-07 NOTE — Progress Notes (Signed)
Patient ID: Lynn Palmer, female   DOB: [birth year], 52 y.o.   MRN: [id number] D: pt. Sitting in dayroom journaling, no interaction noted. Pt. Reports depression at "2" of 10. "I'm leaving on Friday" Pt. Reports anxiety and concern about going to a new recovery house.  Pt. Also express family issues saying "somebody told my mom something, which it ain't their business" Pt. Would not elaborate any further. A: Writer introduced self to client and reviewed med admin times. Staff will monitor q25min for safety. Writer provided emotional support. Staff encouraged group. R: Pt. Is safe on the unit. Pt. Attended group.

## 2012-12-07 NOTE — Progress Notes (Signed)
[facility] MD Progress Note  12/07/2012 3:47 PM [name]  MRN:  [id number] Subjective:  Lynn Palmer is looking at getting into another half way house. She did not sleep well last night states she did not have all the medications she usually takes at night. She is committed to abstinence. She needs to explain to the next place she goes why the UDS is still positive for Benzos Diagnosis:   DSM5: Schizophrenia Disorders:   Obsessive-Compulsive Disorders:   Trauma-Stressor Disorders:   Substance/Addictive Disorders:  Alcohol Related Disorder - Severe (303.90)  Depressive Disorders:    Axis I: Bipolar, Depressed  ADL's:  Intact  Sleep: Poor  Appetite:  Fair  Suicidal Ideation:  Plan:  denies Intent:  denies Means:  denies Homicidal Ideation:  Plan:  denies Intent:  denies Means:  denies AEB (as evidenced by):  Psychiatric Specialty Exam: Review of Systems  Constitutional: Negative.   Eyes: Negative.   Respiratory: Negative.   Cardiovascular: Negative.   Gastrointestinal: Negative.   Genitourinary: Negative.   Musculoskeletal: Positive for back pain.  Skin: Negative.   Neurological: Negative.   Endo/Heme/Allergies: Negative.   Psychiatric/Behavioral: Positive for substance abuse. The patient is nervous/anxious and has insomnia.     Blood pressure 139/67, pulse 67, temperature 98.6 F (37 C), temperature source Oral, resp. rate 16, last menstrual period 11/21/2012.There is no weight on file to calculate BMI.  General Appearance: Fairly Groomed  Patent attorney::  Fair  Speech:  Clear and Coherent  Volume:  Normal  Mood:  Anxious and worried  Affect:  worried  Thought Process:  Coherent and Goal Directed  Orientation:  Full (Time, Place, and Person)  Thought Content:  worries, concerns  Suicidal Thoughts:  No  Homicidal Thoughts:  No  Memory:  Immediate;   Fair Recent;   Fair Remote;   Fair  Judgement:  Fair  Insight:  Present  Psychomotor Activity:  Normal   Concentration:  Fair  Recall:  Fair  Akathisia:  No  Handed:  Right  AIMS (if indicated):     Assets:  Desire for Improvement  Sleep:  Number of Hours: 5.75   Current Medications: Current Facility-Administered Medications  Medication Dose Route Frequency Provider Last Rate Last Dose  . acetaminophen (TYLENOL) tablet 1,000 mg  1,000 mg Oral Q6H PRN Kerry Hough, PA-C      . alum & mag hydroxide-simeth (MAALOX/MYLANTA) 200-200-20 MG/5ML suspension 30 mL  30 mL Oral Q4H PRN Kerry Hough, PA-C      . amLODipine (NORVASC) tablet 5 mg  5 mg Oral q morning - 10a Kerry Hough, PA-C   5 mg at 12/07/12 1030  . divalproex (DEPAKOTE ER) 24 hr tablet 1,000 mg  1,000 mg Oral QHS Kerry Hough, PA-C   1,000 mg at 12/06/12 2144  . divalproex (DEPAKOTE ER) 24 hr tablet 500 mg  500 mg Oral Daily Rachael Fee, MD   500 mg at 12/07/12 0816  . [START ON 12/08/2012] gabapentin (NEURONTIN) capsule 200 mg  200 mg Oral BID Rachael Fee, MD      . gabapentin (NEURONTIN) capsule 200 mg  200 mg Oral Once Rachael Fee, MD      . hydrOXYzine (ATARAX/VISTARIL) tablet 25 mg  25 mg Oral Q6H PRN Kerry Hough, PA-C   25 mg at 12/06/12 2146  . ibuprofen (ADVIL,MOTRIN) tablet 600 mg  600 mg Oral Q6H PRN Kerry Hough, PA-C   600 mg at 12/07/12 1035  .  insulin aspart (novoLOG) injection 0-15 Units  0-15 Units Subcutaneous TID WC Spencer E Simon, PA-C      . insulin aspart (novoLOG) injection 0-5 Units  0-5 Units Subcutaneous QHS Spencer E Simon, PA-C      . magnesium hydroxide (MILK OF MAGNESIA) suspension 30 mL  30 mL Oral Daily PRN Kerry Hough, PA-C      . metFORMIN (GLUCOPHAGE) tablet 500 mg  500 mg Oral BID Kerry Hough, PA-C   500 mg at 12/07/12 0816  . metoprolol (LOPRESSOR) tablet 50 mg  50 mg Oral BID Kerry Hough, PA-C   50 mg at 12/07/12 0816  . QUEtiapine (SEROQUEL) tablet 400 mg  400 mg Oral QHS Kerry Hough, PA-C   400 mg at 12/06/12 2144  . simvastatin (ZOCOR) tablet 10 mg  10 mg  Oral QHS Kerry Hough, PA-C   10 mg at 12/06/12 2144  . traZODone (DESYREL) tablet 100 mg  100 mg Oral QHS Rachael Fee, MD        Lab Results:  Results for orders placed during the hospital encounter of 12/06/12 (from the past 48 hour(s))  GLUCOSE, CAPILLARY     Status: None   Collection Time    12/06/12  5:58 AM      Result Value Range   Glucose-Capillary 90  70 - 99 mg/dL  VALPROIC ACID LEVEL     Status: None   Collection Time    12/06/12  6:20 AM      Result Value Range   Valproic Acid Lvl 94.4  50.0 - 100.0 ug/mL   Comment: Performed at [facility]  HEMOGLOBIN A1C     Status: Abnormal   Collection Time    12/06/12  6:20 AM      Result Value Range   Hemoglobin A1C 5.7 (*) <5.7 %   Comment: (NOTE)                                                                               According to the ADA Clinical Practice Recommendations for 2011, when     HbA1c is used as a screening test:      >=6.5%   Diagnostic of Diabetes Mellitus               (if abnormal result is confirmed)     5.7-6.4%   Increased risk of developing Diabetes Mellitus     References:Diagnosis and Classification of Diabetes Mellitus,Diabetes     Care,2011,34(Suppl 1):S62-S69 and Standards of Medical Care in             Diabetes - 2011,Diabetes Care,2011,34 (Suppl 1):S11-S61.   Mean Plasma Glucose 117 (*) <117 mg/dL   Comment: Performed at [facility]  GLUCOSE, CAPILLARY     Status: Abnormal   Collection Time    12/06/12 11:41 AM      Result Value Range   Glucose-Capillary 113 (*) 70 - 99 mg/dL  GLUCOSE, CAPILLARY     Status: None   Collection Time    12/06/12  4:25 PM      Result Value Range   Glucose-Capillary 97  70 - 99 mg/dL  GLUCOSE, CAPILLARY  Status: Abnormal   Collection Time    12/06/12  9:33 PM      Result Value Range   Glucose-Capillary 130 (*) 70 - 99 mg/dL   Comment 1 Documented in Chart     Comment 2 Notify RN    GLUCOSE, CAPILLARY     Status: None    Collection Time    12/07/12  6:11 AM      Result Value Range   Glucose-Capillary 81  70 - 99 mg/dL    Physical Findings: AIMS: Facial and Oral Movements Muscles of Facial Expression: None, normal Lips and Perioral Area: None, normal Jaw: None, normal Tongue: None, normal,Extremity Movements Upper (arms, wrists, hands, fingers): None, normal Lower (legs, knees, ankles, toes): None, normal, Trunk Movements Neck, shoulders, hips: None, normal, Overall Severity Severity of abnormal movements (highest score from questions above): None, normal Incapacitation due to abnormal movements: None, normal Patient's awareness of abnormal movements (rate only patient's report): No Awareness, Dental Status Current problems with teeth and/or dentures?: No Does patient usually wear dentures?: No  CIWA:    COWS:     Treatment Plan Summary: Daily contact with patient to assess and evaluate symptoms and progress in treatment Medication management  Plan: Supportive approach/coping skills/relapse prevention           Resume the Neurontin 200 mg BID                                 Trazodone 100mg  HS  Medical Decision Making Problem Points:  Review of psycho-social stressors (1) Data Points:  Review of medication regiment & side effects (2)  I certify that inpatient services furnished can reasonably be expected to improve the patient's condition.   Bryne Lindon A 12/07/2012, 3:47 PM

## 2012-12-07 NOTE — Progress Notes (Signed)
D: Patient denies SI/HI and A/V hallucinations; patient reports sleep is poor because of back pain with the bed; reports appetite is good ; reports energy level is normal ; reports ability to pay attention is improving; rates depression as 5/10; rates hopelessness 5/10;    A: Monitored q 15 minutes; patient encouraged to attend groups; patient educated about medications; patient given medications per physician orders; patient encouraged to express feelings and/or concerns  R: Patient is appropriate and is brighter except that she reporting some pain this morning due to her back; patient's interaction with staff and peers is appropriate; p patient is taking medications as prescribed and tolerating medications; patient is not attending groups today because she reports being tired because she did not have a good night's rest

## 2012-12-07 NOTE — [facility] Suicide Risk Assessment (Signed)
[facility] INPATIENT:  Family/Significant Other Suicide Prevention Education  Suicide Prevention Education:  Contact Attempts: Harmony Sandell (pt's daughter) has been identified by the patient as the family member/significant other with whom the patient will be residing, and identified as the person(s) who will aid the patient in the event of a mental health crisis.  With written consent from the patient, two attempts were made to provide suicide prevention education, prior to and/or following the patient's discharge.  We were unsuccessful in providing suicide prevention education.  A suicide education pamphlet was given to the patient to share with family/significant other.  Date and time of first attempt: 12/06/12 2:00PM Date and time of second attempt:: 12/07/12 2:30PM  Smart, Pecolia Marando 12/07/2012, 2:36 PM

## 2012-12-07 NOTE — ED Provider Notes (Signed)
Medical screening examination/treatment/procedure(s) were performed by non-physician practitioner and as supervising physician I was immediately available for consultation/collaboration.   Evella Kasal J. Morrie Daywalt, MD 12/07/12 1639 

## 2012-12-07 NOTE — Progress Notes (Signed)
D: Patient denies SI/HI/AVH. Patient affect is anxious. Mood is depressed.  Pt states, "I haven't used since I left.  I consider July 30th as my clean date.  Yes, I got kicked out , but it's ok.  I have somewhere to go when I get out."  Patient did NOT attend evening group. Patient visible on the milieu. No distress noted. A: Support and encouragement offered. Scheduled medications given to pt. Q 15 min checks continued for patient safety. R: Patient receptive. Patient remains safe on the unit.

## 2012-12-07 NOTE — [facility] Group Notes (Signed)
[facility] LCSW Aftercare Discharge Planning Group Note   12/07/2012 9:19 AM  Participation Quality:  Appropriate   Mood/Affect:  Excited  Depression Rating:  5  Anxiety Rating:  5  Thoughts of Suicide:  No Will you contract for safety?   NA  Current AVH:  No  Plan for Discharge/Comments:  Pt reports that her friend Jonny Ruiz found her a place to live upon d/c. She will continue to followup at [facility] o/p for CDIOP with Dewayne Hatch. Ann visited with her this morning. She stated that she feels resentful toward the [facility] that kicked her out and does not want to return to an [facility].   Transportation Means: John/friend  Supports: some Engineer, water, Research scientist (physical sciences)

## 2012-12-07 NOTE — [facility] Group Notes (Signed)
[facility] LCSW Group Therapy  12/07/2012 2:52 PM  Type of Therapy:  Group Therapy  Participation Level:  Active  Participation Quality:  Attentive  Affect:  Excited  Cognitive:  Alert and Oriented  Insight:  Engaged  Engagement in Therapy:  Engaged  Modes of Intervention:  Discussion, Education, Exploration, Socialization and Support  Summary of Progress/Problems: Emotion Regulation: This group focused on both positive and negative emotion identification and allowed group members to process ways to identify feelings, regulate negative emotions, and find healthy ways to manage internal/external emotions. Group members were asked to reflect on a time when their reaction to an emotion led to a negative outcome and explored how alternative responses using emotion regulation would have benefited them. Group members were also asked to discuss a time when emotion regulation was utilized when a negative emotion was experienced. Lynn Palmer was engaged and attentive throughout group today. She tended to monopolize conversation and demonstrated tangential thinking, but was easily redirected. Lynn Palmer stated that she felt resentful when first coming to [facility]. She was able to identify how this emotion felt both physically and mentally. She stated that she has a bad temper and would frequently give in to impulse when dealing with negative emotions in the past. Lynn Palmer stated that she plans to start cooking again (hobby) and plans to stay away from her primary trigger "my daughter." Lynn Palmer shows progress in the group setting and improving insight AEB her ability to identify drinking/anger outbursts as negative responses and identify positive coping skills like taking up a hobby (cooking) and avoiding triggers/getting sponsor/going to CD IOP as ways to address negative emotions in a healthier way.    Smart, Lynn Palmer 12/07/2012, 2:52 PM

## 2012-12-07 NOTE — Progress Notes (Signed)
Attended NA meeting

## 2012-12-07 NOTE — Tx Team (Signed)
Interdisciplinary Treatment Plan Update (Adult)  Date: 12/07/2012   Time Reviewed: 10:01 AM  Progress in Treatment:  Attending groups: Yes Participating in groups: Yes   Taking medication as prescribed: Yes  Tolerating medication: Yes  Family/Significant othe contact made: Not yet. SPE required for this pt.   Patient understands diagnosis: Yes, AEB seeking treatment for SI and depression.  Discussing patient identified problems/goals with staff: Yes  Medical problems stabilized or resolved: Yes  Denies suicidal/homicidal ideation: Yes  Patient has not harmed self or Others: Yes  New problem(s) identified: n/a  Discharge Plan or Barriers: Pt plans to continue with [facility] CD IOP and will be living with friend upon d/c.  Additional comments: 52 Y/O female who was detox at this unit and D/C August the 6 th. She was admitted to a half way house. She states she tested positive for Benzodiazepines. She was asked to leave the house. She got very upset and was taken to the ED. She stated she was going to kill herself. She claimed that she did not use benzodiazepines. She has a gastric lap band. (The Librium's metabolism could have been slowed down) she was not given a chance to reason with the why it could have been high.  Reason for Continuation of Hospitalization: Mood stabilization Medication management Estimated length of stay: 1-2 days  For review of initial/current patient goals, please see plan of care.  Attendees:  Patient:    Family:    Physician: Geoffery Lyons MD 12/07/2012 10:00 AM   Nursing: Lupita Leash RN 12/07/2012 10:00 AM   Clinical Social Worker [employer], LCSWA  12/07/2012 10:00 AM   Other: Victorino Dike RN 12/07/2012 10:01 AM   Other: Aggie PA 12/07/2012 10:01 AM   Other: Philippa Chester RN 12/07/2012 10:01 AM   Other: Darden Dates Nurse CM 12/07/2012 10:01 AM   Scribe for Treatment Team:  Herbert Seta Smart LCSWA 12/07/2012 10:01 AM

## 2012-12-08 LAB — GLUCOSE, CAPILLARY: Glucose-Capillary: 113 mg/dL — ABNORMAL HIGH (ref 70–99)

## 2012-12-08 MED ORDER — AMLODIPINE BESYLATE 5 MG PO TABS
5.0000 mg | ORAL_TABLET | Freq: Every morning | ORAL | Status: DC
  Administered 2012-12-09: 5 mg via ORAL
  Filled 2012-12-08 (×2): qty 1
  Filled 2012-12-08: qty 4

## 2012-12-08 NOTE — Progress Notes (Signed)
The focus of this group is to educate the patient on the purpose and policies of crisis stabilization and provide a format to answer questions about their admission.  The group details unit policies and expectations of patients while admitted. Patient did not attend. 
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
## 2012-12-08 NOTE — Progress Notes (Signed)
[facility] Group Notes:  (Nursing/MHT/Case Management/Adjunct)  Date:  12/08/2012  Time:  10:10 PM  Type of Therapy:  Group Therapy  Participation Level:  Active  Participation Quality:  Appropriate  Affect:  Appropriate  Cognitive:  Appropriate  Insight:  Appropriate  Engagement in Group:  Engaged  Modes of Intervention:  Activity, Socialization and Support  Summary of Progress/Problems: Pt. Was engaged in activity, and stated she watches T.V and movies as a coping skill for stress.  Sondra Come 12/08/2012, 10:10 PM

## 2012-12-08 NOTE — Progress Notes (Signed)
Patient ID: Lynn Palmer, female   DOB: Jul 30, 1960, 52 y.o.   MRN: [id number] D: Pt. Sitting in dayroom interacting with other clients.  pt. Reports feeling nervous about going to a new recovery house, but does say that she is familiar with staff over the house. A: Writer provided emotional support and encouraged client to move forward in recovery. Staff encouraged group. Pt. Will be monitored q68min for safety. R: Pt. Attended group. Pt. Is safe on the unit.

## 2012-12-08 NOTE — Progress Notes (Signed)
Adult Psychoeducational Group Note  Date:  12/08/2012 Time:  10:12 AM  Group Topic/Focus:  Alcholics Anonymous  Participation Level:  Active  Participation Quality:  Appropriate  Affect:  Appropriate  Cognitive:  Appropriate  Insight: Appropriate  Engagement in Group:  Engaged  Modes of Intervention:  Discussion  Additional Comments:  Pt attended AA group with counselor. Pt was engaged and appropriate.  [place] Shi K 12/08/2012, 10:12 AM

## 2012-12-08 NOTE — [facility] Group Notes (Signed)
[facility] LCSW Group Therapy  12/08/2012 2:20 PM  Type of Therapy:  Group Therapy  Participation Level:  Active  Participation Quality:  Attentive  Affect:  Appropriate  Cognitive:  Alert  Insight:  Engaged  Engagement in Therapy:  Engaged  Modes of Intervention:  Discussion, Education, Exploration, Socialization and Support  Summary of Progress/Problems:  Finding Balance in Life. Today's group focused on defining balance in one's own words, identifying things that can knock one off balance, and exploring healthy ways to maintain balance in life. Group members were asked to provide an example of a time when they felt off balance, describe how they handled that situation,and process healthier ways to regain balance in the future. Group members were asked to share the most important tool for maintaining balance that they learned while at [facility] and how they plan to apply this method after discharge. Lynn Palmer stated that to her, balance meant "being well rounded and having all the important pieces of you life [employer], fun, self love/self care, and family." She stated that negative people, her temper, and her addiction have knocked her off balance recently. Lynn Palmer shows progress in the group setting AEB her ability to actively participate without monopolizing conversation. Else shows improving insight AEB her ability to identify ways to reestablish a sense of balance in life "I am going to force myself to make time to do what I like, and that is swimming. I have a membership at the [employer] also think meetings help and I just got an AA sponsor."    Smart, Jamelah Sitzer 12/08/2012, 2:20 PM

## 2012-12-09 ENCOUNTER — Other Ambulatory Visit (HOSPITAL_COMMUNITY): Payer: Medicare Other

## 2012-12-09 DIAGNOSIS — F319 Bipolar disorder, unspecified: Secondary | ICD-10-CM

## 2012-12-09 DIAGNOSIS — F1020 Alcohol dependence, uncomplicated: Secondary | ICD-10-CM

## 2012-12-09 LAB — GLUCOSE, CAPILLARY: Glucose-Capillary: 87 mg/dL (ref 70–99)

## 2012-12-09 LAB — RAPID URINE DRUG SCREEN, HOSP PERFORMED
Barbiturates: NOT DETECTED
Benzodiazepines: POSITIVE — AB

## 2012-12-09 MED ORDER — SIMVASTATIN 10 MG PO TABS: 10.0000 mg | ORAL_TABLET | Freq: Every day | ORAL | Status: DC

## 2012-12-09 MED ORDER — IBUPROFEN 200 MG PO TABS: 800.0000 mg | ORAL_TABLET | Freq: Four times a day (QID) | ORAL | Status: DC | PRN

## 2012-12-09 MED ORDER — TRAZODONE HCL 100 MG PO TABS: 100.0000 mg | ORAL_TABLET | Freq: Every day | ORAL | Status: DC

## 2012-12-09 MED ORDER — AMLODIPINE BESYLATE 5 MG PO TABS: 5.0000 mg | ORAL_TABLET | Freq: Every morning | ORAL | Status: DC

## 2012-12-09 MED ORDER — DIVALPROEX SODIUM ER 500 MG PO TB24: ORAL_TABLET | ORAL | Status: DC

## 2012-12-09 MED ORDER — GABAPENTIN 100 MG PO CAPS: 200.0000 mg | ORAL_CAPSULE | Freq: Two times a day (BID) | ORAL | Status: DC

## 2012-12-09 MED ORDER — HYDROXYZINE HCL 25 MG PO TABS
25.0000 mg | ORAL_TABLET | Freq: Three times a day (TID) | ORAL | Status: DC
  Filled 2012-12-09: qty 12

## 2012-12-09 MED ORDER — METFORMIN HCL 500 MG PO TABS: 500.0000 mg | ORAL_TABLET | Freq: Two times a day (BID) | ORAL | Status: DC

## 2012-12-09 MED ORDER — METOPROLOL TARTRATE 50 MG PO TABS: 50.0000 mg | ORAL_TABLET | Freq: Two times a day (BID) | ORAL | Status: DC

## 2012-12-09 MED ORDER — QUETIAPINE FUMARATE 400 MG PO TABS: 400.0000 mg | ORAL_TABLET | Freq: Every day | ORAL | Status: DC

## 2012-12-09 MED ORDER — ACETAMINOPHEN 500 MG PO TABS: 1000.0000 mg | ORAL_TABLET | Freq: Four times a day (QID) | ORAL | Status: DC | PRN

## 2012-12-09 MED ORDER — HYDROXYZINE HCL 25 MG PO TABS: 25.0000 mg | ORAL_TABLET | Freq: Three times a day (TID) | ORAL | Status: DC

## 2012-12-09 NOTE — Progress Notes (Signed)
[facility] Adult Case Management Discharge Plan :  Will you be returning to the same living situation after discharge: No. At discharge, do you have transportation home?:Yes,  Friend Do you have the ability to pay for your medications:Yes,  [employer]  Release of information consent forms completed and in the chart;  Patient's signature needed at discharge.  Patient to Follow up at: Follow-up Information   Follow up with [facility] On 12/12/2012. (Attend group beginning Monday at 1PM unless otherwise instructed by Dewayne Hatch. )    Contact information:   [address] [place], [place] [address] Phone: [phone] Fax: [phone]      Patient denies SI/HI:   Yes,  during group/self report    Safety Planning and Suicide Prevention discussed:  Yes,  SPE completed with pt. Contact attempts made with pt's daughter but unable to reach her. SPI pamphlet also provided to pt and she was encouraged to share with her support system.  Smart, Melissa Tomaselli 12/09/2012, 11:18 AM

## 2012-12-09 NOTE — BHH Group Notes (Signed)
[facility] LCSW Aftercare Discharge Planning Group Note   12/09/2012 9:38 AM  Participation Quality:  Appropriate   Mood/Affect:  Appropriate  Depression Rating:  0  Anxiety Rating:  5  Thoughts of Suicide:  No Will you contract for safety?   NA  Current AVH:  No  Plan for Discharge/Comments:  Pt stated that she is d/cing today and going to transitional house due to being kicked out of her [place] house. CSW giving pt [employer] to take. Pt will f/u with Dewayne Hatch [facility]   Transportation Means: friend  Supports: friend/John and daughter  Counselling psychologist, Herbert Seta

## 2012-12-09 NOTE — Progress Notes (Signed)
Patient discharged per physician order; patient denies SI/HI and A/V hallucinations; patient received samples, prescriptions, copy of AVS and a letter after it was reviewed; patient is appropriate; patient had no other questions or cocnerns at this time; patient left the unit ambulatory

## 2012-12-09 NOTE — Progress Notes (Signed)
Adult Psychoeducational Group Note  Date:  12/09/2012 Time:  12:54 PM  Group Topic/Focus:  Relapse Prevention Planning:   The focus of this group is to define relapse and discuss the need for planning to combat relapse.  Participation Level:  Active  Participation Quality:  Appropriate  Affect:  Appropriate  Cognitive:  Appropriate  Insight: Appropriate  Engagement in Group:  Engaged  Modes of Intervention:  Activity and Discussion  Additional Comments:  Pt where taught how to write SMART goals in relation to relapse and recovery.   Tora Perches N 12/09/2012, 12:54 PM

## 2012-12-09 NOTE — BHH Group Notes (Signed)
[facility] LCSW Group Therapy  12/09/2012 2:24 PM  Type of Therapy:  Group Therapy  Participation Level:  Did Not Attend  Smart, Herbert Seta 12/09/2012, 2:24 PM

## 2012-12-09 NOTE — Discharge Summary (Signed)
Physician Discharge Summary Note  Patient:  Lynn Palmer is an 52 y.o., female MRN:  [id number] DOB:  13-Mar-1961 Patient phone:  [phone] (home)  Patient address:   [phone]  [place] [address],   Date of Admission:  12/06/2012 Date of Discharge: 12/09/12  Reason for Admission:  Suicidal threats  Discharge Diagnoses: Principal Problem:   Polysubstance abuse Active Problems:   Bipolar disorder   Alcohol dependence  Review of Systems  Constitutional: Negative.   Eyes: Negative.   Respiratory: Negative.   Cardiovascular: Negative.   Gastrointestinal: Negative.   Genitourinary: Negative.   Musculoskeletal: Negative.   Skin: Negative.   Neurological: Negative.   Endo/Heme/Allergies: Negative.   Psychiatric/Behavioral: Positive for depression (Stabilized with medication prior to discharge) and substance abuse (Polysubstance dependence). Negative for suicidal ideas, hallucinations and memory loss. The patient is nervous/anxious (Stabilized with medication prior to discharge) and has insomnia (Stabilized with medication prior to discharge).     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 Bipolar 1 disorder   . Obesity   . Diabetes mellitus   . Hyperlipidemia   . Hypertension   . Vomiting   . Depression   . Anxiety   . Substance abuse     alcohol    AXIS IV:  economic problems, housing problems, occupational problems and Polysubstance dependence AXIS V:  63  Level of Care:  OP  Hospital Course:  52 Y/O female who was detox at this unit and D/C August the 6 th. She was  admitted to a half way house. She states she tested positive for Benzodiazepines. She was asked to leave the house. She got very upset and was taken to the ED. She stated she was going to kill herself. She claimed that she did not use benzodiazepines. She has a gastric lap band. (The Librium's metabolism could have been slowed down) she was not given a chance to reason with the why it could have been high.   During her present hospital stay, Lynn Palmer was assessed/evaluated and recommended to not receive any treatment protocol. This decision was based on her UDS/toxicology results that showed (+) Benzodiazepine (residual effect) from her use of long acting Librium capsules for her detoxification treatment protocol when she was here previously for detox. She was not displaying any withdrawal symptoms of benzodiazepines and or any other substance. However, she was obvious upset and depressed and would require her mood to be re-stabilized. She was also enrolled in group counseling sessions/activities where she was counseled, taught and learned coping skills that should help her after discharge to cope better and manage her problems with substance abuse to maintain a much longer sobriety. Lynn Palmer was also enrolled/attended AA/NA meetings being offered and held on this unit.  Besides detoxification treatment protocol, Lynn Palmer was also ordered and received (Gabapentin) Neurontin 100 mg tid for anxiety management, Depakoke ER 500 mg in am and 100 mg Q bedtime for mood stabilization, Hydroxyzine 25 mg tid for anxiety, Seroquel 400 mg Q bedtime for mood control and Trazodone 100 mg Q bedtime for sleep. She has other previously existing and or other medical issues and or concerns that required medication management/monitoring, she  received medication management for all those health issues. She was monitored closely for any potential problems that may arise as a result of her treatments. Lynn Palmer tolerated her treatment regimen  without any significant adverse effects and or reactions presented.  Lynn Palmer is currently is showing improved condition from her depressed mood, this is evidenced by her verbal reports of improved symptoms, presentation of good affect and absence of tremors, dizziness etc. It was then decided that she is ready to be discharged to start her CDIOP on an outpatient basis on 12/12/12 at 1:00 pm with Dewayne Hatch for her substance abuse issues. Upon discharge, patient adamantly denies any suicidal, homicidal ideations, auditory, visual hallucinations, delusional thoughts, paranoia and or withdrawal symptoms. She received from [facility] a 2 weeks worth supply samples of her [facility] discharge medications and 30 days worth of prescriptions. Patient left [facility] with all personal belongings in no apparent distress. Transportation per friend.   Consults:  psychiatry  Significant Diagnostic Studies:  labs: CBC with diff, CMP, UDS, Toxicology tests  Discharge Vitals:   Blood pressure 131/88, pulse 82, temperature 97.6 F (36.4 C), temperature source Oral, resp. rate 20, last menstrual period 11/21/2012. There is no weight on file to calculate BMI. Lab Results:   Results for orders placed during the hospital encounter of 12/06/12 (from the past 72 hour(s))  GLUCOSE, CAPILLARY     Status: None   Collection Time    12/09/12 11:54 AM      Result Value Range   Glucose-Capillary 87  70 - 99 mg/dL   Comment 1 Notify RN      Physical Findings: AIMS: Facial and Oral Movements Muscles of Facial Expression: None, normal Lips and Perioral Area: None, normal Jaw: None, normal Tongue: None, normal,Extremity Movements Upper (arms, wrists, hands, fingers): None, normal Lower (legs, knees, ankles, toes): None, normal, Trunk Movements Neck, shoulders, hips: None, normal, Overall Severity Severity of abnormal movements (highest score from questions above): None, normal Incapacitation due to abnormal movements: None, normal Patient's  awareness of abnormal movements (rate only patient's report): No Awareness, Dental Status Current problems with teeth and/or dentures?: No Does patient usually wear dentures?: No  CIWA:  CIWA-Ar Total: 1 COWS:     Psychiatric Specialty Exam: See Psychiatric Specialty Exam and Suicide Risk Assessment completed by Attending Physician prior to discharge.  Discharge destination:  Other:  CD-IOP  Is patient on multiple antipsychotic therapies at discharge:  No   Has Patient had three or more failed trials of antipsychotic monotherapy by history:  No  Recommended Plan for Multiple Antipsychotic Therapies: NA     Medication List       Indication   acetaminophen 500 MG tablet  Commonly known as:  TYLENOL  Take 2 tablets (1,000 mg total) by mouth every 6 (six) hours as needed for pain.   Indication:  Fever, Pain     amLODipine 5 MG tablet  Commonly known as:  NORVASC  Take 1 tablet (5 mg total) by mouth every morning. For hypertension   Indication:  High Blood Pressure     divalproex 500 MG 24 hr tablet  Commonly known as:  DEPAKOTE ER  Take 1 tablet (500 mg ) Q daily and 2 tablets (1000 mg) Q bedtime: For mood stabilization   Indication:  Mood instabilization     gabapentin 100 MG capsule  Commonly known as:  NEURONTIN  Take 2 capsules (200 mg total) by mouth 2 (two) times daily. For anxiety/pain management   Indication:  Agitation,  Pain, Anxiety     hydrOXYzine 25 MG tablet  Commonly known as:  ATARAX/VISTARIL  Take 1 tablet (25 mg total) by mouth 3 (three) times daily. For anxiety   Indication:  Anxiety associated with Organic Disease, Tension     ibuprofen 200 MG tablet  Commonly known as:  ADVIL,MOTRIN  Take 4 tablets (800 mg total) by mouth every 6 (six) hours as needed for pain.   Indication:  Mild to Moderate Pain     metFORMIN 500 MG tablet  Commonly known as:  GLUCOPHAGE  Take 1 tablet (500 mg total) by mouth 2 (two) times daily. For diabetes control    Indication:  Type 2 Diabetes     metoprolol 50 MG tablet  Commonly known as:  LOPRESSOR  Take 1 tablet (50 mg total) by mouth 2 (two) times daily. For high blood pressure control   Indication:  High Blood Pressure     QUEtiapine 400 MG tablet  Commonly known as:  SEROQUEL  Take 1 tablet (400 mg total) by mouth at bedtime. For mood control   Indication:  Mood control     simvastatin 10 MG tablet  Commonly known as:  ZOCOR  Take 1 tablet (10 mg total) by mouth at bedtime. For high cholesterol control   Indication:  Inherited Heterozygous Hypercholesterolemia, Inherited Homozygous Hypercholesterolemia, Increased Fats, Triglycerides & Cholesterol in the Blood     traZODone 100 MG tablet  Commonly known as:  DESYREL  Take 1 tablet (100 mg total) by mouth at bedtime. For sleep   Indication:  Trouble Sleeping       Follow-up Information   Follow up with [facility] On 12/12/2012. (Attend group beginning Monday at 1PM unless otherwise instructed by Dewayne Hatch. )    Contact information:   [address] [place], [place] [address] Phone: [phone] Fax: [phone]     Follow-up recommendations: Activity:  As tolerated Diet: As recommended by your primary care doctor. Keep all scheduled follow-up appointments as recommended.  Comments:  Take all your medications as prescribed by your mental healthcare provider. Report any adverse effects and or reactions from your medicines to your outpatient provider promptly. Patient is instructed and cautioned to not engage in alcohol and or illegal drug use while on prescription medicines. In the event of worsening symptoms, patient is instructed to call the crisis hotline, 911 and or go to the nearest ED for appropriate evaluation and treatment of symptoms. Follow-up with your primary care provider for your other medical issues, concerns and or health care needs.  Continue to work your relapse prevention plan Madie Reno A. Mitchael Luckey,M.D. Total Discharge Time:   Greater than 30 minutes.  Signed: Armandina Stammer I, PMHNP-BC 12/12/2012, 8:59 AM

## 2012-12-09 NOTE — BHH Suicide Risk Assessment (Signed)
Suicide Risk Assessment  Discharge Assessment     Demographic Factors:  Caucasian  Mental Status Per Nursing Assessment::   On Admission:  Self-harm thoughts  Current Mental Status by Physician: In full contact with reality. There are no suicidal ideas, plans or intent. Her mood is euthymic, her affect is appropriate. She is willing and motivated to pursue further outpatient treatment. Will come to the [facility] and continue to go to AA. She is going to be in a new half way house. Will write a note explaining the factors that could have affected the metabolism and elimination of Librium.     Loss Factors: Loss of significant relationship and Decline in physical health  Historical Factors: NA  Risk Reduction Factors:   Sense of responsibility to family and Positive social support  Continued Clinical Symptoms:  Bipolar Disorder:   Depressive phase Alcohol/Substance Abuse/Dependencies  Cognitive Features That Contribute To Risk:  Closed-mindedness Polarized thinking Thought constriction (tunnel vision)    Suicide Risk:  Minimal: No identifiable suicidal ideation.  Patients presenting with no risk factors but with morbid ruminations; may be classified as minimal risk based on the severity of the depressive symptoms  Discharge Diagnoses:   AXIS I:  Alcohol Dependence, Bipolar Depressed AXIS II:  Deferred AXIS III:   Past Medical History  Diagnosis Date  . Bipolar 1 disorder   . Obesity   . Diabetes mellitus   . Hyperlipidemia   . Hypertension   . Vomiting   . Depression   . Anxiety   . Substance abuse     alcohol    AXIS IV:  other psychosocial or environmental problems AXIS V:  61-70 mild symptoms  Plan Of Care/Follow-up recommendations:  Activity:  as tolerated Diet:  regular Follow up [facility] Is patient on multiple antipsychotic therapies at discharge:  No   Has Patient had three or more failed trials of antipsychotic monotherapy by  history:  No  Recommended Plan for Multiple Antipsychotic Therapies: NA  Anneth Brunell A 12/09/2012, 10:53 AM

## 2012-12-14 ENCOUNTER — Other Ambulatory Visit (HOSPITAL_COMMUNITY): Payer: Medicare Other | Attending: Psychiatry

## 2012-12-14 NOTE — Progress Notes (Signed)
Patient Discharge Instructions:  Next Level Care Provider Has Access to the EMR, 12/14/12 Records provided to [facility] via [facility]/Epic access.  Lynn Palmer, 12/14/2012, 2:10 PM

## 2012-12-15 ENCOUNTER — Other Ambulatory Visit (HOSPITAL_COMMUNITY): Payer: Self-pay | Admitting: Psychiatry

## 2012-12-16 ENCOUNTER — Other Ambulatory Visit (HOSPITAL_COMMUNITY): Payer: Medicare Other

## 2012-12-19 ENCOUNTER — Other Ambulatory Visit (HOSPITAL_COMMUNITY): Payer: Medicare Other

## 2012-12-21 ENCOUNTER — Other Ambulatory Visit (HOSPITAL_COMMUNITY): Payer: Medicare Other

## 2012-12-23 ENCOUNTER — Other Ambulatory Visit (HOSPITAL_COMMUNITY): Payer: Medicare Other

## 2012-12-26 ENCOUNTER — Other Ambulatory Visit (HOSPITAL_COMMUNITY): Payer: Medicare Other

## 2012-12-28 ENCOUNTER — Other Ambulatory Visit (HOSPITAL_COMMUNITY): Payer: Medicare Other

## 2012-12-30 ENCOUNTER — Other Ambulatory Visit (HOSPITAL_COMMUNITY): Payer: Medicare Other

## 2013-01-02 ENCOUNTER — Other Ambulatory Visit (HOSPITAL_COMMUNITY): Payer: Medicare Other

## 2013-01-04 ENCOUNTER — Other Ambulatory Visit (HOSPITAL_COMMUNITY): Payer: Medicare Other

## 2013-01-06 ENCOUNTER — Other Ambulatory Visit (HOSPITAL_COMMUNITY): Payer: Self-pay | Admitting: Psychiatry

## 2013-01-06 ENCOUNTER — Other Ambulatory Visit (HOSPITAL_COMMUNITY): Payer: Medicare Other

## 2013-01-09 ENCOUNTER — Other Ambulatory Visit (HOSPITAL_COMMUNITY): Payer: Medicare Other

## 2013-01-11 ENCOUNTER — Other Ambulatory Visit (HOSPITAL_COMMUNITY): Payer: Medicare Other

## 2013-01-12 ENCOUNTER — Other Ambulatory Visit (HOSPITAL_COMMUNITY): Payer: Self-pay | Admitting: Psychiatry

## 2013-01-13 ENCOUNTER — Other Ambulatory Visit (HOSPITAL_COMMUNITY): Payer: Medicare Other

## 2013-01-16 ENCOUNTER — Other Ambulatory Visit (HOSPITAL_COMMUNITY): Payer: Medicare Other

## 2013-01-18 ENCOUNTER — Other Ambulatory Visit (HOSPITAL_COMMUNITY): Payer: Medicare Other

## 2013-01-20 ENCOUNTER — Other Ambulatory Visit (HOSPITAL_COMMUNITY): Payer: Medicare Other

## 2013-01-23 ENCOUNTER — Other Ambulatory Visit (HOSPITAL_COMMUNITY): Payer: Medicare Other

## 2013-01-25 ENCOUNTER — Other Ambulatory Visit (HOSPITAL_COMMUNITY): Payer: Medicare Other

## 2013-01-27 ENCOUNTER — Other Ambulatory Visit (HOSPITAL_COMMUNITY): Payer: Medicare Other

## 2013-02-09 ENCOUNTER — Other Ambulatory Visit (HOSPITAL_COMMUNITY): Payer: Self-pay | Admitting: Psychiatry

## 2013-03-21 ENCOUNTER — Ambulatory Visit: Payer: Self-pay | Admitting: Internal Medicine

## 2013-04-28 ENCOUNTER — Encounter: Payer: Self-pay | Admitting: Internal Medicine

## 2013-04-28 ENCOUNTER — Ambulatory Visit (INDEPENDENT_AMBULATORY_CARE_PROVIDER_SITE_OTHER): Payer: Medicare Other | Admitting: Internal Medicine

## 2013-04-28 VITALS — BP 138/80 | HR 92 | Temp 97.5°F | Resp 18 | Ht 61.0 in | Wt 200.6 lb

## 2013-04-28 DIAGNOSIS — E559 Vitamin D deficiency, unspecified: Secondary | ICD-10-CM

## 2013-04-28 DIAGNOSIS — E669 Obesity, unspecified: Secondary | ICD-10-CM

## 2013-04-28 DIAGNOSIS — Z0000 Encounter for general adult medical examination without abnormal findings: Secondary | ICD-10-CM

## 2013-04-28 DIAGNOSIS — R7402 Elevation of levels of lactic acid dehydrogenase (LDH): Secondary | ICD-10-CM

## 2013-04-28 DIAGNOSIS — Z113 Encounter for screening for infections with a predominantly sexual mode of transmission: Secondary | ICD-10-CM

## 2013-04-28 DIAGNOSIS — R7309 Other abnormal glucose: Secondary | ICD-10-CM

## 2013-04-28 DIAGNOSIS — R740 Nonspecific elevation of levels of transaminase and lactic acid dehydrogenase [LDH]: Secondary | ICD-10-CM

## 2013-04-28 DIAGNOSIS — E119 Type 2 diabetes mellitus without complications: Secondary | ICD-10-CM

## 2013-04-28 DIAGNOSIS — E1059 Type 1 diabetes mellitus with other circulatory complications: Secondary | ICD-10-CM

## 2013-04-28 DIAGNOSIS — Z79899 Other long term (current) drug therapy: Secondary | ICD-10-CM

## 2013-04-28 DIAGNOSIS — I10 Essential (primary) hypertension: Secondary | ICD-10-CM

## 2013-04-28 DIAGNOSIS — Z1212 Encounter for screening for malignant neoplasm of rectum: Secondary | ICD-10-CM

## 2013-04-28 DIAGNOSIS — E782 Mixed hyperlipidemia: Secondary | ICD-10-CM

## 2013-04-28 DIAGNOSIS — Z111 Encounter for screening for respiratory tuberculosis: Secondary | ICD-10-CM

## 2013-04-28 LAB — BASIC METABOLIC PANEL WITH GFR
BUN: 20 mg/dL (ref 6–23)
CO2: 28 mEq/L (ref 19–32)
Calcium: 9.6 mg/dL (ref 8.4–10.5)
Chloride: 98 mEq/L (ref 96–112)
Creat: 1.3 mg/dL — ABNORMAL HIGH (ref 0.50–1.10)
GFR, EST AFRICAN AMERICAN: 55 mL/min — AB
GFR, EST NON AFRICAN AMERICAN: 47 mL/min — AB
Glucose, Bld: 87 mg/dL (ref 70–99)
POTASSIUM: 4 meq/L (ref 3.5–5.3)
SODIUM: 135 meq/L (ref 135–145)

## 2013-04-28 LAB — LIPID PANEL
Cholesterol: 193 mg/dL (ref 0–200)
HDL: 87 mg/dL (ref >39)
LDL CALC: 92 mg/dL (ref 0–99)
TRIGLYCERIDES: 72 mg/dL (ref <150)
Total CHOL/HDL Ratio: 2.2 Ratio
VLDL: 14 mg/dL (ref 0–40)

## 2013-04-28 LAB — MAGNESIUM: Magnesium: 1.7 mg/dL (ref 1.5–2.5)

## 2013-04-28 LAB — HEPATIC FUNCTION PANEL
ALBUMIN: 3.9 g/dL (ref 3.5–5.2)
AST: 13 U/L (ref 0–37)
Alkaline Phosphatase: 54 U/L (ref 39–117)
Bilirubin, Direct: 0.1 mg/dL (ref 0.0–0.3)
Indirect Bilirubin: 0.4 mg/dL (ref 0.0–0.9)
TOTAL PROTEIN: 6.4 g/dL (ref 6.0–8.3)
Total Bilirubin: 0.5 mg/dL (ref 0.3–1.2)

## 2013-04-28 NOTE — Progress Notes (Signed)
Patient ID: Lynn Palmer, female   DOB: [birth year], 53 y.o.   MRN: [id number]  Annual Screening Comprehensive Examination  This very nice 53 y.o. DWF presents as a new patient for complete physical.  Patient has been followed for HTN, T2 NIDDM, Hyperlipidemia, Manic Depressive Bipolar Disorder  and Vitamin D Deficiency.    HTN predates since  63. Patient's BP has not  been monitored at home. Today's BP: 138/80 mmHg. 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 Patient denies any cardiac symptoms as chest pain, palpitations, shortness of breath, dizziness or ankle swelling.   Patient's hyperlipidemia is controlled with diet and medications. Patient denies myalgias or other medication SE's. Last cholesterol last visit was 164, triglycerides 75, HDL 75  and LDL 73 in Apr 2014 - all at goal.     Patient has T2 Diabetes with last A1c  8.3 % in Apr 2014. She reporty FBG's range 90-110 mg %.Patient denies reactive hypoglycemic symptoms, visual blurring, diabetic polys, or paresthesias.   She underwent Lap Band Gastric Surgery by Dr Johna Sheriff in Mar 2011 and lost 102 #, but relates that she gained back 25 #. Finally, patient has history of Manic Depressive Bipolar illness and is followed at [facility]. Further she relates she has been on disability since 2008 because of Obesity, Diabetes and her Bipolar illness.       Medication List       acetaminophen 500 MG tablet  Commonly known as:  TYLENOL  Take 2 tablets (1,000 mg total) by mouth every 6 (six) hours as needed for pain.     amLODipine 5 MG tablet  Commonly known as:  NORVASC  Take 1 tablet (5 mg total) by mouth every morning. For hypertension     divalproex 500 MG 24 hr tablet  Commonly known as:  DEPAKOTE ER  Take 1 tablet (500 mg ) Q daily and 2 tablets (1000 mg) Q bedtime: For mood  stabilization     gabapentin 100 MG capsule  Commonly known as:  NEURONTIN  Take 200 mg by mouth 2 (two) times daily. For anxiety/pain management takes 1 cap BID and 2 caps at HS     ibuprofen 200 MG tablet  Commonly known as:  ADVIL,MOTRIN  Take 4 tablets (800 mg total) by mouth every 6 (six) hours as needed for pain.     metFORMIN 500 MG tablet  Commonly known as:  GLUCOPHAGE  Take 1 tablet (500 mg total) by mouth 2 (two) times daily. For diabetes control     metoprolol tartrate 25 MG tablet  Commonly known as:  LOPRESSOR  Take 25 mg by mouth 2 (two) times daily.     pravastatin 20 MG tablet  Commonly known as:  PRAVACHOL  Take 20 mg by mouth daily.     QUEtiapine 100 MG tablet  Commonly known as:  SEROQUEL  Take 100 mg by mouth at bedtime.     traZODone 100 MG tablet  Commonly known as:  DESYREL  Take 1 tablet (100 mg total) by mouth at bedtime. For sleep        No Known Allergies  Past Medical History  Diagnosis Date  . Bipolar 1 disorder   . Obesity   . Diabetes mellitus   . Hyperlipidemia   . Hypertension   . Vomiting   . Depression   . Anxiety   .  Substance abuse     alcohol     Past Surgical History  Procedure Laterality Date  . Tonsillectomy    . Laparoscopic gastric banding  06/11/09  . Vaginal delivery  1987    Family History  Problem Relation Age of Onset  . Osteoarthritis Mother   . Heart failure Father   . Osteoarthritis Father   . Alcohol abuse Father     History  Substance Use Topics  . Smoking status: Never Smoker   . Smokeless tobacco: Never Used  . Alcohol Use: No     Comment: social/ denies present use    ROS Constitutional: Denies fever, chills, weight loss/gain, headaches, insomnia, fatigue, night sweats, and change in appetite. Eyes: Denies redness, blurred vision, diplopia, discharge, itchy, watery eyes.  ENT: Denies discharge, congestion, post nasal drip, epistaxis, sore throat, earache, hearing loss, dental pain,  Tinnitus, Vertigo, Sinus pain, snoring.  Cardio: Denies chest pain, palpitations, irregular heartbeat, syncope, dyspnea, diaphoresis, orthopnea, PND, claudication, edema Respiratory: denies cough, dyspnea, DOE, pleurisy, hoarseness, laryngitis, wheezing.  Gastrointestinal: Denies dysphagia, heartburn, reflux, water brash, pain, cramps, nausea, vomiting, bloating, diarrhea, constipation, hematemesis, melena, hematochezia, jaundice, hemorrhoids Genitourinary: Denies dysuria, frequency, urgency, nocturia, hesitancy, discharge, hematuria, flank pain Breast:Breast lumps, nipple discharge, bleeding.  Musculoskeletal: Denies arthralgia, myalgia, stiffness, Jt. Swelling, pain, limp, and strain/sprain. Skin: Denies puritis, rash, hives, warts, acne, eczema, changing in skin lesion Neuro: No weakness, tremor, incoordination, spasms, paresthesia, pain Psychiatric: Denies confusion, memory loss, sensory loss Endocrine: Denies change in weight, skin, hair change, nocturia, and paresthesia, diabetic polys, visual blurring, hyper / hypo glycemic episodes.  Heme/Lymph: No excessive bleeding, bruising, enlarged lymph nodes.  BP: 138/80  Pulse: 92  Temp: 97.5 F (36.4 C)  Resp: 18    BMI    37.92 kg/(m^2)   Height    5\' 1"    Weight  200 lb 9.6 oz    Physical Exam General Appearance: Well nourished, in no apparent distress. Eyes: PERRLA, EOMs, conjunctiva no swelling or erythema, normal fundi and vessels. Sinuses: No frontal/maxillary tenderness ENT/Mouth: EACs patent / TMs  nl. Nares clear without erythema, swelling, mucoid exudates. Oral hygiene is good. No erythema, swelling, or exudate. Tongue normal, non-obstructing. Tonsils not swollen or erythematous. Hearing normal.  Neck: Supple, thyroid normal. No bruits, nodes or JVD. Respiratory: Respiratory effort normal.  BS equal and clear bilateral without rales, rhonci, wheezing or stridor. Cardio: Heart sounds are normal with regular rate and rhythm and  no murmurs, rubs or gallops. Peripheral pulses are normal and equal bilaterally without edema. No aortic or femoral bruits. Chest: symmetric with normal excursions and percussion. Breasts: Symmetric, without lumps, nipple discharge, retractions, or fibrocystic changes.  Abdomen: Flat, soft, with bowl sounds. Nontender, no guarding, rebound, hernias, masses, or organomegaly.  Lymphatics: Non tender without lymphadenopathy.  Genitourinary:  Musculoskeletal: Full ROM all peripheral extremities, joint stability, 5/5 strength, and normal gait. Skin: Warm and dry without rashes, lesions, cyanosis, clubbing or  ecchymosis.  Neuro: Cranial nerves intact, reflexes equal bilaterally. Normal muscle tone, no cerebellar symptoms. Sensation intact.  Pysch: Awake and oriented X 3, normal affect, Insight and Judgment appropriate.   Assessment and Plan  1. Annual Screening Examination 2. Hypertension  3. Hyperlipidemia 4. T2 NIDDM 5. Vitamin D Deficiency 6. Obesity (BMI 38)  Continue prudent diet as discussed, weight control, BP monitoring, regular exercise, and medications. Discussed med's effects and SE's. Screening labs and tests as requested with regular follow-up as recommended.

## 2013-04-28 NOTE — Patient Instructions (Signed)

## 2013-04-29 LAB — HEMOGLOBIN A1C
Hgb A1c MFr Bld: 5.9 % — ABNORMAL HIGH (ref <5.7)
MEAN PLASMA GLUCOSE: 123 mg/dL — AB (ref <117)

## 2013-04-29 LAB — CBC WITH DIFFERENTIAL/PLATELET
BASOS PCT: 0 % (ref 0–1)
Basophils Absolute: 0 10*3/uL (ref 0.0–0.1)
Eosinophils Absolute: 0.3 10*3/uL (ref 0.0–0.7)
Eosinophils Relative: 4 % (ref 0–5)
HCT: 37.7 % (ref 36.0–46.0)
HEMOGLOBIN: 13 g/dL (ref 12.0–15.0)
LYMPHS ABS: 3.5 10*3/uL (ref 0.7–4.0)
Lymphocytes Relative: 51 % — ABNORMAL HIGH (ref 12–46)
MCH: 28.8 pg (ref 26.0–34.0)
MCHC: 34.5 g/dL (ref 30.0–36.0)
MCV: 83.6 fL (ref 78.0–100.0)
MONOS PCT: 8 % (ref 3–12)
Monocytes Absolute: 0.6 10*3/uL (ref 0.1–1.0)
NEUTROS ABS: 2.5 10*3/uL (ref 1.7–7.7)
NEUTROS PCT: 37 % — AB (ref 43–77)
Platelets: 223 10*3/uL (ref 150–400)
RBC: 4.51 MIL/uL (ref 3.87–5.11)
RDW: 16 % — ABNORMAL HIGH (ref 11.5–15.5)
WBC: 6.9 10*3/uL (ref 4.0–10.5)

## 2013-04-29 LAB — MICROALBUMIN / CREATININE URINE RATIO
CREATININE, URINE: 263.6 mg/dL
Microalb Creat Ratio: 9.5 mg/g (ref 0.0–30.0)
Microalb, Ur: 2.51 mg/dL — ABNORMAL HIGH (ref 0.00–1.89)

## 2013-04-29 LAB — URINALYSIS, MICROSCOPIC ONLY
Bacteria, UA: NONE SEEN
Casts: NONE SEEN
SQUAMOUS EPITHELIAL / LPF: NONE SEEN

## 2013-04-29 LAB — HEPATITIS C ANTIBODY: HCV Ab: NEGATIVE

## 2013-04-29 LAB — HIV ANTIBODY (ROUTINE TESTING W REFLEX): HIV: NONREACTIVE

## 2013-04-29 LAB — VALPROIC ACID LEVEL: VALPROIC ACID LVL: 116.9 ug/mL — AB (ref 50.0–100.0)

## 2013-04-29 LAB — HEPATITIS B SURFACE ANTIBODY,QUALITATIVE: HEP B S AB: NEGATIVE

## 2013-04-29 LAB — INSULIN, FASTING: Insulin fasting, serum: 5 u[IU]/mL (ref 3–28)

## 2013-04-29 LAB — RPR

## 2013-04-29 LAB — VITAMIN D 25 HYDROXY (VIT D DEFICIENCY, FRACTURES): Vit D, 25-Hydroxy: 38 ng/mL (ref 30–89)

## 2013-04-29 LAB — HEPATITIS A ANTIBODY, TOTAL: Hep A Total Ab: NONREACTIVE

## 2013-04-29 LAB — TSH: TSH: 4.142 u[IU]/mL (ref 0.350–4.500)

## 2013-04-29 LAB — HEPATITIS B CORE ANTIBODY, TOTAL: Hep B Core Total Ab: NONREACTIVE

## 2013-05-01 ENCOUNTER — Telehealth: Payer: Self-pay | Admitting: *Deleted

## 2013-05-01 LAB — HEPATITIS B E ANTIBODY: HEPATITIS BE ANTIBODY: NEGATIVE

## 2013-05-01 NOTE — Telephone Encounter (Signed)
Message copied by Reggy EyeEVANS, Stasha Naraine C on Mon May 01, 2013  2:17 PM ------      Message from: Lucky CowboyMCKEOWN, WILLIAM      Created: Sun Apr 30, 2013 11:23 PM       Aic ok 5.9%  Goal is <5.7% --- Hep A B & C all neg & ok --- HIV/AIDS & RPR/syphilis neg/ok --- Thyroid nl/ok ---       Vit D 38very very low - recc 6,000 u / day ( 2,000 u caps x3 = 6,000u/da)      Chol - 193 - hdl - 87 & ldl 92 - great      All else ok - depakote level is borderline high - send to [facility] counseling service to evaluate dosing and blood level              ------

## 2013-05-24 ENCOUNTER — Other Ambulatory Visit: Payer: Self-pay | Admitting: Internal Medicine

## 2013-05-24 DIAGNOSIS — Z1231 Encounter for screening mammogram for malignant neoplasm of breast: Secondary | ICD-10-CM

## 2013-05-30 ENCOUNTER — Ambulatory Visit (HOSPITAL_COMMUNITY): Payer: Medicare Other

## 2013-06-09 ENCOUNTER — Ambulatory Visit (INDEPENDENT_AMBULATORY_CARE_PROVIDER_SITE_OTHER): Payer: Medicare Other | Admitting: General Surgery

## 2013-06-14 ENCOUNTER — Ambulatory Visit (HOSPITAL_COMMUNITY)
Admission: RE | Admit: 2013-06-14 | Discharge: 2013-06-14 | Disposition: A | Discharge status: Home / Self Care | Payer: Medicare Other | Source: Ambulatory Visit | Attending: Internal Medicine | Admitting: Internal Medicine

## 2013-06-14 DIAGNOSIS — Z1231 Encounter for screening mammogram for malignant neoplasm of breast: Secondary | ICD-10-CM | POA: Insufficient documentation

## 2013-06-22 ENCOUNTER — Ambulatory Visit (INDEPENDENT_AMBULATORY_CARE_PROVIDER_SITE_OTHER): Payer: Medicare Other | Admitting: General Surgery

## 2013-07-11 ENCOUNTER — Telehealth (INDEPENDENT_AMBULATORY_CARE_PROVIDER_SITE_OTHER): Payer: Self-pay

## 2013-07-20 NOTE — Telephone Encounter (Signed)
Erroneous encounter

## 2013-08-01 ENCOUNTER — Ambulatory Visit: Payer: Self-pay | Admitting: Physician Assistant

## 2013-08-27 DIAGNOSIS — F419 Anxiety disorder, unspecified: Secondary | ICD-10-CM | POA: Insufficient documentation

## 2013-08-27 DIAGNOSIS — F329 Major depressive disorder, single episode, unspecified: Secondary | ICD-10-CM | POA: Insufficient documentation

## 2013-08-27 DIAGNOSIS — E119 Type 2 diabetes mellitus without complications: Secondary | ICD-10-CM | POA: Insufficient documentation

## 2013-08-27 DIAGNOSIS — E785 Hyperlipidemia, unspecified: Secondary | ICD-10-CM | POA: Insufficient documentation

## 2013-08-27 DIAGNOSIS — F32A Depression, unspecified: Secondary | ICD-10-CM | POA: Insufficient documentation

## 2013-08-29 ENCOUNTER — Encounter: Payer: Self-pay | Admitting: Physician Assistant

## 2013-08-29 ENCOUNTER — Ambulatory Visit (INDEPENDENT_AMBULATORY_CARE_PROVIDER_SITE_OTHER): Payer: Medicare Other | Admitting: Physician Assistant

## 2013-08-29 VITALS — BP 120/78 | HR 68 | Temp 97.9°F | Resp 16 | Wt 209.0 lb

## 2013-08-29 DIAGNOSIS — E119 Type 2 diabetes mellitus without complications: Secondary | ICD-10-CM

## 2013-08-29 DIAGNOSIS — N189 Chronic kidney disease, unspecified: Secondary | ICD-10-CM

## 2013-08-29 DIAGNOSIS — F319 Bipolar disorder, unspecified: Secondary | ICD-10-CM

## 2013-08-29 DIAGNOSIS — E559 Vitamin D deficiency, unspecified: Secondary | ICD-10-CM

## 2013-08-29 DIAGNOSIS — I10 Essential (primary) hypertension: Secondary | ICD-10-CM

## 2013-08-29 DIAGNOSIS — F329 Major depressive disorder, single episode, unspecified: Secondary | ICD-10-CM

## 2013-08-29 DIAGNOSIS — E785 Hyperlipidemia, unspecified: Secondary | ICD-10-CM

## 2013-08-29 DIAGNOSIS — F32A Depression, unspecified: Secondary | ICD-10-CM

## 2013-08-29 DIAGNOSIS — D649 Anemia, unspecified: Secondary | ICD-10-CM

## 2013-08-29 DIAGNOSIS — F1020 Alcohol dependence, uncomplicated: Secondary | ICD-10-CM

## 2013-08-29 DIAGNOSIS — E1121 Type 2 diabetes mellitus with diabetic nephropathy: Secondary | ICD-10-CM

## 2013-08-29 DIAGNOSIS — Z79899 Other long term (current) drug therapy: Secondary | ICD-10-CM

## 2013-08-29 DIAGNOSIS — IMO0001 Reserved for inherently not codable concepts without codable children: Secondary | ICD-10-CM

## 2013-08-29 DIAGNOSIS — Z0000 Encounter for general adult medical examination without abnormal findings: Secondary | ICD-10-CM

## 2013-08-29 LAB — BASIC METABOLIC PANEL WITH GFR
BUN: 13 mg/dL (ref 6–23)
CO2: 31 mEq/L (ref 19–32)
Calcium: 9.6 mg/dL (ref 8.4–10.5)
Chloride: 101 mEq/L (ref 96–112)
Creat: 1.02 mg/dL (ref 0.50–1.10)
GFR, EST AFRICAN AMERICAN: 73 mL/min
GFR, Est Non African American: 63 mL/min
GLUCOSE: 105 mg/dL — AB (ref 70–99)
POTASSIUM: 5.1 meq/L (ref 3.5–5.3)
Sodium: 137 mEq/L (ref 135–145)

## 2013-08-29 LAB — CBC WITH DIFFERENTIAL/PLATELET
Basophils Absolute: 0 10*3/uL (ref 0.0–0.1)
Basophils Relative: 0 % (ref 0–1)
Eosinophils Absolute: 0.2 10*3/uL (ref 0.0–0.7)
Eosinophils Relative: 4 % (ref 0–5)
HEMATOCRIT: 36.9 % (ref 36.0–46.0)
HEMOGLOBIN: 12.5 g/dL (ref 12.0–15.0)
Lymphocytes Relative: 39 % (ref 12–46)
Lymphs Abs: 2.1 10*3/uL (ref 0.7–4.0)
MCH: 28.5 pg (ref 26.0–34.0)
MCHC: 33.9 g/dL (ref 30.0–36.0)
MCV: 84.1 fL (ref 78.0–100.0)
MONO ABS: 0.4 10*3/uL (ref 0.1–1.0)
MONOS PCT: 8 % (ref 3–12)
NEUTROS ABS: 2.7 10*3/uL (ref 1.7–7.7)
Neutrophils Relative %: 49 % (ref 43–77)
Platelets: 254 10*3/uL (ref 150–400)
RBC: 4.39 MIL/uL (ref 3.87–5.11)
RDW: 15.9 % — ABNORMAL HIGH (ref 11.5–15.5)
WBC: 5.5 10*3/uL (ref 4.0–10.5)

## 2013-08-29 LAB — LIPID PANEL
Cholesterol: 185 mg/dL (ref 0–200)
HDL: 73 mg/dL (ref >39)
LDL CALC: 100 mg/dL — AB (ref 0–99)
TRIGLYCERIDES: 59 mg/dL (ref <150)
Total CHOL/HDL Ratio: 2.5 Ratio
VLDL: 12 mg/dL (ref 0–40)

## 2013-08-29 LAB — HEPATIC FUNCTION PANEL
AST: 12 U/L (ref 0–37)
Albumin: 3.7 g/dL (ref 3.5–5.2)
Alkaline Phosphatase: 54 U/L (ref 39–117)
BILIRUBIN INDIRECT: 0.4 mg/dL (ref 0.2–1.2)
Bilirubin, Direct: 0.1 mg/dL (ref 0.0–0.3)
TOTAL PROTEIN: 6.3 g/dL (ref 6.0–8.3)
Total Bilirubin: 0.5 mg/dL (ref 0.2–1.2)

## 2013-08-29 LAB — MAGNESIUM: Magnesium: 1.5 mg/dL (ref 1.5–2.5)

## 2013-08-29 MED ORDER — AMLODIPINE BESYLATE 5 MG PO TABS: 5.0000 mg | ORAL_TABLET | Freq: Every morning | ORAL | Status: DC

## 2013-08-29 MED ORDER — PRAVASTATIN SODIUM 20 MG PO TABS: 20.0000 mg | ORAL_TABLET | Freq: Every day | ORAL | Status: DC

## 2013-08-29 MED ORDER — METOPROLOL TARTRATE 25 MG PO TABS: 25.0000 mg | ORAL_TABLET | Freq: Two times a day (BID) | ORAL | Status: DC

## 2013-08-29 MED ORDER — METFORMIN HCL 500 MG PO TABS: 500.0000 mg | ORAL_TABLET | Freq: Two times a day (BID) | ORAL | Status: DC

## 2013-08-29 MED ORDER — TRAMADOL HCL 50 MG PO TABS: 50.0000 mg | ORAL_TABLET | Freq: Two times a day (BID) | ORAL | Status: DC | PRN

## 2013-08-29 NOTE — Patient Instructions (Signed)
Talk with your mom about taking you for a colonoscopy. Call the office back and we will refer you.   Preventative Care for Adults - Female      MAINTAIN REGULAR HEALTH EXAMS:  A routine yearly physical is a good way to check in with your primary care provider about your health and preventive screening. It is also an opportunity to share updates about your health and any concerns you have, and receive a thorough all-over exam.   Most health insurance companies pay for at least some preventative services.  Check with your health plan for specific coverages.  WHAT PREVENTATIVE SERVICES DO WOMEN NEED?  Adult women should have their weight and blood pressure checked regularly.   Women age 53 and older should have their cholesterol levels checked regularly.  Women should be screened for cervical cancer with a Pap smear and pelvic exam beginning at either age 53, or 3 years after they become sexually activity.    Breast cancer screening generally begins at age 53 with a mammogram and breast exam by your primary care provider.    Beginning at age 350 and continuing to age 53, women should be screened for colorectal cancer.  Certain people may need continued testing until age 53.  Updating vaccinations is part of preventative care.  Vaccinations help protect against diseases such as the flu.  Osteoporosis is a disease in which the bones lose minerals and strength as we age. Women ages 8065 and over should discuss this with their caregivers, as should women after menopause who have other risk factors.  Lab tests are generally done as part of preventative care to screen for anemia and blood disorders, to screen for problems with the kidneys and liver, to screen for bladder problems, to check blood sugar, and to check your cholesterol level.  Preventative services generally include counseling about diet, exercise, avoiding tobacco, drugs, excessive alcohol consumption, and sexually transmitted  infections.    GENERAL RECOMMENDATIONS FOR GOOD HEALTH:  Healthy diet:  Eat a variety of foods, including fruit, vegetables, animal or vegetable protein, such as meat, fish, chicken, and eggs, or beans, lentils, tofu, and grains, such as rice.  Drink plenty of water daily.  Decrease saturated fat in the diet, avoid lots of red meat, processed foods, sweets, fast foods, and fried foods.  Exercise:  Aerobic exercise helps maintain good heart health. At least 30-40 minutes of moderate-intensity exercise is recommended. For example, a brisk walk that increases your heart rate and breathing. This should be done on most days of the week.   Find a type of exercise or a variety of exercises that you enjoy so that it becomes a part of your daily life.  Examples are running, walking, swimming, water aerobics, and biking.  For motivation and support, explore group exercise such as aerobic class, spin class, Zumba, Yoga,or  martial arts, etc.    Set exercise goals for yourself, such as a certain weight goal, walk or run in a race such as a 5k walk/run.  Speak to your primary care provider about exercise goals.  Disease prevention:  If you smoke or chew tobacco, find out from your caregiver how to quit. It can literally save your life, no matter how long you have been a tobacco user. If you do not use tobacco, never begin.   Maintain a healthy diet and normal weight. Increased weight leads to problems with blood pressure and diabetes.   The Body Mass Index or BMI is a  way of measuring how much of your body is fat. Having a BMI above 27 increases the risk of heart disease, diabetes, hypertension, stroke and other problems related to obesity. Your caregiver can help determine your BMI and based on it develop an exercise and dietary program to help you achieve or maintain this important measurement at a healthful level.  High blood pressure causes heart and blood vessel problems.  Persistent high blood  pressure should be treated with medicine if weight loss and exercise do not work.   Fat and cholesterol leaves deposits in your arteries that can block them. This causes heart disease and vessel disease elsewhere in your body.  If your cholesterol is found to be high, or if you have heart disease or certain other medical conditions, then you may need to have your cholesterol monitored frequently and be treated with medication.   Ask if you should have a cardiac stress test if your history suggests this. A stress test is a test done on a treadmill that looks for heart disease. This test can find disease prior to there being a problem.  Menopause can be associated with physical symptoms and risks. Hormone replacement therapy is available to decrease these. You should talk to your caregiver about whether starting or continuing to take hormones is right for you.   Osteoporosis is a disease in which the bones lose minerals and strength as we age. This can result in serious bone fractures. Risk of osteoporosis can be identified using a bone density scan. Women ages 4 and over should discuss this with their caregivers, as should women after menopause who have other risk factors. Ask your caregiver whether you should be taking a calcium supplement and Vitamin D, to reduce the rate of osteoporosis.   Avoid drinking alcohol in excess (more than two drinks per day).  Avoid use of street drugs. Do not share needles with anyone. Ask for professional help if you need assistance or instructions on stopping the use of alcohol, cigarettes, and/or drugs.  Brush your teeth twice a day with fluoride toothpaste, and floss once a day. Good oral hygiene prevents tooth decay and gum disease. The problems can be painful, unattractive, and can cause other health problems. Visit your dentist for a routine oral and dental check up and preventive care every 6-12 months.   Look at your skin regularly.  Use a mirror to look at your  back. Notify your caregivers of changes in moles, especially if there are changes in shapes, colors, a size larger than a pencil eraser, an irregular border, or development of new moles.  Safety:  Use seatbelts 100% of the time, whether driving or as a passenger.  Use safety devices such as hearing protection if you work in environments with loud noise or significant background noise.  Use safety glasses when doing any work that could send debris in to the eyes.  Use a helmet if you ride a bike or motorcycle.  Use appropriate safety gear for contact sports.  Talk to your caregiver about gun safety.  Use sunscreen with a SPF (or skin protection factor) of 15 or greater.  Lighter skinned people are at a greater risk of skin cancer. Don't forget to also wear sunglasses in order to protect your eyes from too much damaging sunlight. Damaging sunlight can accelerate cataract formation.   Practice safe sex. Use condoms. Condoms are used for birth control and to help reduce the spread of sexually transmitted infections (or STIs).  Some of the STIs are gonorrhea (the clap), chlamydia, syphilis, trichomonas, herpes, HPV (human papilloma virus) and HIV (human immunodeficiency virus) which causes AIDS. The herpes, HIV and HPV are viral illnesses that have no cure. These can result in disability, cancer and death.   Keep carbon monoxide and smoke detectors in your home functioning at all times. Change the batteries every 6 months or use a model that plugs into the wall.   Vaccinations:  Stay up to date with your tetanus shots and other required immunizations. You should have a booster for tetanus every 10 years. Be sure to get your flu shot every year, since 5%-20% of the [place] population comes down with the flu. The flu vaccine changes each year, so being vaccinated once is not enough. Get your shot in the fall, before the flu season peaks.   Other vaccines to consider:  Human Papilloma Virus or HPV causes  cancer of the cervix, and other infections that can be transmitted from person to person. There is a vaccine for HPV, and females should get immunized between the ages of 7111 and 2326. It requires a series of 3 shots.   Pneumococcal vaccine to protect against certain types of pneumonia.  This is normally recommended for adults age 53 or older.  However, adults younger than 53 years old with certain underlying conditions such as diabetes, heart or lung disease should also receive the vaccine.  Shingles vaccine to protect against Varicella Zoster if you are older than age 53, or younger than 53 years old with certain underlying illness.  Hepatitis A vaccine to protect against a form of infection of the liver by a virus acquired from food.  Hepatitis B vaccine to protect against a form of infection of the liver by a virus acquired from blood or body fluids, particularly if you work in health care.  If you plan to travel internationally, check with your local health department for specific vaccination recommendations.  Cancer Screening:  Breast cancer screening is essential to preventive care for women. All women age 53 and older should perform a breast self-exam every month. At age 53 and older, women should have their caregiver complete a breast exam each year. Women at ages 240 and older should have a mammogram (x-ray film) of the breasts. Your caregiver can discuss how often you need mammograms.    Cervical cancer screening includes taking a Pap smear (sample of cells examined under a microscope) from the cervix (end of the uterus). It also includes testing for HPV (Human Papilloma Virus, which can cause cervical cancer). Screening and a pelvic exam should begin at age 53, or 3 years after a woman becomes sexually active. Screening should occur every year, with a Pap smear but no HPV testing, up to age 53. After age 53, you should have a Pap smear every 3 years with HPV testing, if no HPV was found  previously.   Most routine colon cancer screening begins at the age of 53. On a yearly basis, doctors may provide special easy to use take-home tests to check for hidden blood in the stool. Sigmoidoscopy or colonoscopy can detect the earliest forms of colon cancer and is life saving. These tests use a small camera at the end of a tube to directly examine the colon. Speak to your caregiver about this at age 53, when routine screening begins (and is repeated every 5 years unless early forms of pre-cancerous polyps or small growths are found).

## 2013-08-29 NOTE — Progress Notes (Signed)
Assessment:   1. Hypertension - CBC with Differential - BASIC METABOLIC PANEL WITH GFR - Hepatic function panel - TSH  2. Type II or unspecified type diabetes mellitus without mention of complication, not stated as uncontrolled Discussed general issues about diabetes pathophysiology and management., Educational material distributed., Suggested low cholesterol diet., Encouraged aerobic exercise., Discussed foot care., Reminded to get yearly retinal exam. - Hemoglobin A1c  3. Chronic kidney disease Check BMP  4. Left knee pain -tramadol #60 NR, discussed PT declines at this time, discussed tramadol and if she over uses she will not get any more   5. Morbid obesity S/p lap band Still has problems with eating poorly but eating less. Discussed good carbs/bad carbs  6. Alcoholism /alcohol abuse States not drinking  7. Bipolar disorder Cont follow up  8. Anemia Check cbc  9. Depression Cont follow up  10. Bipolar disorder, unspecified Cont follow up with [facility]  11. Other and unspecified hyperlipidemia - Lipid panel  12. Unspecified vitamin D deficiency - Vit D  25 hydroxy (rtn osteoporosis monitoring)  13. Encounter for long-term (current) use of other medications - Magnesium   Plan:   During the course of the visit the patient was educated and counseled about appropriate screening and preventive services including:    Pneumococcal vaccine   Influenza vaccine  Td vaccine  Screening electrocardiogram  Screening mammography  Screening Pap smear and pelvic exam   Bone densitometry screening  Colorectal cancer screening  Diabetes screening  Glaucoma screening  Nutrition counseling   Screening recommendations, referrals:  Vaccinations: Tdap vaccine not indicated Influenza vaccine not indicated Pneumococcal vaccine not indicated Shingles vaccine not indicated Hep B vaccine not indicated  Nutrition assessed and recommended  Colonoscopy  requested Mammogram up to date Pap smear not indicated Pelvic exam not indicated Recommended yearly ophthalmology/optometry visit for glaucoma screening and checkup Recommended yearly dental visit for hygiene and checkup Advanced directives - requested  Conditions/risks identified: BMI: Discussed weight loss, diet, and increase physical activity.  Increase physical activity: AHA recommends 150 minutes of physical activity a week.  Medications reviewed DEXA- not indicated Diabetes is not at goal, ACE/ARB therapy: Yes. Urinary Incontinence is not an issue: discussed non pharmacology and pharmacology options.  Fall risk: moderate- discussed PT, home fall assessment, medications.    Subjective:   Lynn Palmer is a 53 y.o. female who presents for Medicare Annual Wellness Visit and 3 month follow up on hypertension, diabetes, hyperlipidemia, vitamin D def.  Date of last medicare wellness visit is unknown.   Her blood pressure has been controlled at home, today their BP is BP: 120/78 mmHg She does not workout. She denies chest pain, shortness of breath, dizziness.  She is on cholesterol medication and denies myalgias. Her cholesterol is at goal. The cholesterol last visit was:   Lab Results  Component Value Date   CHOL 193 04/28/2013   HDL 87 04/28/2013   LDLCALC 92 04/28/2013   TRIG 72 04/28/2013   CHOLHDL 2.2 04/28/2013   She has been working on diet and exercise for diabetes with CKD, and denies paresthesia of the feet, polydipsia and polyuria. Last A1C in the office was:  Lab Results  Component Value Date   HGBA1C 5.9* 04/28/2013   Patient is on Vitamin D supplement. She has a history of polysubstance abuse, she has left knee pain, aleve helps but she has taken tramadol in the past and would like some more. We can  She sees [facility] for  bipolar disorder and she states she is feeling better being off seroquel but still struggling with her weight.   Names of Other  Physician/Practitioners you currently use: 1. [facility]- here for primary care 2. Dr. Nile Riggsshapiro, eye doctor, last visit 1 year 3. Dr. Johna SheriffHoxworth- s/p Lap band Patient Care Team: Lucky CowboyWilliam McKeown, MD as PCP - General (Internal Medicine)  Medication Review Current Outpatient Prescriptions on File Prior to Visit  Medication Sig Dispense Refill  . acetaminophen (TYLENOL) 500 MG tablet Take 2 tablets (1,000 mg total) by mouth every 6 (six) hours as needed for pain.  30 tablet  0  . amLODipine (NORVASC) 5 MG tablet Take 1 tablet (5 mg total) by mouth every morning. For hypertension  30 tablet  0  . divalproex (DEPAKOTE ER) 500 MG 24 hr tablet Take 1 tablet (500 mg ) Q daily and 2 tablets (1000 mg) Q bedtime: For mood stabilization  30 tablet  0  . gabapentin (NEURONTIN) 100 MG capsule Take 200 mg by mouth 2 (two) times daily. For anxiety/pain management takes 1 cap BID and 2 caps at HS      . ibuprofen (ADVIL,MOTRIN) 200 MG tablet Take 4 tablets (800 mg total) by mouth every 6 (six) hours as needed for pain.  30 tablet  0  . metFORMIN (GLUCOPHAGE) 500 MG tablet Take 1 tablet (500 mg total) by mouth 2 (two) times daily. For diabetes control  60 tablet  0  . metoprolol tartrate (LOPRESSOR) 25 MG tablet Take 25 mg by mouth 2 (two) times daily.      . pravastatin (PRAVACHOL) 20 MG tablet Take 20 mg by mouth daily.      . QUEtiapine (SEROQUEL) 100 MG tablet Take 100 mg by mouth at bedtime.      . traZODone (DESYREL) 100 MG tablet Take 1 tablet (100 mg total) by mouth at bedtime. For sleep  30 tablet  0  . [DISCONTINUED] sertraline (ZOLOFT) 100 MG tablet Take 100 mg by mouth 2 (two) times daily.        No current facility-administered medications on file prior to visit.    Current Problems (verified) Patient Active Problem List   Diagnosis Date Noted  . Type II or unspecified type diabetes mellitus without mention of complication, not stated as uncontrolled   .  Depression   . Hyperlipidemia   . Anxiety   . Chronic kidney disease 11/11/2012  . Diabetic nephropathy 11/11/2012  . Alcohol dependence 08/04/2012  . Anemia 03/15/2012  . Bipolar disorder 06/19/2011    Class: Acute  . Back pain, lumbosacral 06/17/2011    Class: Chronic  . Polysubstance abuse 06/14/2011  . Morbid obesity 11/21/2010  . Hypertension 11/21/2010  . Alcoholism /alcohol abuse 11/21/2010    Screening Tests Health Maintenance  Topic Date Due  . Colonoscopy  11/01/2010  . Pap Smear  05/10/2012  . Influenza Vaccine  11/11/2013  . Mammogram  06/15/2015  . Tetanus/tdap  12/17/2021     Immunization History  Administered Date(s) Administered  . Influenza Split 03/16/2012  . Pneumococcal Polysaccharide-23 03/16/2012  . Tdap 12/18/2011    Preventative care: Last colonoscopy: Never- willing to get, will ask if her mom will go with her and will call back Last mammogram:06/2013 Last pap smear/pelvic exam: 2011- will get at [facility] DEXA:NA  Prior vaccinations: TD or Tdap: 2013  Influenza: 2013 Pneumococcal: 2013 Shingles/Zostavax: not indicated at this time  History reviewed: allergies, current medications, past family history, past  medical history, past social history, past surgical history and problem list  Risk Factors: Osteoporosis: postmenopausal estrogen deficiency and dietary calcium and/or vitamin D deficiency History of fracture in the past year: no  Tobacco History  Substance Use Topics  . Smoking status: Never Smoker   . Smokeless tobacco: Never Used  . Alcohol Use: No     Comment: social/ denies present use   She does not smoke.  Patient is not a former smoker. Are there smokers in your home (other than you)?  No  Alcohol Current alcohol use: none  Caffeine Current caffeine use: denies use  Exercise Current exercise: none  Nutrition/Diet Current diet: in general, an "unhealthy" diet, she is eating less due to the lap band  Cardiac risk  factors: hypertension, obesity (BMI >= 30 kg/m2) and sedentary lifestyle.  Depression Screen (Note: if answer to either of the following is "Yes", a more complete depression screening is indicated)   Q1: Over the past two weeks, have you felt down, depressed or hopeless? Yes  Q2: Over the past two weeks, have you felt little interest or pleasure in doing things? No  Have you lost interest or pleasure in daily life? No  Do you often feel hopeless? No  Do you cry easily over simple problems? No  Activities of Daily Living In your present state of health, do you have any difficulty performing the following activities?:  Driving? No Managing money?  No Feeding yourself? No Getting from bed to chair? No Climbing a flight of stairs? No Preparing food and eating?: No Bathing or showering? No Getting dressed: No Getting to the toilet? No Using the toilet:No Moving around from place to place: Yes due to knee pain In the past year have you fallen or had a near fall?:Yes states she trips occ   Are you sexually active?  No  Do you have more than one partner?  Yes  Vision Difficulties: No  Hearing Difficulties: No Do you often ask people to speak up or repeat themselves? No Do you experience ringing or noises in your ears? No Do you have difficulty understanding soft or whispered voices? No  Cognition  Do you feel that you have a problem with memory?No  Do you often misplace items? No  Do you feel safe at home?  Yes  Advanced directives Does patient have a Health Care Power of Attorney? Yes- mother Does patient have a Living Will? Yes   Objective:   Blood pressure 120/78, pulse 68, temperature 97.9 F (36.6 C), resp. rate 16, weight 209 lb (94.802 kg). Body mass index is 39.51 kg/(m^2).  General appearance: alert, no distress, WD/WN,  female Cognitive Testing  Alert? Yes  Normal Appearance?Yes  Oriented to person? Yes  Place? Yes   Time? Yes  Recall of three objects?   Yes  Can perform simple calculations? Yes  Displays appropriate judgment?Yes  Can read the correct time from a watch face?Yes  HEENT: normocephalic, sclerae anicteric, TMs pearly, nares patent, no discharge or erythema, pharynx normal Oral cavity: MMM, no lesions Neck: supple, no lymphadenopathy, no thyromegaly, no masses Heart: RRR, normal S1, S2, no murmurs Lungs: CTA bilaterally, no wheezes, rhonchi, or rales Abdomen: +bs, soft, non tender, non distended, no masses, no hepatomegaly, no splenomegaly Musculoskeletal: tender left medial knee pain, no swelling, no obvious deformity, + crepitus Extremities: no edema, no cyanosis, no clubbing Pulses: 2+ symmetric, upper and lower extremities, normal cap refill Neurological: alert, oriented x 3, CN2-12 intact, strength normal  upper extremities and lower extremities, sensation normal throughout, DTRs 2+ throughout, no cerebellar signs, gait antalgic Psychiatric: normal affect, behavior normal, pleasant  Breast: defer Gyn: defer Rectal: defer  Medicare Attestation I have personally reviewed: The patient's medical and social history Their use of alcohol, tobacco or illicit drugs Their current medications and supplements The patient's functional ability including ADLs,fall risks, home safety risks, cognitive, and hearing and visual impairment Diet and physical activities Evidence for depression or mood disorders  The patient's weight, height, BMI, and visual acuity have been recorded in the chart.  I have made referrals, counseling, and provided education to the patient based on review of the above and I have provided the patient with a written personalized care plan for preventive services.     Quentin Mulling, PA-C   08/29/2013

## 2013-08-30 LAB — HEMOGLOBIN A1C
Hgb A1c MFr Bld: 5.7 % — ABNORMAL HIGH (ref <5.7)
MEAN PLASMA GLUCOSE: 117 mg/dL — AB (ref <117)

## 2013-08-30 LAB — TSH: TSH: 2.5 u[IU]/mL (ref 0.350–4.500)

## 2013-08-30 LAB — VITAMIN D 25 HYDROXY (VIT D DEFICIENCY, FRACTURES): Vit D, 25-Hydroxy: 45 ng/mL (ref 30–89)

## 2013-09-13 ENCOUNTER — Other Ambulatory Visit: Payer: Self-pay | Admitting: Physician Assistant

## 2013-09-13 NOTE — Telephone Encounter (Signed)
Please call her pharmacy and see if she ever filled the Tramadol. I think she filled 60 tramadol on 08/31/13 and she is out. If that is the case we are going to refuse.

## 2013-09-13 NOTE — Telephone Encounter (Signed)
Called patients pharmacy @ [phone] per pharmacy employee [facility] she states that the patient was mailed Tramadol 50 mg, #60 on 09/01/13 Quentin Mulling, PA made aware.

## 2013-09-23 ENCOUNTER — Encounter (HOSPITAL_COMMUNITY): Payer: Self-pay | Admitting: Emergency Medicine

## 2013-09-23 ENCOUNTER — Emergency Department (HOSPITAL_COMMUNITY)
Admission: EM | Admit: 2013-09-23 | Discharge: 2013-09-24 | Discharge status: Against Medical Advice | Payer: Medicare Other | Attending: Emergency Medicine | Admitting: Emergency Medicine

## 2013-09-23 DIAGNOSIS — E669 Obesity, unspecified: Secondary | ICD-10-CM | POA: Insufficient documentation

## 2013-09-23 DIAGNOSIS — S46909A Unspecified injury of unspecified muscle, fascia and tendon at shoulder and upper arm level, unspecified arm, initial encounter: Secondary | ICD-10-CM | POA: Insufficient documentation

## 2013-09-23 DIAGNOSIS — S8990XA Unspecified injury of unspecified lower leg, initial encounter: Secondary | ICD-10-CM | POA: Insufficient documentation

## 2013-09-23 DIAGNOSIS — S99919A Unspecified injury of unspecified ankle, initial encounter: Secondary | ICD-10-CM

## 2013-09-23 DIAGNOSIS — S99929A Unspecified injury of unspecified foot, initial encounter: Secondary | ICD-10-CM

## 2013-09-23 DIAGNOSIS — IMO0002 Reserved for concepts with insufficient information to code with codable children: Secondary | ICD-10-CM | POA: Insufficient documentation

## 2013-09-23 DIAGNOSIS — E119 Type 2 diabetes mellitus without complications: Secondary | ICD-10-CM | POA: Insufficient documentation

## 2013-09-23 DIAGNOSIS — I10 Essential (primary) hypertension: Secondary | ICD-10-CM | POA: Insufficient documentation

## 2013-09-23 DIAGNOSIS — S4980XA Other specified injuries of shoulder and upper arm, unspecified arm, initial encounter: Secondary | ICD-10-CM | POA: Insufficient documentation

## 2013-09-23 DIAGNOSIS — Y9389 Activity, other specified: Secondary | ICD-10-CM | POA: Insufficient documentation

## 2013-09-23 DIAGNOSIS — W010XXA Fall on same level from slipping, tripping and stumbling without subsequent striking against object, initial encounter: Secondary | ICD-10-CM | POA: Insufficient documentation

## 2013-09-23 DIAGNOSIS — Y9289 Other specified places as the place of occurrence of the external cause: Secondary | ICD-10-CM | POA: Insufficient documentation

## 2013-09-23 LAB — CBC
HCT: 36.5 % (ref 36.0–46.0)
Hemoglobin: 11.8 g/dL — ABNORMAL LOW (ref 12.0–15.0)
MCH: 28.5 pg (ref 26.0–34.0)
MCHC: 32.3 g/dL (ref 30.0–36.0)
MCV: 88.2 fL (ref 78.0–100.0)
PLATELETS: 256 10*3/uL (ref 150–400)
RBC: 4.14 MIL/uL (ref 3.87–5.11)
RDW: 15.1 % (ref 11.5–15.5)
WBC: 6 10*3/uL (ref 4.0–10.5)

## 2013-09-23 LAB — CBG MONITORING, ED: Glucose-Capillary: 136 mg/dL — ABNORMAL HIGH (ref 70–99)

## 2013-09-23 NOTE — ED Notes (Signed)
Pt arrived to the ED with a complaint of a fall.  Pt tripped over some toys and fell down a flight of stairs.  Pt states she had LOC for about 15 minutes.  Pt states she has pain on her buttocks toes, and left arm.

## 2013-09-24 LAB — BASIC METABOLIC PANEL
BUN: 20 mg/dL (ref 6–23)
CALCIUM: 9.2 mg/dL (ref 8.4–10.5)
CO2: 26 mEq/L (ref 19–32)
Chloride: 100 mEq/L (ref 96–112)
Creatinine, Ser: 0.9 mg/dL (ref 0.50–1.10)
GFR calc Af Amer: 84 mL/min — ABNORMAL LOW (ref >90)
GFR, EST NON AFRICAN AMERICAN: 72 mL/min — AB (ref >90)
GLUCOSE: 138 mg/dL — AB (ref 70–99)
Potassium: 4.3 mEq/L (ref 3.7–5.3)
Sodium: 140 mEq/L (ref 137–147)

## 2013-09-24 LAB — URINALYSIS, ROUTINE W REFLEX MICROSCOPIC
BILIRUBIN URINE: NEGATIVE
Glucose, UA: NEGATIVE mg/dL
Hgb urine dipstick: NEGATIVE
KETONES UR: NEGATIVE mg/dL
LEUKOCYTES UA: NEGATIVE
NITRITE: NEGATIVE
PROTEIN: NEGATIVE mg/dL
Specific Gravity, Urine: 1.003 — ABNORMAL LOW (ref 1.005–1.030)
Urobilinogen, UA: 1 mg/dL (ref 0.0–1.0)
pH: 6.5 (ref 5.0–8.0)

## 2013-09-24 MED ORDER — IBUPROFEN 200 MG PO TABS
400.0000 mg | ORAL_TABLET | Freq: Once | ORAL | Status: AC
  Administered 2013-09-24: 400 mg via ORAL
  Filled 2013-09-24: qty 2

## 2013-09-24 NOTE — ED Notes (Signed)
Patient is in the hall upset about not being seen and that she is not paying for this visit. Attending Dr. Lavella LemonsManly informed that patient is leaving AMA.

## 2013-09-24 NOTE — ED Notes (Signed)
Patient fell down a flight of steps and denies hitting her head.

## 2013-09-28 ENCOUNTER — Ambulatory Visit (HOSPITAL_COMMUNITY)
Admission: RE | Admit: 2013-09-28 | Discharge: 2013-09-28 | Disposition: A | Discharge status: Home / Self Care | Payer: Medicare Other | Source: Ambulatory Visit | Attending: Physician Assistant | Admitting: Physician Assistant

## 2013-09-28 ENCOUNTER — Ambulatory Visit (INDEPENDENT_AMBULATORY_CARE_PROVIDER_SITE_OTHER): Payer: Medicare Other | Admitting: Physician Assistant

## 2013-09-28 ENCOUNTER — Encounter: Payer: Self-pay | Admitting: Physician Assistant

## 2013-09-28 VITALS — BP 134/90 | HR 76 | Temp 97.2°F | Resp 16

## 2013-09-28 DIAGNOSIS — M25559 Pain in unspecified hip: Secondary | ICD-10-CM | POA: Insufficient documentation

## 2013-09-28 DIAGNOSIS — M79674 Pain in right toe(s): Secondary | ICD-10-CM

## 2013-09-28 DIAGNOSIS — G894 Chronic pain syndrome: Secondary | ICD-10-CM

## 2013-09-28 DIAGNOSIS — W108XXA Fall (on) (from) other stairs and steps, initial encounter: Secondary | ICD-10-CM

## 2013-09-28 DIAGNOSIS — M25552 Pain in left hip: Secondary | ICD-10-CM

## 2013-09-28 DIAGNOSIS — F411 Generalized anxiety disorder: Secondary | ICD-10-CM

## 2013-09-28 DIAGNOSIS — M79609 Pain in unspecified limb: Secondary | ICD-10-CM

## 2013-09-28 MED ORDER — TRAMADOL HCL 50 MG PO TABS: 50.0000 mg | ORAL_TABLET | Freq: Three times a day (TID) | ORAL | Status: DC | PRN

## 2013-09-28 MED ORDER — HYDROXYZINE HCL 10 MG PO TABS: 10.0000 mg | ORAL_TABLET | Freq: Three times a day (TID) | ORAL | Status: DC | PRN

## 2013-09-28 NOTE — Patient Instructions (Signed)
Toe Fracture Your caregiver has diagnosed you as having a fractured toe. A toe fracture is a break in the bone of a toe. "Buddy taping" is a way of splinting your broken toe, by taping the broken toe to the toe next to it. This "buddy taping" will keep the injured toe from moving beyond normal range of motion. Buddy taping also helps the toe heal in a more normal alignment. It may take 6 to 8 weeks for the toe injury to heal. HOME CARE INSTRUCTIONS   Leave your toes taped together for as long as directed by your caregiver or until you see a doctor for a follow-up examination. You can change the tape after bathing. Always use a small piece of gauze or cotton between the toes when taping them together. This will help the skin stay dry and prevent infection.  Apply ice to the injury for 15-20 minutes each hour while awake for the first 2 days. Put the ice in a plastic bag and place a towel between the bag of ice and your skin.  After the first 2 days, apply heat to the injured area. Use heat for the next 2 to 3 days. Place a heating pad on the foot or soak the foot in warm water as directed by your caregiver.  Keep your foot elevated as much as possible to lessen swelling.  Wear sturdy, supportive shoes. The shoes should not pinch the toes or fit tightly against the toes.  Your caregiver may prescribe a rigid shoe if your foot is very swollen.  Your may be given crutches if the pain is too great and it hurts too much to walk.  Only take over-the-counter or prescription medicines for pain, discomfort, or fever as directed by your caregiver.  If your caregiver has given you a follow-up appointment, it is very important to keep that appointment. Not keeping the appointment could result in a chronic or permanent injury, pain, and disability. If there is any problem keeping the appointment, you must call back to this facility for assistance. SEEK MEDICAL CARE IF:   You have increased pain or swelling,  not relieved with medications.  The pain does not get better after 1 week.  Your injured toe is cold when the others are warm. SEEK IMMEDIATE MEDICAL CARE IF:   The toe becomes cold, numb, or white.  The toe becomes hot (inflamed) and red. Document Released: 03/27/2000 Document Revised: 06/22/2011 Document Reviewed: 11/14/2007 [facility] Patient Information 2015 [place], [place]. This information is not intended to replace advice given to you by your health care Lynn Palmer. Make sure you discuss any questions you have with your health care Lynn Palmer.  Concussion A concussion is a brain injury. It is caused by:  A hit to the head.  A quick and sudden movement (jolt) of the head or neck. A concussion is usually not life-threatening. Even so, it can cause serious problems. If you had a concussion before, you may have concussion-like problems after a hit to your head. HOME CARE General Instructions  Follow your doctor's directions carefully.  Take medicines only as told by your doctor.  Only take medicines your doctor says are safe.  Do not drink alcohol until your doctor says it is okay. Alcohol and some drugs can slow down healing. They can also put you at risk for further injury.  If you are having trouble remembering things, write them down.  Try to do one thing at a time if you get distracted easily.  For example, do not watch TV while making dinner.  Talk to your family members or close friends when making important decisions.  Follow up with your doctor as told.  Watch your symptoms. Tell others to do the same. Serious problems can sometimes happen after a concussion. Older adults are more likely to have these problems.  Tell your teachers, school nurse, school counselor, coach, Event organiserathletic trainer, or work Production designer, theatre/television/filmmanager about your concussion. Tell them about what you can or cannot do. They should watch to see if:  It gets even harder for you to pay attention or concentrate.  It gets  even harder for you to remember things or learn new things.  You need more time than normal to finish things.  You become annoyed (irritable) more than before.  You are not able to deal with stress as well.  You have more problems than before.  Rest. Make sure you:  Get plenty of sleep at night.  Go to sleep early.  Go to bed at the same time every day. Try to wake up at the same time.  Rest during the day.  Take naps when you feel tired.  Limit activities where you have to think a lot or concentrate. These include:  Doing homework.  Doing work related to a job.  Watching TV.  Using the computer. Returning To Your Regular Activities Return to your normal activities slowly, not all at once. You must give your body and brain enough time to heal.   Do not play sports or do other athletic activities until your doctor says it is okay.  Ask your doctor when you can drive, ride a bicycle, or work other vehicles or machines. Never do these things if you feel dizzy.  Ask your doctor about when you can return to work or school. Preventing Another Concussion It is very important to avoid another brain injury, especially before you have healed. In rare cases, another injury can lead to permanent brain damage, brain swelling, or death. The risk of this is greatest during the first 7-10 days after your injury. Avoid injuries by:   Wearing a seat belt when riding in a car.  Not drinking too much alcohol.  Avoiding activities that could lead to a second concussion (such as contact sports).  Wearing a helmet when doing activities like:  Biking.  Skiing.  Skateboarding.  Skating.  Making your home safer by:  Removing things from the floor or stairways that could make you trip.  Using grab bars in bathrooms and handrails by stairs.  Placing non-slip mats on floors and in bathtubs.  Improve lighting in dark areas. GET HELP IF:  It gets even harder for you to pay  attention or concentrate.  It gets even harder for you to remember things or learn new things.  You need more time than normal to finish things.  You become annoyed (irritable) more than before.  You are not able to deal with stress as well.  You have more problems than before.  You have problems keeping your balance.  You are not able to react quickly when you should. Get help if you have any of these problems for more than 2 weeks:   Lasting (chronic) headaches.  Dizziness or trouble balancing.  Feeling sick to your stomach (nausea).  Seeing (vision) problems.  Being affected by noises or light more than normal.  Feeling sad, low, down in the dumps, blue, gloomy, or empty (depressed).  Mood changes (mood swings).  Feeling of fear or nervousness about what may happen (anxiety).  Feeling annoyed.  Memory problems.  Problems concentrating or paying attention.  Sleep problems.  Feeling tired all the time. GET HELP RIGHT AWAY IF:   You have bad headaches or your headaches get worse.  You have weakness (even if it is in one hand, leg, or part of the face).  You have loss of feeling (numbness).  You feel off balance.  You keep throwing up (vomiting).  You feel tired.  One black center of your eye (pupil) is larger than the other.  You twitch or shake violently (convulse).  Your speech is not clear (slurred).  You are more confused, easily angered (agitated), or annoyed than before.  You have more trouble resting than before.  You are unable to recognize people or places.  You have neck pain.  It is difficult to wake you up.  You have unusual behavior changes.  You pass out (lose consciousness). MAKE SURE YOU:   Understand these instructions.  Will watch your condition.  Will get help right away if you are not doing well or get worse. Document Released: 03/18/2009 Document Revised: 04/04/2013 Document Reviewed: 10/20/2012 [facility] Patient  Information 2015 [place], [place]. This information is not intended to replace advice given to you by your health care Lynn Palmer. Make sure you discuss any questions you have with your health care Lynn Palmer.

## 2013-09-28 NOTE — Progress Notes (Signed)
   Subjective:    Patient ID: Lynn Palmer, female    DOB: Mar 19, 1961, [age] y.o.   MRN: [id number]  HPI 53 y.o. female states she fell [facility] a flight of [facility]  on 09/22/2013, she went to the ER but she states they did nothing. She states possible LOC at the end of the stairs for a few seconds. She has been having dizziness, nausea but she states she has this all the time since s/p Lap Band. She has history of degenerative discs in her lower back and OA. We have told her here in the past we will not continuly fill pain medications, she has not been abusing her tramadol. She is out of tramadol and states that she would like to try to go to a pain clinic for chronic pain.  She is crying currently, stating she is "so stressed out she thinks she will die". She is staying with her daughter for 62200$ a month and states that she cant go up the stairs, her daughter expects her to clean, baby sit, and there is always yelling. She follows with [facility] for bipolar depression and she has a history of polysubstance abuse. We will refer to Dr. Simone CuriaHagy.    Review of Systems  Constitutional: Negative.   HENT: Negative.   Respiratory: Negative.   Cardiovascular: Negative.   Gastrointestinal: Positive for nausea. Negative for vomiting, abdominal pain, diarrhea, abdominal distention, anal bleeding and rectal pain.  Genitourinary: Negative.   Musculoskeletal: Positive for arthralgias, gait problem and myalgias.  Skin: Negative.   Neurological: Positive for dizziness. Negative for tremors, seizures, syncope, facial asymmetry, speech difficulty, weakness, light-headedness, numbness and headaches.  Psychiatric/Behavioral: Positive for sleep disturbance. Negative for suicidal ideas and hallucinations. The patient is nervous/anxious.        Objective:   Physical Exam HEENT: normocephalic, sclerae anicteric, TMs pearly, nares patent, no discharge or erythema, pharynx normal Oral cavity: MMM, no  lesions Neck: supple, no lymphadenopathy, no thyromegaly, no masses Heart: RRR, normal S1, S2, no murmurs Lungs: CTA bilaterally, no wheezes, rhonchi, or rales Abdomen: +bs, soft, non tender, non distended, no masses, no hepatomegaly, no splenomegaly Musculoskeletal: tender left medial knee pain, no swelling, no obvious deformity, + crepitus, Left hip with pain with flexion. Right 2nd toe with swelling, erythema, tenderness.  Extremities: no edema, no cyanosis, no clubbing Pulses: 2+ symmetric, upper and lower extremities, normal cap refill Skin: Left lateral hip with healing ecchymosis, right lower back healing eccymosis Neurological: alert, oriented x 3, CN2-12 intact, strength normal upper extremities and lower extremities, sensation normal throughout, DTRs 2+ throughout, no cerebellar signs, gait antalgic Psychiatric: crying denies suicidal/homicidial ideations      Assessment & Plan:  Anxiety- will not fill benzo, will give atarax for time being to take PRN, suggest follow up with [facility] Chronic pain- refer to pain management- tramadol refill will do #90 since fall S/p Fall normal neuro exam- will get left hip xray, possible broken right 2nd toe but foot is okay, buddy taped and she will RICE.

## 2013-10-05 ENCOUNTER — Emergency Department (EMERGENCY_DEPARTMENT_HOSPITAL)
Admission: EM | Admit: 2013-10-05 | Discharge: 2013-10-06 | Disposition: A | Discharge status: Home / Self Care | Payer: 59 | Source: Home / Self Care

## 2013-10-05 ENCOUNTER — Encounter (HOSPITAL_COMMUNITY): Payer: Self-pay | Admitting: Emergency Medicine

## 2013-10-05 DIAGNOSIS — R45851 Suicidal ideations: Secondary | ICD-10-CM

## 2013-10-05 DIAGNOSIS — Z8249 Family history of ischemic heart disease and other diseases of the circulatory system: Secondary | ICD-10-CM

## 2013-10-05 DIAGNOSIS — E119 Type 2 diabetes mellitus without complications: Secondary | ICD-10-CM | POA: Insufficient documentation

## 2013-10-05 DIAGNOSIS — E7800 Pure hypercholesterolemia, unspecified: Secondary | ICD-10-CM | POA: Diagnosis present

## 2013-10-05 DIAGNOSIS — E785 Hyperlipidemia, unspecified: Secondary | ICD-10-CM

## 2013-10-05 DIAGNOSIS — F419 Anxiety disorder, unspecified: Secondary | ICD-10-CM | POA: Diagnosis present

## 2013-10-05 DIAGNOSIS — F411 Generalized anxiety disorder: Secondary | ICD-10-CM

## 2013-10-05 DIAGNOSIS — F319 Bipolar disorder, unspecified: Secondary | ICD-10-CM | POA: Diagnosis present

## 2013-10-05 DIAGNOSIS — F329 Major depressive disorder, single episode, unspecified: Secondary | ICD-10-CM

## 2013-10-05 DIAGNOSIS — G4700 Insomnia, unspecified: Secondary | ICD-10-CM | POA: Diagnosis present

## 2013-10-05 DIAGNOSIS — I10 Essential (primary) hypertension: Secondary | ICD-10-CM | POA: Diagnosis present

## 2013-10-05 DIAGNOSIS — F4310 Post-traumatic stress disorder, unspecified: Secondary | ICD-10-CM | POA: Diagnosis present

## 2013-10-05 DIAGNOSIS — F1210 Cannabis abuse, uncomplicated: Secondary | ICD-10-CM | POA: Diagnosis present

## 2013-10-05 DIAGNOSIS — E669 Obesity, unspecified: Secondary | ICD-10-CM

## 2013-10-05 DIAGNOSIS — F314 Bipolar disorder, current episode depressed, severe, without psychotic features: Secondary | ICD-10-CM

## 2013-10-05 DIAGNOSIS — Z79899 Other long term (current) drug therapy: Secondary | ICD-10-CM

## 2013-10-05 DIAGNOSIS — F1021 Alcohol dependence, in remission: Secondary | ICD-10-CM

## 2013-10-05 DIAGNOSIS — F32A Depression, unspecified: Secondary | ICD-10-CM

## 2013-10-05 DIAGNOSIS — F3130 Bipolar disorder, current episode depressed, mild or moderate severity, unspecified: Secondary | ICD-10-CM | POA: Diagnosis not present

## 2013-10-05 DIAGNOSIS — F1994 Other psychoactive substance use, unspecified with psychoactive substance-induced mood disorder: Secondary | ICD-10-CM | POA: Diagnosis present

## 2013-10-05 LAB — CBC
HCT: 38.1 % (ref 36.0–46.0)
Hemoglobin: 12.5 g/dL (ref 12.0–15.0)
MCH: 28.5 pg (ref 26.0–34.0)
MCHC: 32.8 g/dL (ref 30.0–36.0)
MCV: 86.8 fL (ref 78.0–100.0)
Platelets: 265 10*3/uL (ref 150–400)
RBC: 4.39 MIL/uL (ref 3.87–5.11)
RDW: 15.3 % (ref 11.5–15.5)
WBC: 5.8 10*3/uL (ref 4.0–10.5)

## 2013-10-05 LAB — COMPREHENSIVE METABOLIC PANEL
ALBUMIN: 3.4 g/dL — AB (ref 3.5–5.2)
ALK PHOS: 99 U/L (ref 39–117)
ALT: 11 U/L (ref 0–35)
AST: 20 U/L (ref 0–37)
BUN: 10 mg/dL (ref 6–23)
CALCIUM: 9.3 mg/dL (ref 8.4–10.5)
CO2: 27 mEq/L (ref 19–32)
Chloride: 97 mEq/L (ref 96–112)
Creatinine, Ser: 1.24 mg/dL — ABNORMAL HIGH (ref 0.50–1.10)
GFR calc non Af Amer: 49 mL/min — ABNORMAL LOW (ref >90)
GFR, EST AFRICAN AMERICAN: 57 mL/min — AB (ref >90)
Glucose, Bld: 175 mg/dL — ABNORMAL HIGH (ref 70–99)
POTASSIUM: 3.9 meq/L (ref 3.7–5.3)
Sodium: 139 mEq/L (ref 137–147)
Total Bilirubin: 0.5 mg/dL (ref 0.3–1.2)
Total Protein: 6.8 g/dL (ref 6.0–8.3)

## 2013-10-05 LAB — ETHANOL

## 2013-10-05 LAB — RAPID URINE DRUG SCREEN, HOSP PERFORMED
Amphetamines: NOT DETECTED
BENZODIAZEPINES: NOT DETECTED
Barbiturates: NOT DETECTED
Cocaine: NOT DETECTED
OPIATES: NOT DETECTED
TETRAHYDROCANNABINOL: POSITIVE — AB

## 2013-10-05 LAB — CBG MONITORING, ED
GLUCOSE-CAPILLARY: 88 mg/dL (ref 70–99)
GLUCOSE-CAPILLARY: 136 mg/dL — AB (ref 70–99)
Glucose-Capillary: 172 mg/dL — ABNORMAL HIGH (ref 70–99)

## 2013-10-05 MED ORDER — METOPROLOL TARTRATE 25 MG PO TABS
25.0000 mg | ORAL_TABLET | Freq: Two times a day (BID) | ORAL | Status: DC
  Administered 2013-10-05 – 2013-10-06 (×2): 25 mg via ORAL
  Filled 2013-10-05 (×2): qty 1

## 2013-10-05 MED ORDER — ONDANSETRON HCL 4 MG PO TABS: 4.0000 mg | ORAL_TABLET | Freq: Three times a day (TID) | ORAL | Status: DC | PRN

## 2013-10-05 MED ORDER — METFORMIN HCL 500 MG PO TABS
500.0000 mg | ORAL_TABLET | Freq: Two times a day (BID) | ORAL | Status: DC
  Administered 2013-10-06 (×2): 500 mg via ORAL
  Filled 2013-10-05 (×3): qty 1

## 2013-10-05 MED ORDER — TRAZODONE HCL 100 MG PO TABS
100.0000 mg | ORAL_TABLET | Freq: Every day | ORAL | Status: DC
  Administered 2013-10-05: 100 mg via ORAL
  Filled 2013-10-05: qty 1

## 2013-10-05 MED ORDER — TRAMADOL HCL 50 MG PO TABS
50.0000 mg | ORAL_TABLET | Freq: Three times a day (TID) | ORAL | Status: DC | PRN
  Administered 2013-10-05 – 2013-10-06 (×2): 50 mg via ORAL
  Filled 2013-10-05 (×2): qty 1

## 2013-10-05 MED ORDER — ALUM & MAG HYDROXIDE-SIMETH 200-200-20 MG/5ML PO SUSP: 30.0000 mL | ORAL | Status: DC | PRN

## 2013-10-05 MED ORDER — FLUOXETINE HCL 20 MG PO TABS
20.0000 mg | ORAL_TABLET | Freq: Every morning | ORAL | Status: DC
  Administered 2013-10-06: 20 mg via ORAL
  Filled 2013-10-05: qty 1

## 2013-10-05 MED ORDER — ZOLPIDEM TARTRATE 5 MG PO TABS: 5.0000 mg | ORAL_TABLET | Freq: Every evening | ORAL | Status: DC | PRN

## 2013-10-05 MED ORDER — LORAZEPAM 1 MG PO TABS
1.0000 mg | ORAL_TABLET | Freq: Three times a day (TID) | ORAL | Status: DC | PRN
  Administered 2013-10-05 – 2013-10-06 (×2): 1 mg via ORAL
  Filled 2013-10-05 (×2): qty 1

## 2013-10-05 MED ORDER — LORAZEPAM 1 MG PO TABS
2.0000 mg | ORAL_TABLET | Freq: Once | ORAL | Status: AC
  Administered 2013-10-05: 2 mg via ORAL
  Filled 2013-10-05: qty 2

## 2013-10-05 MED ORDER — SIMVASTATIN 5 MG PO TABS
5.0000 mg | ORAL_TABLET | Freq: Every day | ORAL | Status: DC
  Administered 2013-10-06: 5 mg via ORAL
  Filled 2013-10-05: qty 1

## 2013-10-05 MED ORDER — NICOTINE 21 MG/24HR TD PT24: 21.0000 mg | MEDICATED_PATCH | Freq: Every day | TRANSDERMAL | Status: DC

## 2013-10-05 MED ORDER — AMLODIPINE BESYLATE 5 MG PO TABS
5.0000 mg | ORAL_TABLET | Freq: Every morning | ORAL | Status: DC
  Administered 2013-10-06: 5 mg via ORAL
  Filled 2013-10-05: qty 1

## 2013-10-05 MED ORDER — ACETAMINOPHEN 325 MG PO TABS: 650.0000 mg | ORAL_TABLET | ORAL | Status: DC | PRN

## 2013-10-05 MED ORDER — DIVALPROEX SODIUM ER 500 MG PO TB24
500.0000 mg | ORAL_TABLET | Freq: Every morning | ORAL | Status: DC
  Administered 2013-10-06: 500 mg via ORAL
  Filled 2013-10-05: qty 1

## 2013-10-05 MED ORDER — DIVALPROEX SODIUM ER 500 MG PO TB24
1000.0000 mg | ORAL_TABLET | Freq: Every day | ORAL | Status: DC
  Administered 2013-10-05: 1000 mg via ORAL
  Filled 2013-10-05 (×2): qty 2

## 2013-10-05 MED ORDER — IBUPROFEN 200 MG PO TABS
600.0000 mg | ORAL_TABLET | Freq: Three times a day (TID) | ORAL | Status: DC | PRN
  Administered 2013-10-05: 600 mg via ORAL
  Filled 2013-10-05: qty 3

## 2013-10-05 MED ORDER — HYDROXYZINE HCL 10 MG PO TABS
10.0000 mg | ORAL_TABLET | Freq: Three times a day (TID) | ORAL | Status: DC | PRN
  Administered 2013-10-06: 10 mg via ORAL
  Filled 2013-10-05: qty 1

## 2013-10-05 NOTE — ED Notes (Signed)
Pt alert, arrives from home, c/o "i want to kill myself", " i dont want to kill my grandkids", resp even unlabored, skin pwd, family bedside, pt is tearful, onset was a few days ago

## 2013-10-05 NOTE — ED Notes (Signed)
Informed NP that home meds had not been restarted.

## 2013-10-05 NOTE — ED Notes (Signed)
Patient emotional, tearful. Tangential. Reports "I've lost my mind". Patient feels that she is forgetful and losing things. States she has problems in her current living situation with daughter and grandchildren. Reports not feeling safe going back. Has plan to move. States that she has no support system.   Encouragement offered. Given Motrin and Ativan.  Q 15 safety checks in place.

## 2013-10-05 NOTE — ED Provider Notes (Signed)
CSN: [id number]     Arrival date & time 10/05/13  1330 History   First MD Initiated Contact with Patient 10/05/13 1410     Chief Complaint  Patient presents with  . Suicidal     (Consider location/radiation/quality/duration/timing/severity/associated sxs/prior Treatment) HPI....... depression, suicidal ideation for several days. Patient has long history of same.  She has been under excessive stress secondary to living with her daughter and try to take care of her grandchildren.  She has been drinking alcohol again. Has history of bipolar illness and substance abuse.  She is very anxious.  Severity is moderate to severe  Past Medical History  Diagnosis Date  . Bipolar 1 disorder   . Obesity   . Diabetes mellitus   . Hypertension   . Vomiting   . Substance abuse     alcohol   . Type II or unspecified type diabetes mellitus without mention of complication, not stated as uncontrolled   . Depression   . Hyperlipidemia   . Anxiety    Past Surgical History  Procedure Laterality Date  . Tonsillectomy    . Laparoscopic gastric banding  06/11/09  . Vaginal delivery  1987   Family History  Problem Relation Age of Onset  . Osteoarthritis Mother   . Heart failure Father   . Osteoarthritis Father   . Alcohol abuse Father    History  Substance Use Topics  . Smoking status: Never Smoker   . Smokeless tobacco: Never Used  . Alcohol Use: No     Comment: social/ denies present use   OB History   Grav Para Term Preterm Abortions TAB SAB Ect Mult Living                 Review of Systems  All other systems reviewed and are negative.     Allergies  Review of patient's allergies indicates no known allergies.  Home Medications   Prior to Admission medications   Medication Sig Start Date End Date Taking? Authorizing Provider  amLODipine (NORVASC) 5 MG tablet Take 1 tablet (5 mg total) by mouth every morning. 08/29/13  Yes Quentin MullingAmanda Collier, PA-C  FLUoxetine (PROZAC) 20 MG tablet Take  20 mg by mouth daily.   Yes Historical Provider, MD  hydrOXYzine (ATARAX/VISTARIL) 10 MG tablet Take 1 tablet (10 mg total) by mouth 3 (three) times daily as needed for anxiety. 09/28/13  Yes Quentin MullingAmanda Collier, PA-C  metFORMIN (GLUCOPHAGE) 500 MG tablet Take 1 tablet (500 mg total) by mouth 2 (two) times daily. For diabetes control 08/29/13  Yes Quentin MullingAmanda Collier, PA-C  metoprolol tartrate (LOPRESSOR) 25 MG tablet Take 1 tablet (25 mg total) by mouth 2 (two) times daily. 08/29/13  Yes Quentin MullingAmanda Collier, PA-C  traMADol (ULTRAM) 50 MG tablet Take 1 tablet (50 mg total) by mouth 3 (three) times daily as needed for moderate pain. 09/28/13 09/28/14 Yes Quentin MullingAmanda Collier, PA-C  divalproex (DEPAKOTE ER) 500 MG 24 hr tablet Take 1 tablet (500 mg ) Q daily and 2 tablets (1000 mg) Q bedtime: For mood stabilization 12/09/12   Sanjuana KavaAgnes I Nwoko, NP  pravastatin (PRAVACHOL) 20 MG tablet Take 1 tablet (20 mg total) by mouth daily. 08/29/13   Quentin MullingAmanda Collier, PA-C  traZODone (DESYREL) 100 MG tablet Take 1 tablet (100 mg total) by mouth at bedtime. For sleep 12/09/12   Sanjuana KavaAgnes I Nwoko, NP   BP 134/87  Pulse 88  Temp(Src) 98.3 F (36.8 C) (Oral)  Resp 20  SpO2 97%  LMP 11/21/2012 Physical Exam  Nursing note and vitals reviewed. Constitutional: She is oriented to person, place, and time. She appears well-developed and well-nourished.  HENT:  Head: Normocephalic and atraumatic.  Eyes: Conjunctivae and EOM are normal. Pupils are equal, round, and reactive to light.  Neck: Normal range of motion. Neck supple.  Cardiovascular: Normal rate, regular rhythm and normal heart sounds.   Pulmonary/Chest: Effort normal and breath sounds normal.  Abdominal: Soft. Bowel sounds are normal.  Musculoskeletal: Normal range of motion.  Neurological: She is alert and oriented to person, place, and time.  Skin: Skin is warm and dry.  Psychiatric:  Anxious, restless, depressed    ED Course  Procedures (including critical care time) Labs  Review Labs Reviewed  COMPREHENSIVE METABOLIC PANEL - Abnormal; Notable for the following:    Glucose, Bld 175 (*)    Creatinine, Ser 1.24 (*)    Albumin 3.4 (*)    GFR calc non Af Amer 49 (*)    GFR calc Af Amer 57 (*)    All other components within normal limits  CBG MONITORING, ED - Abnormal; Notable for the following:    Glucose-Capillary 172 (*)    All other components within normal limits  CBC  ETHANOL  URINE RAPID DRUG SCREEN (HOSP PERFORMED)    Imaging Review No results found.   EKG Interpretation None      MDM   Final diagnoses:  Depression    Patient has bipolar disorder and depression along with substance abuse.  Consultation to behavioral health     Donnetta HutchingBrian Cook, MD 10/05/13 1538
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
## 2013-10-05 NOTE — ED Notes (Signed)
Bed: [facility] Expected date:  Expected time:  Means of arrival:  Comments: 

## 2013-10-06 ENCOUNTER — Encounter (HOSPITAL_COMMUNITY): Payer: Self-pay | Admitting: *Deleted

## 2013-10-06 ENCOUNTER — Encounter (HOSPITAL_COMMUNITY): Payer: Self-pay | Admitting: Psychiatry

## 2013-10-06 ENCOUNTER — Inpatient Hospital Stay (HOSPITAL_COMMUNITY)
Admission: AD | Admit: 2013-10-06 | Discharge: 2013-10-12 | DRG: 885 | Disposition: A | Discharge status: Home / Self Care | Payer: 59 | Source: Intra-hospital | Attending: Psychiatry | Admitting: Psychiatry

## 2013-10-06 DIAGNOSIS — F3130 Bipolar disorder, current episode depressed, mild or moderate severity, unspecified: Secondary | ICD-10-CM | POA: Diagnosis present

## 2013-10-06 DIAGNOSIS — F315 Bipolar disorder, current episode depressed, severe, with psychotic features: Secondary | ICD-10-CM

## 2013-10-06 DIAGNOSIS — E785 Hyperlipidemia, unspecified: Secondary | ICD-10-CM | POA: Diagnosis present

## 2013-10-06 DIAGNOSIS — F4310 Post-traumatic stress disorder, unspecified: Secondary | ICD-10-CM | POA: Diagnosis present

## 2013-10-06 DIAGNOSIS — E119 Type 2 diabetes mellitus without complications: Secondary | ICD-10-CM | POA: Diagnosis present

## 2013-10-06 DIAGNOSIS — R45851 Suicidal ideations: Secondary | ICD-10-CM | POA: Diagnosis not present

## 2013-10-06 DIAGNOSIS — E7800 Pure hypercholesterolemia, unspecified: Secondary | ICD-10-CM | POA: Diagnosis present

## 2013-10-06 DIAGNOSIS — Z8249 Family history of ischemic heart disease and other diseases of the circulatory system: Secondary | ICD-10-CM | POA: Diagnosis not present

## 2013-10-06 DIAGNOSIS — I10 Essential (primary) hypertension: Secondary | ICD-10-CM | POA: Diagnosis present

## 2013-10-06 DIAGNOSIS — F419 Anxiety disorder, unspecified: Secondary | ICD-10-CM

## 2013-10-06 DIAGNOSIS — F1994 Other psychoactive substance use, unspecified with psychoactive substance-induced mood disorder: Secondary | ICD-10-CM | POA: Diagnosis present

## 2013-10-06 DIAGNOSIS — F1210 Cannabis abuse, uncomplicated: Secondary | ICD-10-CM | POA: Diagnosis present

## 2013-10-06 DIAGNOSIS — F411 Generalized anxiety disorder: Secondary | ICD-10-CM | POA: Diagnosis present

## 2013-10-06 DIAGNOSIS — G4700 Insomnia, unspecified: Secondary | ICD-10-CM | POA: Diagnosis present

## 2013-10-06 LAB — CBG MONITORING, ED
GLUCOSE-CAPILLARY: 99 mg/dL (ref 70–99)
Glucose-Capillary: 94 mg/dL (ref 70–99)
Glucose-Capillary: 85 mg/dL (ref 70–99)

## 2013-10-06 MED ORDER — DIVALPROEX SODIUM ER 500 MG PO TB24
1000.0000 mg | ORAL_TABLET | Freq: Every day | ORAL | Status: DC
  Administered 2013-10-06 – 2013-10-11 (×6): 1000 mg via ORAL
  Filled 2013-10-06 (×8): qty 2

## 2013-10-06 MED ORDER — METOPROLOL TARTRATE 25 MG PO TABS
25.0000 mg | ORAL_TABLET | Freq: Two times a day (BID) | ORAL | Status: DC
  Administered 2013-10-06 – 2013-10-12 (×12): 25 mg via ORAL
  Filled 2013-10-06 (×17): qty 1

## 2013-10-06 MED ORDER — ACETAMINOPHEN 325 MG PO TABS: 650.0000 mg | ORAL_TABLET | ORAL | Status: DC | PRN

## 2013-10-06 MED ORDER — ONDANSETRON HCL 4 MG PO TABS
4.0000 mg | ORAL_TABLET | Freq: Three times a day (TID) | ORAL | Status: DC | PRN
Start: 2013-10-06 — End: 2013-10-12

## 2013-10-06 MED ORDER — HYDROXYZINE HCL 25 MG PO TABS: 25.0000 mg | ORAL_TABLET | Freq: Three times a day (TID) | ORAL | Status: DC | PRN

## 2013-10-06 MED ORDER — TRAZODONE HCL 100 MG PO TABS
ORAL_TABLET | ORAL | Status: AC
  Administered 2013-10-06: 23:00:00
  Filled 2013-10-06: qty 1

## 2013-10-06 MED ORDER — METFORMIN HCL 500 MG PO TABS
500.0000 mg | ORAL_TABLET | Freq: Two times a day (BID) | ORAL | Status: DC
  Administered 2013-10-07 – 2013-10-12 (×11): 500 mg via ORAL
  Filled 2013-10-06 (×16): qty 1

## 2013-10-06 MED ORDER — MAGNESIUM HYDROXIDE 400 MG/5ML PO SUSP: 30.0000 mL | Freq: Every day | ORAL | Status: DC | PRN

## 2013-10-06 MED ORDER — IBUPROFEN 600 MG PO TABS: 600.0000 mg | ORAL_TABLET | Freq: Three times a day (TID) | ORAL | Status: DC | PRN

## 2013-10-06 MED ORDER — AMLODIPINE BESYLATE 5 MG PO TABS
5.0000 mg | ORAL_TABLET | Freq: Every morning | ORAL | Status: DC
  Administered 2013-10-07 – 2013-10-12 (×6): 5 mg via ORAL
  Filled 2013-10-06 (×9): qty 1

## 2013-10-06 MED ORDER — METOPROLOL TARTRATE 25 MG PO TABS
ORAL_TABLET | ORAL | Status: AC
  Filled 2013-10-06: qty 1

## 2013-10-06 MED ORDER — DIVALPROEX SODIUM 500 MG PO DR TAB
DELAYED_RELEASE_TABLET | ORAL | Status: AC
  Filled 2013-10-06: qty 2

## 2013-10-06 MED ORDER — TRAZODONE HCL 100 MG PO TABS
100.0000 mg | ORAL_TABLET | Freq: Every day | ORAL | Status: DC
  Administered 2013-10-06 – 2013-10-11 (×6): 100 mg via ORAL
  Filled 2013-10-06 (×5): qty 1
  Filled 2013-10-06: qty 14
  Filled 2013-10-06 (×2): qty 1

## 2013-10-06 MED ORDER — LORAZEPAM 1 MG PO TABS
1.0000 mg | ORAL_TABLET | Freq: Three times a day (TID) | ORAL | Status: DC | PRN
  Administered 2013-10-08: 1 mg via ORAL
  Filled 2013-10-06: qty 1

## 2013-10-06 MED ORDER — TRAMADOL HCL 50 MG PO TABS
ORAL_TABLET | ORAL | Status: AC
  Administered 2013-10-06: 23:00:00
  Filled 2013-10-06: qty 1

## 2013-10-06 MED ORDER — ALUM & MAG HYDROXIDE-SIMETH 200-200-20 MG/5ML PO SUSP: 30.0000 mL | ORAL | Status: DC | PRN

## 2013-10-06 MED ORDER — HYDROXYZINE HCL 25 MG PO TABS
25.0000 mg | ORAL_TABLET | Freq: Three times a day (TID) | ORAL | Status: DC | PRN
  Administered 2013-10-08 – 2013-10-12 (×7): 25 mg via ORAL
  Filled 2013-10-06 (×2): qty 1
  Filled 2013-10-06: qty 42
  Filled 2013-10-06 (×5): qty 1

## 2013-10-06 MED ORDER — SIMVASTATIN 5 MG PO TABS
5.0000 mg | ORAL_TABLET | Freq: Every day | ORAL | Status: DC
  Administered 2013-10-07 – 2013-10-11 (×5): 5 mg via ORAL
  Filled 2013-10-06 (×8): qty 1

## 2013-10-06 MED ORDER — FLUOXETINE HCL 20 MG PO TABS
20.0000 mg | ORAL_TABLET | Freq: Every morning | ORAL | Status: DC
  Administered 2013-10-07 – 2013-10-12 (×6): 20 mg via ORAL
  Filled 2013-10-06 (×4): qty 1
  Filled 2013-10-06: qty 14
  Filled 2013-10-06 (×4): qty 1

## 2013-10-06 MED ORDER — TRAMADOL HCL 50 MG PO TABS
50.0000 mg | ORAL_TABLET | Freq: Three times a day (TID) | ORAL | Status: DC | PRN
  Administered 2013-10-06 – 2013-10-07 (×2): 50 mg via ORAL
  Filled 2013-10-06: qty 1

## 2013-10-06 MED ORDER — NICOTINE 21 MG/24HR TD PT24
21.0000 mg | MEDICATED_PATCH | Freq: Every day | TRANSDERMAL | Status: DC
  Filled 2013-10-06 (×2): qty 1

## 2013-10-06 MED ORDER — DIVALPROEX SODIUM ER 500 MG PO TB24
500.0000 mg | ORAL_TABLET | Freq: Every morning | ORAL | Status: DC
  Administered 2013-10-07 – 2013-10-12 (×6): 500 mg via ORAL
  Filled 2013-10-06 (×9): qty 1
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
## 2013-10-06 NOTE — Progress Notes (Signed)
Pt is in bed and took pm meds. Pt was given ultram for right toe pian a 3/10. Report given to Jan.

## 2013-10-06 NOTE — ED Notes (Signed)
Patient up to restroom multiple times throughout the night. Reports having bad dreams and odd dreams throughout the night.  Encouragement offered. Environment adjusted.  Q 15 checks continue for safety.

## 2013-10-07 ENCOUNTER — Encounter (HOSPITAL_COMMUNITY): Payer: Self-pay | Admitting: Psychiatry

## 2013-10-07 DIAGNOSIS — R45851 Suicidal ideations: Secondary | ICD-10-CM

## 2013-10-07 DIAGNOSIS — F3130 Bipolar disorder, current episode depressed, mild or moderate severity, unspecified: Principal | ICD-10-CM

## 2013-10-07 LAB — GLUCOSE, CAPILLARY
Glucose-Capillary: 62 mg/dL — ABNORMAL LOW (ref 70–99)
Glucose-Capillary: 66 mg/dL — ABNORMAL LOW (ref 70–99)
Glucose-Capillary: 86 mg/dL (ref 70–99)
Glucose-Capillary: 77 mg/dL (ref 70–99)

## 2013-10-07 MED ORDER — HALOPERIDOL LACTATE 5 MG/ML IJ SOLN
5.0000 mg | Freq: Once | INTRAMUSCULAR | Status: AC
Start: 2013-10-07 — End: 2013-10-07
  Administered 2013-10-07: 5 mg via INTRAMUSCULAR

## 2013-10-07 MED ORDER — LORAZEPAM 2 MG/ML IJ SOLN
INTRAMUSCULAR | Status: AC
  Filled 2013-10-07: qty 1

## 2013-10-07 MED ORDER — LORAZEPAM 2 MG/ML IJ SOLN
2.0000 mg | Freq: Once | INTRAMUSCULAR | Status: AC
  Administered 2013-10-07: 2 mg via INTRAMUSCULAR

## 2013-10-07 MED ORDER — HALOPERIDOL LACTATE 5 MG/ML IJ SOLN
INTRAMUSCULAR | Status: AC
  Administered 2013-10-07: 5 mg via INTRAMUSCULAR
  Filled 2013-10-07: qty 1
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
## 2013-10-07 NOTE — [facility] Suicide Risk Assessment (Signed)
Suicide Risk Assessment  Admission Assessment     Nursing information obtained from:  Patient Demographic factors:  Caucasian;Low socioeconomic status Current Mental Status:  Self-harm thoughts Loss Factors:  Loss of significant relationship;Decline in physical health Historical Factors:  Family history of mental illness or substance abuse Risk Reduction Factors:  Religious beliefs about death Total Time spent with patient: 45 minutes  CLINICAL FACTORS:   Bipolar Disorder:   Mixed State Depressive phase  PsCOGNITIVE FEATURES THAT CONTRIBUTE TO RISK:  Closed-mindedness Loss of executive function Polarized thinking Thought constriction (tunnel vision)    SUICIDE RISK:   Moderate:  Frequent suicidal ideation with limited intensity, and duration, some specificity in terms of plans, no associated intent, good self-control, limited dysphoria/symptomatology, some risk factors present, and identifiable protective factors, including available and accessible social support.  PLAN OF CARE: Supportive approach/coping skills/relapse prevention                              Identify detox needs                              Optimize treatment with psychotropics  I certify that inpatient services furnished can reasonably be expected to improve the patient's condition.  LUGO,IRVING A 10/07/2013, 1:18 PM
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
## 2013-10-07 NOTE — Progress Notes (Signed)
Patient did not attend the evening speaker AA meeting. Pt remained in her bed during during the meeting.

## 2013-10-07 NOTE — Progress Notes (Signed)
Patient is observed resting in bed with covers over head. Respirations visualized and are WNL, even and unlabored. Per MHT at bed side, pt did wake up for snack and to toilet within the last hour. 1:1 obs remains for patient's safety. She is safe at this time. Lawrence MarseillesFriedman, Marian Eakes

## 2013-10-07 NOTE — [facility] Group Notes (Signed)
0900 nursing orientation   The focus of this group is to educate the patient on the purpose and policies of crisis stabilization and provide a format to answer questions about their admission.  The group details unit policies and expectations of patients while admitted.   Pt did not attend. She was in bed not wanting to come.

## 2013-10-07 NOTE — Progress Notes (Signed)
Hypoglycemic Event  CBG: 62  Treatment: 15 GM carbohydrate snack  Symptoms: None  Follow-up CBG: [id number]:0658 / 0719 CBG Result:66 / 86    Possible Reasons for Event: Unknown  Comments/MD notified:Dr Jonnalagadda called.  Awaiting callback.     Marion DownerHerbin, Lynn Palmer  Remember to initiate Hypoglycemia Order Set & complete

## 2013-10-07 NOTE — Progress Notes (Signed)
Pt observed to be hysterical on unit, shouting, crying, disrupting the milieu, and inconsolable even with therapeutic communication. Ordered sedative combination of Haldol 5mg  with Ativan 2mg  IM x 1 dose. Pt is healthy from a cardiac perspective via WNL EKG and relevant labwork. Will have nursing staff administer and continue to monitor.  Beau FannyJohn C Withrow, FNP-BC

## 2013-10-07 NOTE — Progress Notes (Signed)
Pt is on 1:1 for safety see progress notes in shadow chart.

## 2013-10-07 NOTE — [facility] Group Notes (Signed)
[facility] Group Notes: (Clinical Social Work)   10/07/2013      Type of Therapy:  Group Therapy   Participation Level:  Did Not Attend    Ambrose MantleMareida Grossman-Orr, LCSW 10/07/2013, 12:49 PM

## 2013-10-08 DIAGNOSIS — F3110 Bipolar disorder, current episode manic without psychotic features, unspecified: Secondary | ICD-10-CM

## 2013-10-08 LAB — GLUCOSE, CAPILLARY: GLUCOSE-CAPILLARY: 87 mg/dL (ref 70–99)

## 2013-10-08 NOTE — Plan of Care (Signed)
Problem: Diagnosis: Increased Risk For Suicide Attempt Goal: STG-Patient Will Attend All Groups On The Unit Outcome: Not Progressing Pt has remained in bed all day and has not attended groups. "I need to catch up on my rest." Goal: STG-Patient Will Report Suicidal Feelings to Staff Outcome: Progressing Pt endorsed passive SI this morning and was able to contract for safety. Tonight, she is denying SI and is calmer in her demeanor.

## 2013-10-08 NOTE — Progress Notes (Signed)
Adult Psychoeducational Group Note  Date:  10/08/2013 Time:  3:15PM  Group Topic/Focus:  Personal Choices and Values:   The focus of this group is to help patients assess and explore the importance of values in their lives, how their values affect their decisions, how they express their values and what opposes their expression.  Participation Level:  Did Not Attend  Additional Comments:  Pt did not attend group. Pt was in the bed asleep during group time   LEA, JANAY K 10/08/2013, 4:08 PM

## 2013-10-08 NOTE — Clinical Social Work Note (Signed)
When CSW attempted to do PSA with patient, she was sleeping/snoring.  Ambrose MantleMareida Grossman-Orr, LCSW 10/08/2013, 3:37 PM

## 2013-10-08 NOTE — Progress Notes (Signed)
Pt on 1:1 for safety see progress notes in shadow chart.

## 2013-10-08 NOTE — [facility] Group Notes (Signed)
[facility] Group Notes:  (Nursing/MHT/Case Management/Adjunct)  Date:  10/08/2013  Time:  2:26 PM  Type of Therapy:  Psychoeducational Skills  Participation Level:  Active  Participation Quality:  Appropriate  Affect:  Appropriate  Cognitive:  Appropriate  Insight:  Appropriate  Engagement in Group:  Engaged  Modes of Intervention:  Discussion  Summary of Progress/Problems: Pt did attend healthy supports systems group, pt reported that his mom and dad were his support system. Pt also reported that [facility] when he is discharged.      Jacquelyne BalintForrest, Shalita [name] 10/08/2013, 2:26 PM

## 2013-10-08 NOTE — Progress Notes (Signed)
Pt sitting up in bed, crying and frustrated. States, "I stink. I just want to get some gowns and take a shower." Pt helpless, complaining about the bed, the pillow and upset she cannot go down to the cafeteria for breakfast. "I'm not suicidal anymore. Why can't I leave this room? I was told I had to stay in here." Explained the unit policy for 1:1 observation patients and assured her a tray would be brought back for her. Suggested she go down to dayroom with peers and explained she does not have to stay in her room. This Clinical research associatewriter assisted her to the toilet and assured her she would be able to complete hygiene once female MHT is available. Pt assisted into the wheelchair and taken down to the dayroom. No other complaints at this time. 1:1 obs remains in place for patient's impulsive behavior, labile mood and safety as pt is a high fall risk. Pt safe. Lawrence MarseillesFriedman, Marian Eakes

## 2013-10-08 NOTE — [facility] Group Notes (Signed)
[facility] Group Notes: (Clinical Social Work)   10/08/2013      Type of Therapy:  Group Therapy   Participation Level:  Did Not Attend    Lynn MantleMareida Grossman-Orr, LCSW 10/08/2013, 12:54 PM

## 2013-10-08 NOTE — Progress Notes (Signed)
Patient did not attend the evening speaker AA meeting. Pt remained in her room sleeping.

## 2013-10-08 NOTE — [facility] Group Notes (Signed)
[facility] Group Notes:  (Nursing/MHT/Case Management/Adjunct)  Date:  10/08/2013  Time:  1:08 PM  Type of Therapy:  Psychoeducational Skills  Participation Level:  Did Not Attend  Lynn DresserForrest, [name] 10/08/2013, 1:08 PM

## 2013-10-08 NOTE — Progress Notes (Signed)
Pt has been in bed all evening until snack time. Continues to maintain she needs sleep more than programming. Was able to reach her boyfriend "[name]" and is calmer now that she has done so. She is compliant with medications. Observed to be ambulating without wheelchair, is unsteady and dropped snack items on floor. Reminded pt of fall precautions and need to utilize wheelchair as she is still unsteady and shaky. Pt also given support and encouragement. Given vistaril prn for anxiety and shakiness. Pt verbalized understanding of falls teaching and promises this writer she will be compliant. Pt currently resting in bed, calmer. She denies SI/HI at this time and remains safe. Will continue to monitor closely. Lawrence MarseillesFriedman, Marian Eakes

## 2013-10-08 NOTE — Progress Notes (Signed)
Pt 1-1 discontinued at this time, pt currently in bed resting comfortably with eyes closed. See post 1-1 notes for further documentation notes

## 2013-10-08 NOTE — Progress Notes (Signed)
Pt observed resting on her R side with eyes closed. RR WNL, even and unlabored. Pt adjusting position as needed. No acute distress. No complaints voiced. Level I obs in place for safety and pt remains safe. Lawrence MarseillesFriedman, Marian Eakes

## 2013-10-08 NOTE — Progress Notes (Signed)
[facility]  10/08/2013 12:41 PM Lynn Palmer  MRN:  [id number] Subjective:  Still very upset with the whole situation with her BF and his family, and the house she was staying at. Easily agitated. She states she is thinking she will have to let go of the relationship but at the same time states she does not want to do it. Does not know how she is going to handle the brake up. She would at least like some communication with the manager of the half way house she was at so he does not give the bed away.  Diagnosis:   DSM5: Schizophrenia Disorders:  none Obsessive-Compulsive Disorders:  none Trauma-Stressor Disorders:  none Substance/Addictive Disorders:  Cannabis Use Disorder - Moderate 9304.30) Depressive Disorders:  Major Depressive Disorder - Moderate (296.22) Total Time spent with patient: 30 minutes  Axis I: Bipolar, Depressed and Bipolar, Manic  ADL's:  Intact  Sleep: Fair  Appetite:  Fair  Suicidal Ideation:  Plan:  denies Intent:  denies Means:  denies Homicidal Ideation:  Plan:  denies Intent:  denies Means:  denies AEB (as evidenced by):  Psychiatric Specialty Exam: Physical Exam  Review of Systems  Constitutional: Positive for malaise/fatigue.  HENT: Negative.   Eyes: Negative.   Respiratory: Negative.   Cardiovascular: Negative.   Gastrointestinal: Negative.   Genitourinary: Negative.   Musculoskeletal: Positive for back pain and joint pain.  Skin: Negative.   Neurological: Positive for weakness.  Endo/Heme/Allergies: Negative.   Psychiatric/Behavioral: Positive for depression and substance abuse. The patient is nervous/anxious and has insomnia.     Blood pressure 134/88, pulse 76, temperature 99.1 F (37.3 C), temperature source Oral, resp. rate 20, height 5\' 1"  (1.549 m), weight 94.802 kg (209 lb), last menstrual period 11/21/2012, SpO2 98.00%.Body mass index is 39.51 kg/(m^2).  General Appearance: Disheveled  Eye SolicitorContact::  Fair  Speech:   Clear and Coherent  Volume:  Normal  Mood:  Anxious and worried, irritated  Affect:  worried, anxious  Thought Process:  Coherent and Goal Directed  Orientation:  Full (Time, Place, and Person)  Thought Content:  events, worries, concerns  Suicidal Thoughts:  No  Homicidal Thoughts:  No  Memory:  Immediate;   Fair Recent;   Fair Remote;   Fair  Judgement:  Fair  Insight:  Present and Shallow  Psychomotor Activity:  Restlessness  Concentration:  Fair  Recall:  FiservFair  Fund of Knowledge:NA  Language: Fair  Akathisia:  No  Handed:    AIMS (if indicated):     Assets:  Desire for Improvement  Sleep:  Number of Hours: 6   Musculoskeletal: Strength & Muscle Tone: decreased Gait & Station: unsteady Patient leans: N/A  Current Medications: Current Facility-Administered Medications  Medication Dose Route Frequency Provider Last Rate Last Dose  . acetaminophen (TYLENOL) tablet 650 mg  650 mg Oral Q4H PRN [name], NP      . alum & mag hydroxide-simeth (MAALOX/MYLANTA) 200-200-20 MG/5ML suspension 30 mL  30 mL Oral PRN [name], NP      . amLODipine (NORVASC) tablet 5 mg  5 mg Oral q morning - 10a [name], NP   5 mg at 10/08/13 0819  . divalproex (DEPAKOTE ER) 24 hr tablet 1,000 mg  1,000 mg Oral QHS [name], NP   1,000 mg at 10/07/13 2237  . divalproex (DEPAKOTE ER) 24 hr tablet 500 mg  500 mg Oral q morning - 10a [name], NP  500 mg at 10/08/13 0817  . FLUoxetine (PROZAC) tablet 20 mg  20 mg Oral q morning - 10a [name], NP   20 mg at 10/08/13 [id number]  . hydrOXYzine (ATARAX/VISTARIL) tablet 25 mg  25 mg Oral TID PRN [name], NP   25 mg at 10/08/13 0817  . ibuprofen (ADVIL,MOTRIN) tablet 600 mg  600 mg Oral Q8H PRN [name], NP      . LORazepam (ATIVAN) tablet 1 mg  1 mg Oral Q8H PRN [name], NP   1 mg at 10/08/13 0654  . magnesium hydroxide (MILK OF MAGNESIA) suspension 30 mL  30 mL Oral Daily PRN [name], NP      . metFORMIN  (GLUCOPHAGE) tablet 500 mg  500 mg Oral BID WC [name], NP   500 mg at 10/08/13 0819  . metoprolol tartrate (LOPRESSOR) tablet 25 mg  25 mg Oral BID [name], NP   25 mg at 10/08/13 0818  . ondansetron (ZOFRAN) tablet 4 mg  4 mg Oral Q8H PRN [name], NP      . simvastatin (ZOCOR) tablet 5 mg  5 mg Oral q1800 [name], NP   5 mg at 10/07/13 1806  . traMADol (ULTRAM) tablet 50 mg  50 mg Oral TID PRN [name], NP   50 mg at 10/07/13 1813  . traZODone (DESYREL) tablet 100 mg  100 mg Oral QHS [name], NP   100 mg at 10/07/13 2237    Lab Results:  Results for orders placed during the hospital encounter of 10/06/13 (from the past 48 hour(s))  GLUCOSE, CAPILLARY     Status: Abnormal   Collection Time    10/07/13  6:29 AM      Result Value Ref Range   Glucose-Capillary 62 (*) 70 - 99 mg/dL  GLUCOSE, CAPILLARY     Status: Abnormal   Collection Time    10/07/13  6:58 AM      Result Value Ref Range   Glucose-Capillary 66 (*) 70 - 99 mg/dL  GLUCOSE, CAPILLARY     Status: None   Collection Time    10/07/13  7:19 AM      Result Value Ref Range   Glucose-Capillary 86  70 - 99 mg/dL  GLUCOSE, CAPILLARY     Status: None   Collection Time    10/07/13  5:01 PM      Result Value Ref Range   Glucose-Capillary 77  70 - 99 mg/dL  GLUCOSE, CAPILLARY     Status: None   Collection Time    10/08/13  6:21 AM      Result Value Ref Range   Glucose-Capillary 87  70 - 99 mg/dL   Comment 1 Notify RN      Physical Findings: AIMS: Facial and Oral Movements Muscles of Facial Expression: None, normal Lips and Perioral Area: None, normal Jaw: None, normal Tongue: None, normal,Extremity Movements Upper (arms, wrists, hands, fingers): None, normal Lower (legs, knees, ankles, toes): None, normal, Trunk Movements Neck, shoulders, hips: None, normal, Overall Severity Severity of abnormal movements (highest score from questions above): None, normal Incapacitation due to abnormal  movements: None, normal, Dental Status Current problems with teeth and/or dentures?: No Does patient usually wear dentures?: Yes  CIWA:  CIWA-Ar Total: 5 COWS:  COWS Total Score: 4  Treatment Plan Summary: Daily contact with patient to assess and evaluate symptoms and progress in treatment Medication management  Plan: Supportive approach/coping skills/relapse prevention           Stress management, get phone number for the half way house to decrease her stress                level           Reassess her medications  Medical Decision Making Problem Points:  Review of psycho-social stressors (1) Data Points:  Review of medication regiment & side effects (2) Review of new medications or change in dosage (2) We discussed the possibility that the increase in Prozac might have thrown her into rapid cycling. Will keep the Prozac at 20 mg. She does not want to D/C it as found it helpful I certify that inpatient services furnished can reasonably be expected to improve the patient's condition.   LUGO,IRVING A 10/08/2013, 12:41 PM

## 2013-10-08 NOTE — Clinical Social Work Note (Signed)
Late Note for 10/07/13  On several occasions tried to meet with patient to do PSA, but was told by [facility] that she was not appropriate due to mood lability at that time.  Ambrose MantleMareida Grossman-Orr, LCSW 10/08/2013, 3:37 PM

## 2013-10-09 DIAGNOSIS — F1994 Other psychoactive substance use, unspecified with psychoactive substance-induced mood disorder: Secondary | ICD-10-CM

## 2013-10-09 DIAGNOSIS — F411 Generalized anxiety disorder: Secondary | ICD-10-CM

## 2013-10-09 LAB — GLUCOSE, CAPILLARY
GLUCOSE-CAPILLARY: 88 mg/dL (ref 70–99)
GLUCOSE-CAPILLARY: 61 mg/dL — AB (ref 70–99)
Glucose-Capillary: 68 mg/dL — ABNORMAL LOW (ref 70–99)
Glucose-Capillary: 70 mg/dL (ref 70–99)
Glucose-Capillary: 158 mg/dL — ABNORMAL HIGH (ref 70–99)
Glucose-Capillary: 99 mg/dL (ref 70–99)

## 2013-10-09 NOTE — Progress Notes (Signed)
[facility] MD Progress Note  10/09/2013 2:44 PM Lynn Palmer  MRN:  [id number] Subjective:  Lynn Palmer is trying to get her life back on track. States that she realizes that being in a relationship with Alycia Rossetti is not helping her as he is going to do what ever his parents tell him to do. She wants to be sure she can go back to the half way house where she was before Diagnosis:   DSM5: Schizophrenia Disorders:  none Obsessive-Compulsive Disorders:  none Trauma-Stressor Disorders:  none Substance/Addictive Disorders:  Cannabis Use Disorder - Moderate 9304.30) Depressive Disorders:  Major Depressive Disorder - Moderate (296.22) Total Time spent with patient: 30 minutes  Axis I: Anxiety Disorder NOS, Bipolar, Depressed and Substance Induced Mood Disorder  ADL's:  Intact  Sleep: Fair  Appetite:  Fair  Suicidal Ideation:  Plan:  denies Intent:  denies Means:  denies Homicidal Ideation:  Plan:  denies Intent:  denies Means:  denies AEB (as evidenced by):  Psychiatric Specialty Exam: Physical Exam  Review of Systems  Constitutional: Positive for malaise/fatigue.  HENT: Negative.   Eyes: Negative.   Respiratory: Negative.   Cardiovascular: Negative.   Gastrointestinal: Negative.   Genitourinary: Negative.   Musculoskeletal: Positive for back pain and joint pain.  Skin: Negative.   Neurological: Positive for weakness.  Endo/Heme/Allergies: Negative.   Psychiatric/Behavioral: Positive for depression. The patient is nervous/anxious.     Blood pressure 145/103, pulse 79, temperature 97.9 F (36.6 C), temperature source Oral, resp. rate 16, height 5\' 1"  (1.549 m), weight 94.802 kg (209 lb), last menstrual period 11/21/2012, SpO2 98.00%.Body mass index is 39.51 kg/(m^2).  General Appearance: Disheveled  Eye Solicitor::  Fair  Speech:  Clear and Coherent  Volume:  fluctuates  Mood:  Anxious, Dysphoric and Irritable  Affect:  Labile  Thought Process:  Coherent and Goal Directed  Orientation:   Full (Time, Place, and Person)  Thought Content:  symptoms, worries, concerns, ruminations  Suicidal Thoughts:  Intermittent  Homicidal Thoughts:  No  Memory:  Immediate;   Fair Recent;   Fair Remote;   Fair  Judgement:  Fair  Insight:  Present and Shallow  Psychomotor Activity:  Restlessness  Concentration:  Fair  Recall:  [employer] of Knowledge:NA  Language: Fair  Akathisia:  No  Handed:    AIMS (if indicated):     Assets:  Desire for Improvement  Sleep:  Number of Hours: 6.25   Musculoskeletal: Strength & Muscle Tone: decreased Gait & Station: unsteady Patient leans: N/A  Current Medications: Current Facility-Administered Medications  Medication Dose Route Frequency Provider Last Rate Last Dose  . acetaminophen (TYLENOL) tablet 650 mg  650 mg Oral Q4H PRN Kristeen Mans, NP      . alum & mag hydroxide-simeth (MAALOX/MYLANTA) 200-200-20 MG/5ML suspension 30 mL  30 mL Oral PRN Kristeen Mans, NP      . amLODipine (NORVASC) tablet 5 mg  5 mg Oral q morning - 10a Kristeen Mans, NP   5 mg at 10/09/13 0953  . divalproex (DEPAKOTE ER) 24 hr tablet 1,000 mg  1,000 mg Oral QHS Kristeen Mans, NP   1,000 mg at 10/08/13 2151  . divalproex (DEPAKOTE ER) 24 hr tablet 500 mg  500 mg Oral q morning - 10a Kristeen Mans, NP   500 mg at 10/09/13 0953  . FLUoxetine (PROZAC) tablet 20 mg  20 mg Oral q morning - 10a Kristeen Mans, NP   20 mg at 10/09/13 0953  .  hydrOXYzine (ATARAX/VISTARIL) tablet 25 mg  25 mg Oral TID PRN [name], NP   25 mg at 10/09/13 0954  . ibuprofen (ADVIL,MOTRIN) tablet 600 mg  600 mg Oral Q8H PRN [name], NP      . LORazepam (ATIVAN) tablet 1 mg  1 mg Oral Q8H PRN [name], NP   1 mg at 10/08/13 0654  . magnesium hydroxide (MILK OF MAGNESIA) suspension 30 mL  30 mL Oral Daily PRN [name], NP      . metFORMIN (GLUCOPHAGE) tablet 500 mg  500 mg Oral BID WC [name], NP   500 mg at 10/09/13 0825  . metoprolol tartrate (LOPRESSOR) tablet 25 mg  25  mg Oral BID [name], NP   25 mg at 10/09/13 0825  . ondansetron (ZOFRAN) tablet 4 mg  4 mg Oral Q8H PRN [name], NP      . simvastatin (ZOCOR) tablet 5 mg  5 mg Oral q1800 [name], NP   5 mg at 10/08/13 1735  . traMADol (ULTRAM) tablet 50 mg  50 mg Oral TID PRN [name], NP   50 mg at 10/07/13 1813  . traZODone (DESYREL) tablet 100 mg  100 mg Oral QHS [name], NP   100 mg at 10/08/13 2151    Lab Results:  Results for orders placed during the hospital encounter of 10/06/13 (from the past 48 hour(s))  GLUCOSE, CAPILLARY     Status: None   Collection Time    10/07/13  5:01 PM      Result Value Ref Range   Glucose-Capillary 77  70 - 99 mg/dL  GLUCOSE, CAPILLARY     Status: None   Collection Time    10/08/13  6:21 AM      Result Value Ref Range   Glucose-Capillary 87  70 - 99 mg/dL   Comment 1 Notify RN    GLUCOSE, CAPILLARY     Status: None   Collection Time    10/09/13  5:58 AM      Result Value Ref Range   Glucose-Capillary 88  70 - 99 mg/dL  GLUCOSE, CAPILLARY     Status: Abnormal   Collection Time    10/09/13 11:51 AM      Result Value Ref Range   Glucose-Capillary 61 (*) 70 - 99 mg/dL  GLUCOSE, CAPILLARY     Status: Abnormal   Collection Time    10/09/13  1:05 PM      Result Value Ref Range   Glucose-Capillary 68 (*) 70 - 99 mg/dL  GLUCOSE, CAPILLARY     Status: None   Collection Time    10/09/13  1:28 PM      Result Value Ref Range   Glucose-Capillary 70  70 - 99 mg/dL    Physical Findings: AIMS: Facial and Oral Movements Muscles of Facial Expression: None, normal Lips and Perioral Area: None, normal Jaw: None, normal Tongue: None, normal,Extremity Movements Upper (arms, wrists, hands, fingers): None, normal Lower (legs, knees, ankles, toes): None, normal, Trunk Movements Neck, shoulders, hips: None, normal, Overall Severity Severity of abnormal movements (highest score from questions above): None, normal Incapacitation due to  abnormal movements: None, normal, Dental Status Current problems with teeth and/or dentures?: No Does patient usually wear dentures?: Yes  CIWA:  CIWA-Ar Total: 5 COWS:  COWS Total Score: 4  Treatment Plan Summary: Daily contact with patient to assess and evaluate symptoms and progress  in treatment Medication management  Plan: Supportive approach/coping skills/relapse prevention           Optimize response to the psychotropics           CBT;mindfulness, stress management           [facility]  Medical Decision Making Problem Points:  Review of psycho-social stressors (1) Data Points:  Review of medication regiment & side effects (2)  I certify that inpatient services furnished can reasonably be expected to improve the patient's condition.   Dreyton Roessner A 10/09/2013, 2:44 PM

## 2013-10-09 NOTE — Progress Notes (Signed)
Pt's CBG before lunch was 61,Gingerale and snack given, rechecked at 68 Gingerale given and rechecked at 70. Pt given G[place] per protocol. Pt ate lunch and is resting in her room. No signs and symptoms of hypoglycemia at this time. Safety maintained on the unit.
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
## 2013-10-09 NOTE — [facility] Group Notes (Signed)
[facility]   10/09/2013 8:45 AM  Participation Quality:  Alert, Appropriate and Oriented  Mood/Affect:  Anxious  Depression Rating:  8  Anxiety Rating:  8  Thoughts of Suicide:  Pt denies SI/HI  Will you contract for safety?   Yes  Current AVH:  Pt denies  Plan for Discharge/Comments:  Pt attended discharge planning group and actively participated in group.  CSW provided pt with today's workbook.  Pt reports feeling "fair" today.  Pt states that she is unsure where she will go from here.  Pt explained that she was staying at a recovery house but moved in with her daughter for the last 2 weeks, which resulted in her relapse.  Pt is hopeful to be able to return to the recovery house.  CSW will assist pt by calling to owner today.  [facility] and [facility].  No further needs voiced by pt at this time.    Transportation Means: Pt reports access to transportation - unsure at this time  Supports: No supports mentioned at this time  Reyes IvanChelsea Horton, LCSW 10/09/2013 9:55 AM

## 2013-10-09 NOTE — [facility] Suicide Risk Assessment (Signed)
[facility] Family/Significant Other Suicide Prevention Education  Suicide Prevention Education:   Patient Refusal for Family/Significant Other Suicide Prevention Education: The patient has refused to provide written consent for family/significant other to be provided Family/Significant Other Suicide Prevention Education during admission and/or prior to discharge.  Physician notified.  CSW provided suicide prevention information with patient.    The suicide prevention education provided includes the following:  Suicide risk factors  Suicide prevention and interventions  National Suicide Hotline telephone number  [facility] assessment telephone number  [facility] Emergency Assistance 911  [facility] and/or Residential Mobile Crisis Unit telephone number   Reyes IvanChelsea Horton, [place] 10/09/2013 9:44 AM

## 2013-10-09 NOTE — Progress Notes (Signed)
Adult Psychoeducational Group Note  Date:  10/09/2013 Time:  10:00 am  Group Topic/Focus:  Wellness Toolbox:   The focus of this group is to discuss various aspects of wellness, balancing those aspects and exploring ways to increase the ability to experience wellness.  Patients will create a wellness toolbox for use upon discharge.  Participation Level:  Active  Participation Quality:  Appropriate, Sharing and Supportive  Affect:  Appropriate  Cognitive:  Appropriate  Insight: Appropriate  Engagement in Group:  Engaged  Modes of Intervention:  Discussion, Education, Socialization and Support  Additional Comments:  Pt stated that she is working on finding stable housing and moving into a positive neighborhood. Pt stated that the barriers to her progress are unhealthy relationships with her boyfriend and spending excessively.   Johnson, [place] 10/09/2013, 3:11 PM
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
## 2013-10-09 NOTE — BHH Group Notes (Signed)
B[facility] LCSW Group Therapy  10/09/2013 1:15 PM   Type of Therapy:  Group Therapy  Participation Level:  Did Not Attend - pt sleeping in her room  Reyes IvanChelsea Horton, LCSW 10/09/2013 2:33 PM

## 2013-10-10 LAB — GLUCOSE, CAPILLARY
GLUCOSE-CAPILLARY: 103 mg/dL — AB (ref 70–99)
Glucose-Capillary: 78 mg/dL (ref 70–99)
Glucose-Capillary: 113 mg/dL — ABNORMAL HIGH (ref 70–99)
Glucose-Capillary: 101 mg/dL — ABNORMAL HIGH (ref 70–99)

## 2013-10-10 NOTE — Progress Notes (Signed)
Recreation Therapy Notes  Animal-Assisted Activity/Therapy (AAA/T) Program Checklist/Progress Notes Patient Eligibility Criteria Checklist & Daily Group note for Rec Tx Intervention  Date: 06.30.2015 Time: 2:45 Location: [address] Day[address]    AAA/T Program Assumption of Risk Form signed by Patient/ or Parent Legal Guardian yes  Patient is free of allergies or sever asthma yes  Patient reports no fear of animals yes  Patient reports no history of cruelty to animals yes   Patient understands his/her participation is voluntary yes  Patient washes hands before animal contact yes  Patient washes hands after animal contact yes  Behavioral Response: Appropriate   Education: Hand Washing, Appropriate Animal Interaction   Education Outcome: Acknowledges understanding   Clinical Observations/Feedback: Patient interacted appropriately with therapeutic dog team and peers during session.   Denise L Blanchfield, LRT/CTRS  Blanchfield, Denise L 10/10/2013 4:31 PM 
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
## 2013-10-10 NOTE — Progress Notes (Signed)
   Pt laying in bed resting with eyes closed. Respirations even and unlabored. No distress noted. 15 min checks continued for safety. 
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
## 2013-10-10 NOTE — Progress Notes (Signed)
The focus of this group is to educate the patient on the purpose and policies of crisis stabilization and provide a format to answer questions about their admission.  The group details unit policies and expectations of patients while admitted. Pt participated in discussion of wellness. Pt required redirection to stay on topic. Pt was tearful with a labile mood during group. Pt did not participate with morning stretches or exercises.

## 2013-10-10 NOTE — Tx Team (Signed)
Interdisciplinary Treatment Plan Update (Adult)  Date: 10/10/2013  Time Reviewed:  9:45 AM  Progress in Treatment: Attending groups: Yes Participating in groups:  Yes Taking medication as prescribed:  Yes Tolerating medication:  Yes Family/Significant othe contact made: No, pt refused Patient understands diagnosis:  Yes Discussing patient identified problems/goals with staff:  Yes Medical problems stabilized or resolved:  Yes Denies suicidal/homicidal ideation: Yes Issues/concerns per patient self-inventory:  Yes Other:  New problem(s) identified: N/A  Discharge Plan or Barriers: [facility] and [facility].  Pt is working on her living situation.    Reason for Continuation of Hospitalization: Anxiety Depression Medication Stabilization Substance Abuse  Comments: N/A  Estimated length of stay: 3-4 days  For review of initial/current patient goals, please see plan of care.  Attendees: Patient:     Family:     Physician:  Dr. Dub MikesLugo 10/10/2013 9:43 AM   Nursing:   Neill Loftarol Davis, RN 10/10/2013 9:43 AM   Clinical Social Worker:  Reyes Ivanhelsea Horton, LCSW 10/10/2013 9:43 AM   Other: Roswell Minersonna Shimp, RN 10/10/2013 9:43 AM   Other:  Trula SladeHeather Smart, LCSWA 10/10/2013 9:43 AM   Other:  Waynetta SandyJan Wright, RN 10/10/2013 9:43 AM   Other:     Other:    Other:    Other:    Other:    Other:    Other:     Scribe for Treatment Team:   Carmina MillerHorton, Chelsea Nicole, 10/10/2013 , 9:43 AM

## 2013-10-10 NOTE — Progress Notes (Signed)
Child/Adolescent Psychoeducational Group Note  Date:  10/10/2013 Time:  10:19 PM  Group Topic/Focus:  Wrap-Up Group:   The focus of this group is to help patients review their daily goal of treatment and discuss progress on daily workbooks.  Participation Level:  Did Not Attend  Participation Quality:  Did Not Attend  Affect:  Did Not Attend  Cognitive:  Did Not Attend  Insight:  Did Not Attend  Engagement in Group:  Did Not Attend  Modes of Intervention:  Did Not Attend  Additional Comments:  Pt did not attend group due to fatigue.  Sheran Lawlesseese, Jonathon O 10/10/2013, 10:19 PM

## 2013-10-10 NOTE — BHH Group Notes (Signed)
B[facility] LCSW Group Therapy  10/10/2013   1:15 PM   Type of Therapy:  Group Therapy  Participation Level:  Active  Participation Quality:  Attentive, Sharing and Supportive  Affect:  Depressed and Flat  Cognitive:  Alert and Oriented  Insight:  Developing/Improving and Engaged  Engagement in Therapy:  Developing/Improving and Engaged  Modes of Intervention:  Activity, Clarification, Confrontation, Discussion, Education, Exploration, Limit-setting, Orientation, Problem-solving, Rapport Building, Dance movement psychotherapisteality Testing, Socialization and Support  Summary of Progress/Problems: Patient was attentive and engaged with speaker from [employer].  Patient was attentive to speaker while they shared their story of dealing with mental health and overcoming it.  Patient expressed interest in their programs and services and received information on their agency.  Patient processed ways they can relate to the speaker.     Lynn Nghiem Horton, LCSW 10/10/2013  2:10 PM

## 2013-10-11 LAB — GLUCOSE, CAPILLARY
GLUCOSE-CAPILLARY: 138 mg/dL — AB (ref 70–99)
Glucose-Capillary: 83 mg/dL (ref 70–99)
Glucose-Capillary: 101 mg/dL — ABNORMAL HIGH (ref 70–99)
Glucose-Capillary: 105 mg/dL — ABNORMAL HIGH (ref 70–99)

## 2013-10-11 LAB — VALPROIC ACID LEVEL: VALPROIC ACID LVL: 115.6 ug/mL — AB (ref 50.0–100.0)

## 2013-10-11 NOTE — [facility] Group Notes (Signed)
[facility]   10/11/2013 12:09 PM  Participation Quality:  Appropriate   Mood/Affect:  Appropriate  Depression Rating:  2  Anxiety Rating:  4  Thoughts of Suicide:  No Will you contract for safety?   NA  Current AVH:  No  Plan for Discharge/Comments:  Pt reports that she can return to recovery house on Thursday. CSW verified this with Jonny RuizJohn from Recovery home. She plans to [facility]. Pt reports that she has mild withdrawal symptoms today including anxiety and tremors. Poor eating and poor sleeping.   Transportation Means: John  Supports: John/some friends from recovery house/no family support identified   Counselling psychologistmart, OncologistHeather LCSWA

## 2013-10-11 NOTE — BHH Group Notes (Signed)
[facility] LCSW Group Therapy  10/11/2013 3:47 PM  Type of Therapy:  Group Therapy  Participation Level:  Did Not Attend-pt in room resting   Smart, Hazelle Woollard LCSWA  10/11/2013, 3:47 PM

## 2013-10-11 NOTE — B[facility] Group Notes (Signed)
Adult Psychoeducational Group Note  Date:  10/11/2013 Time:  10:43 PM  Group Topic/Focus:  Personal Choices and Values:   The focus of this group is to help patients assess and explore the importance of values in their lives, how their values affect their decisions, how they express their values and what opposes their expression.  Participation Level:  Did Not Attend  Additional Comments:  Pt refused to attend group stating she was too tired.   Ledora BottcherHolcomb, Rangel Echeverri G 10/11/2013, 10:43 PM

## 2013-10-11 NOTE — Progress Notes (Signed)
[facility]  10/11/2013 4:19 PM Enzo Montgomeryeresa Rijo  MRN:  [id number] Subjective:  [name] states that she is wanting to go back to the house she was in but her BF wants to go out couple of days with her. She plans just to sign out without telling them what she is going to do. States she will deal with the consequences. Does not want anyone to tell her what to do. Admits ths knows is not a good relationship but she is not ready to let it go Diagnosis:   DSM5: Schizophrenia Disorders:  none Obsessive-Compulsive Disorders:  none Trauma-Stressor Disorders:  none Substance/Addictive Disorders:  Cannabis Use Disorder - Moderate 9304.30) Depressive Disorders:  Major Depressive Disorder - Moderate (296.22) Total Time spent with patient: 30 minutes  Axis I: Bipolar, Depressed  ADL's:  Intact  Sleep: Fair  Appetite:  Fair  Suicidal Ideation:  Plan:  denies Intent:  denies Means:  denies Homicidal Ideation:  Plan:  denies Intent:  denies Means:  denies AEB (as evidenced by):  Psychiatric Specialty Exam: Physical Exam  Review of Systems  Constitutional: Negative.   HENT: Negative.   Eyes: Negative.   Respiratory: Negative.   Cardiovascular: Negative.   Gastrointestinal: Negative.   Genitourinary: Negative.   Musculoskeletal: Positive for back pain and joint pain.  Skin: Negative.   Neurological: Negative.   Endo/Heme/Allergies: Negative.   Psychiatric/Behavioral: Positive for depression and substance abuse. The patient is nervous/anxious.     Blood pressure 138/66, pulse 66, temperature 98 F (36.7 C), temperature source Oral, resp. rate 16, height 5\' 1"  (1.549 m), weight 94.802 kg (209 lb), last menstrual period 11/21/2012, SpO2 98.00%.Body mass index is 39.51 kg/(m^2).  General Appearance: Fairly Groomed  Patent attorneyye Contact::  Fair  Speech:  Clear and Coherent  Volume:  fluctuates  Mood:  Anxious and Irritable  Affect:  Restricted  Thought Process:  Coherent and Goal Directed   Orientation:  Full (Time, Place, and Person)  Thought Content:  symtpoms plans worries concerns  Suicidal Thoughts:  No  Homicidal Thoughts:  No  Memory:  Immediate;   Fair Recent;   Fair Remote;   Fair  Judgement:  Fair  Insight:  Present  Psychomotor Activity:  Restlessness  Concentration:  Fair  Recall:  [employer]  Fund of Knowledge:NA  Language: Fair  Akathisia:  No  Handed:    AIMS (if indicated):     Assets:  Desire for Improvement  Sleep:  Number of Hours: 6   Musculoskeletal: Strength & Muscle Tone: decreased Gait & Station: unsteady Patient leans: N/A  Current Medications: Current Facility-Administered Medications  Medication Dose Route Frequency Provider Last Rate Last Dose  . acetaminophen (TYLENOL) tablet 650 mg  650 mg Oral Q4H PRN [name], NP      . alum & mag hydroxide-simeth (MAALOX/MYLANTA) 200-200-20 MG/5ML suspension 30 mL  30 mL Oral PRN [name], NP      . amLODipine (NORVASC) tablet 5 mg  5 mg Oral q morning - 10a [name], NP   5 mg at 10/11/13 0843  . divalproex (DEPAKOTE ER) 24 hr tablet 1,000 mg  1,000 mg Oral QHS [name], NP   1,000 mg at 10/10/13 2224  . divalproex (DEPAKOTE ER) 24 hr tablet 500 mg  500 mg Oral q morning - 10a [name], NP   500 mg at 10/11/13 0846  . FLUoxetine (PROZAC) tablet 20 mg  20 mg Oral q morning - 10a Drenda FreezeFran  Domenic Polite, NP   20 mg at 10/11/13 0845  . hydrOXYzine (ATARAX/VISTARIL) tablet 25 mg  25 mg Oral TID PRN Kristeen Mans, NP   25 mg at 10/10/13 1903  . ibuprofen (ADVIL,MOTRIN) tablet 600 mg  600 mg Oral Q8H PRN Kristeen Mans, NP      . LORazepam (ATIVAN) tablet 1 mg  1 mg Oral Q8H PRN Kristeen Mans, NP   1 mg at 10/08/13 0654  . magnesium hydroxide (MILK OF MAGNESIA) suspension 30 mL  30 mL Oral Daily PRN Kristeen Mans, NP      . metFORMIN (GLUCOPHAGE) tablet 500 mg  500 mg Oral BID WC Kristeen Mans, NP   500 mg at 10/11/13 0843  . metoprolol tartrate (LOPRESSOR) tablet 25 mg  25 mg Oral BID Kristeen Mans, NP   25 mg at 10/11/13 0846  . ondansetron (ZOFRAN) tablet 4 mg  4 mg Oral Q8H PRN Kristeen Mans, NP      . simvastatin (ZOCOR) tablet 5 mg  5 mg Oral q1800 Kristeen Mans, NP   5 mg at 10/10/13 1901  . traMADol (ULTRAM) tablet 50 mg  50 mg Oral TID PRN Kristeen Mans, NP   50 mg at 10/07/13 1813  . traZODone (DESYREL) tablet 100 mg  100 mg Oral QHS Kristeen Mans, NP   100 mg at 10/10/13 2224    Lab Results:  Results for orders placed during the hospital encounter of 10/06/13 (from the past 48 hour(s))  GLUCOSE, CAPILLARY     Status: Abnormal   Collection Time    10/09/13  5:02 PM      Result Value Ref Range   Glucose-Capillary 158 (*) 70 - 99 mg/dL  GLUCOSE, CAPILLARY     Status: None   Collection Time    10/09/13 10:04 PM      Result Value Ref Range   Glucose-Capillary 99  70 - 99 mg/dL   Comment 1 Notify RN    GLUCOSE, CAPILLARY     Status: None   Collection Time    10/10/13  6:40 AM      Result Value Ref Range   Glucose-Capillary 78  70 - 99 mg/dL   Comment 1 Notify RN    GLUCOSE, CAPILLARY     Status: Abnormal   Collection Time    10/10/13 12:06 PM      Result Value Ref Range   Glucose-Capillary 113 (*) 70 - 99 mg/dL  GLUCOSE, CAPILLARY     Status: Abnormal   Collection Time    10/10/13  5:01 PM      Result Value Ref Range   Glucose-Capillary 101 (*) 70 - 99 mg/dL  VALPROIC ACID LEVEL     Status: Abnormal   Collection Time    10/10/13  8:15 PM      Result Value Ref Range   Valproic Acid Lvl 115.6 (*) 50.0 - 100.0 ug/mL   Comment: Performed at [facility]  GLUCOSE, CAPILLARY     Status: Abnormal   Collection Time    10/10/13  9:23 PM      Result Value Ref Range   Glucose-Capillary 103 (*) 70 - 99 mg/dL  GLUCOSE, CAPILLARY     Status: None   Collection Time    10/11/13  6:32 AM      Result Value Ref Range   Glucose-Capillary 83  70 - 99 mg/dL   Comment 1 Notify RN  GLUCOSE, CAPILLARY     Status: Abnormal   Collection Time    10/11/13 12:02 PM       Result Value Ref Range   Glucose-Capillary 101 (*) 70 - 99 mg/dL    Physical Findings: AIMS: Facial and Oral Movements Muscles of Facial Expression: None, normal Lips and Perioral Area: None, normal Jaw: None, normal Tongue: None, normal,Extremity Movements Upper (arms, wrists, hands, fingers): None, normal Lower (legs, knees, ankles, toes): None, normal, Trunk Movements Neck, shoulders, hips: None, normal, Overall Severity Severity of abnormal movements (highest score from questions above): None, normal Incapacitation due to abnormal movements: None, normal, Dental Status Current problems with teeth and/or dentures?: No Does patient usually wear dentures?: Yes  CIWA:  CIWA-Ar Total: 5 COWS:  COWS Total Score: 4  Treatment Plan Summary: Daily contact with patient to assess and evaluate symptoms and progress in treatment Medication management  Plan: Supportive approach/coping skills/relapse prevention           Depakote level 115. 6 ( usual blood level ) has not S/S of toxicity, no change in liver enzymes. Will continue due to the mood instability  Medical Decision Making Problem Points:  Review of psycho-social stressors (1) Data Points:  Review of medication regiment & side effects (2)  I certify that inpatient services furnished can reasonably be expected to improve the patient's condition.   Quindell Shere A 10/11/2013, 4:19 PM

## 2013-10-11 NOTE — Progress Notes (Signed)
Patient ID: Lynn Palmer, female   DOB: 1960/06/02, 53 y.o.   MRN: [id number] Pt laying in bed resting with eyes closed. Respirations even and unlabored. No distress noted. 15 min continued for safety.
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
## 2013-10-11 NOTE — Progress Notes (Signed)
Pt has been up for some of the groups today and for all her meals. She rated her depression a 5 hopelessness a 2 and anxiety a 5 on her self-inventory.  She denies any S/H ideation or A/V/H.  She remains labile at times but can calm herself fairly as compared to last week. She is looking to go to the recovery house possibly tomorrow.

## 2013-10-12 LAB — GLUCOSE, CAPILLARY: Glucose-Capillary: 118 mg/dL — ABNORMAL HIGH (ref 70–99)

## 2013-10-12 MED ORDER — HYDROXYZINE HCL 25 MG PO TABS: 25.0000 mg | ORAL_TABLET | Freq: Three times a day (TID) | ORAL | Status: DC | PRN

## 2013-10-12 MED ORDER — TRAMADOL HCL 50 MG PO TABS: 50.0000 mg | ORAL_TABLET | Freq: Three times a day (TID) | ORAL | Status: DC | PRN

## 2013-10-12 MED ORDER — FLUOXETINE HCL 20 MG PO TABS: 20.0000 mg | ORAL_TABLET | Freq: Every morning | ORAL | Status: DC

## 2013-10-12 MED ORDER — TRAZODONE HCL 100 MG PO TABS: 100.0000 mg | ORAL_TABLET | Freq: Every day | ORAL | Status: DC

## 2013-10-12 MED ORDER — DIVALPROEX SODIUM ER 500 MG PO TB24
500.0000 mg | ORAL_TABLET | Freq: Every day | ORAL | Status: DC
  Filled 2013-10-12 (×2): qty 14

## 2013-10-12 MED ORDER — METFORMIN HCL 500 MG PO TABS: 500.0000 mg | ORAL_TABLET | Freq: Two times a day (BID) | ORAL | Status: AC

## 2013-10-12 MED ORDER — PRAVASTATIN SODIUM 20 MG PO TABS: 20.0000 mg | ORAL_TABLET | Freq: Every evening | ORAL | Status: AC

## 2013-10-12 MED ORDER — AMLODIPINE BESYLATE 5 MG PO TABS: 5.0000 mg | ORAL_TABLET | Freq: Every morning | ORAL | Status: DC

## 2013-10-12 MED ORDER — DIVALPROEX SODIUM ER 500 MG PO TB24: ORAL_TABLET | ORAL | Status: DC

## 2013-10-12 MED ORDER — DIVALPROEX SODIUM ER 500 MG PO TB24
1000.0000 mg | ORAL_TABLET | Freq: Every day | ORAL | Status: DC
  Filled 2013-10-12: qty 28

## 2013-10-12 MED ORDER — METOPROLOL TARTRATE 25 MG PO TABS: 25.0000 mg | ORAL_TABLET | Freq: Two times a day (BID) | ORAL | Status: DC

## 2013-10-12 NOTE — Progress Notes (Signed)
[facility] :  Will you be returning to the same living situation after discharge: Yes,  home to recovery house At discharge, do you have transportation home?:Yes,  John picking pt up at 2pm Do you have the ability to pay for your medications:Yes,  [employer]  Release of information consent forms completed and submitted to Medical Records by CSW.  Patient to Follow up at: Follow-up Information   Follow up with [facility] On 10/13/2013. (Walk in for hospital discharge appointment, Monday - Friday 8 am - 3 pm. They will than schedule you for medication management and therapy. )    [address] information:   [address] [place], [place] [address] Phone: [phone] Fax: [phone]      Patient denies SI/HI:   Yes,  during group/self report.    Safety Planning and Suicide Prevention discussed:  Yes,  Pt refused to consent to family contact. SPE completed with pt. SPI pamphlet provided to pt and she was encouraged to share information with support network, ask questions, and talk about any concerns relating to SPE.  Smart, Haruko Mersch LCSWA 10/12/2013, 9:36 AM

## 2013-10-12 NOTE — [facility] Suicide Risk Assessment (Signed)
Suicide Risk Assessment  Discharge Assessment     Demographic Factors:  Caucasian  Total Time spent with patient: 45 minutes  Psychiatric Specialty Exam:     Blood pressure 157/84, pulse 69, temperature 98.2 F (36.8 C), temperature source Oral, resp. rate 16, height 5\' 1"  (1.549 m), weight 94.802 kg (209 lb), last menstrual period 11/21/2012, SpO2 98.00%.Body mass index is 39.51 kg/(m^2).  General Appearance: Fairly Groomed  Patent attorneyye Contact::  Fair  Speech:  Clear and Coherent  Volume:  Normal  Mood:  Anxious and worried  Affect:  anxious, worried  Thought Process:  Coherent and Goal Directed  Orientation:  Full (Time, Place, and Person)  Thought Content:  plans as she moves on, relapse prevention plan  Suicidal Thoughts:  No  Homicidal Thoughts:  No  Memory:  Immediate;   Fair Recent;   Fair Remote;   Fair  Judgement:  Fair  Insight:  Present and Shallow  Psychomotor Activity:  Restlessness  Concentration:  Fair  Recall:  [employer]  Fund of Knowledge:NA  Language: Fair  Akathisia:  No  Handed:    AIMS (if indicated):     Assets:  Desire for Improvement Housing  Sleep:  Number of Hours: 6    Musculoskeletal: Strength & Muscle Tone: decreased Gait & Station: unsteady Patient leans: N/A   Mental Status Per Nursing Assessment::   On Admission:  Self-harm thoughts  Current Mental Status by Physician: In full contact with reality. There are no active SI, plans or intent. Her mood is anxious as she goes back to the half way house and is conflicted as far as what to do with her BF.    Loss Factors: Loss of significant relationship and Decline in physical health  Historical Factors: NA  Risk Reduction Factors:  Living with another person, social support, connected to AA   Continued Clinical Symptoms:  Bipolar Disorder:   Depressive phase  Cognitive Features That Contribute To Risk:  Closed-mindedness Polarized thinking Thought constriction (tunnel vision)     Suicide Risk:  Minimal: No identifiable suicidal ideation.  Patients presenting with no risk factors but with morbid ruminations; may be classified as minimal risk based on the severity of the depressive symptoms  Discharge Diagnoses:   AXIS I:  Bipolar Disorder, depressed, Anxiety Disorder NOS AXIS II:  No diagnosis AXIS III:   Past Medical History  Diagnosis Date  . Bipolar 1 disorder   . Obesity   . Diabetes mellitus   . Hypertension   . Vomiting   . Substance abuse     alcohol   . Type II or unspecified type diabetes mellitus without mention of complication, not stated as uncontrolled   . Depression   . Hyperlipidemia   . Anxiety    AXIS IV:  other psychosocial or environmental problems AXIS V:  61-70 mild symptoms  Plan Of Care/Follow-up recommendations:  Activity:  as tolerated Diet:  as per dietitian Follow up outpatient basis/AA Is patient on multiple antipsychotic therapies at discharge:  No   Has Patient had three or more failed trials of antipsychotic monotherapy by history:  No  Recommended Plan for Multiple Antipsychotic Therapies: NA    Ericberto Padget A 10/12/2013, 1:00 PM

## 2013-10-12 NOTE — Discharge Summary (Signed)
Physician Discharge Summary Note  Patient:  Lynn Palmer is an 53 y.o., female MRN:  [id number] DOB:  02-05-1961 Patient phone:  [phone] (home)  Patient address:   [address]  [place] [id number],  Total Time spent with patient: Greater than 30 minutes  Date of Admission:  10/06/2013  Date of Discharge: 10/12/13  Reason for Admission: Mood stabilization  Discharge Diagnoses: Active Problems:   Bipolar affective disorder, depressed   Psychiatric Specialty Exam: Physical Exam  Psychiatric: Her speech is normal and behavior is normal. Judgment and thought content normal. Her mood appears not anxious. Her affect is not angry, not blunt, not labile and not inappropriate. Cognition and memory are normal. She does not exhibit a depressed mood.    Review of Systems  Constitutional: Negative.   HENT: Negative.   Eyes: Negative.   Respiratory: Negative.   Cardiovascular: Negative.   Gastrointestinal: Negative.   Genitourinary: Negative.   Musculoskeletal: Negative.   Skin: Negative.   Neurological: Negative.   Endo/Heme/Allergies: Negative.   Psychiatric/Behavioral: Positive for depression (Stable) and substance abuse (Hx polysubstance dependence). Negative for suicidal ideas, hallucinations and memory loss. The patient has insomnia (Stable). The patient is not nervous/anxious.     Blood pressure 157/84, pulse 69, temperature 98.2 F (36.8 C), temperature source Oral, resp. rate 16, height 5\' 1"  (1.549 m), weight 94.802 kg (209 lb), last menstrual period 11/21/2012, SpO2 98.00%.Body mass index is 39.51 kg/(m^2).   General Appearance: Fairly Groomed   Patent attorneyye Contact:: Fair   Speech: Clear and Coherent   Volume: Normal   Mood: Anxious and worried   Affect: anxious, worried   Thought Process: Coherent and Goal Directed   Orientation: Full (Time, Place, and Person)   Thought Content: plans as she moves on, relapse prevention plan   Suicidal Thoughts: No   Homicidal Thoughts:  No   Memory: Immediate; Fair  Recent; Fair  Remote; Fair   Judgement: Fair   Insight: Present and Shallow   Psychomotor Activity: Restlessness   Concentration: Fair   Recall: [employer]   Fund of Knowledge:NA   Language: Fair   Akathisia: No   Handed:   AIMS (if indicated):   Assets: Desire for Improvement  Housing   Sleep: Number of Hours: 6    Past Psychiatric History: Diagnosis: Bipolar affective disorder, depressed  [facility]  Outpatient Care: [facility]  Substance Abuse Care: [facility]  Self-Mutilation: NA  Suicidal Attempts: NA  Violent Behaviors: NA   Musculoskeletal: Strength & Muscle Tone: within normal limits Gait & Station: normal Patient leans: N/A  DSM5: Schizophrenia Disorders:  NA Obsessive-Compulsive Disorders:  NA Trauma-Stressor Disorders:  NA Substance/Addictive Disorders:  Polysubstance dependence Depressive Disorders:  Bipolar affective disorder, depressed   Axis Diagnosis:  AXIS I:  Bipolar affective disorder, depressed AXIS II:  Deferred AXIS III:   Past Medical History  Diagnosis Date  . Bipolar 1 disorder   . Obesity   . Diabetes mellitus   . Hypertension   . Vomiting   . Substance abuse     alcohol   . Type II or unspecified type diabetes mellitus without mention of complication, not stated as uncontrolled   . Depression   . Hyperlipidemia   . Anxiety    AXIS IV:  other psychosocial or environmental problems and Mental illness, chronic, Hx. polysubstance dependence AXIS V:  62  Level of Care:  OP  Hospital Course: 53 Y/o female who states she "cant go on living anymore". States she [facility]  the [facility]. She did well, she thought she could do it on her own. She got out went with "[name]." States his mother became his payee and told them they could not stay together. States she was staying at her daughters house. States she was expected to watch her kids and she could not do it. She has fallen on the  steps. She admits to having been in pain States she cant do it anymore. States her mother told her she was not her daughter anymore. "Do not want to see you again".   Lynn Palmer was admitted to the hospital with an unstable mood that was escalating. [facility], Lynn Palmer was agitated and disruptive that the crisis/emergency response team has to be alerted. She was medicated with haldol 5 mg and Ativan 2 mg injectables to control her agitation. Lynn Palmer received mood stabilization treatments throughout the rest of her hospital stay. She did not receive any detoxification treatments as her UDS test reports [facility]. Lynn Palmer was medicated and discharged on Depakote 500 mg Q am & 100 mg Q bedtime for mood stabilization, Fluoxetine 20 mg daily for depression, hydroxyzine 25 mg three times daily as needed for anxiety. Lorazepam 1 mg tablet as needed for severe anxiety and Trazodone 100 mg Q bedtime for sleep. She was also resumed on all her pertinent home medications for her other pre-existing medical issues. She tolerated her treatment regimen without any significant adverse effects or reactions. She was enrolled in the group counseling sessions and AA/NA meetings being offered and held on this unit. She learned coping skills.  Lynn Palmer has shown stable mood. This is evidenced by her reports of improved mood, presentation of good affects and eye contact. She is currently being discharged to [facility] psychiatric [facility] [place], [place]. She is provided with all the necessary information required to make this appointment without problems. Upon discharge, she adamantly denies any SIHI, AVH, delusional thoughts, paranoia and or withdrawal symptoms. [facility] 14 days worth, [facility]. [facility] belongings in no appaprent distress. Transportation per Jonny RuizJohn  [facility]. Depakote levels: 115.6.  Consults:  psychiatry  Significant Diagnostic Studies:  labs: CBC with diff, CMP, UDS, toxicology tests, U/A  Discharge Vitals:   Blood pressure 157/84, pulse 69, temperature 98.2 F (36.8 C), temperature source Oral, resp. rate 16, height 5\' 1"  (1.549 m), weight 94.802 kg (209 lb), last menstrual period 11/21/2012, SpO2 98.00%. Body mass index is 39.51 kg/(m^2). Lab Results:   Results for orders placed during the hospital encounter of 10/06/13 (from the past 72 hour(s))  GLUCOSE, CAPILLARY     Status: Abnormal   Collection Time    10/09/13 11:51 AM      Result Value Ref Range   Glucose-Capillary 61 (*) 70 - 99 mg/dL  GLUCOSE, CAPILLARY     Status: Abnormal   Collection Time    10/09/13  1:05 PM      Result Value Ref Range   Glucose-Capillary 68 (*) 70 - 99 mg/dL  GLUCOSE, CAPILLARY     Status: None   Collection Time    10/09/13  1:28 PM      Result Value Ref Range   Glucose-Capillary 70  70 - 99 mg/dL  GLUCOSE, CAPILLARY     Status: Abnormal   Collection Time  10/09/13  5:02 PM      Result Value Ref Range   Glucose-Capillary 158 (*) 70 - 99 mg/dL  GLUCOSE, CAPILLARY     Status: None   Collection Time    10/09/13 10:04 PM      Result Value Ref Range   Glucose-Capillary 99  70 - 99 mg/dL   Comment 1 Notify RN    GLUCOSE, CAPILLARY     Status: None   Collection Time    10/10/13  6:40 AM      Result Value Ref Range   Glucose-Capillary 78  70 - 99 mg/dL   Comment 1 Notify RN    GLUCOSE, CAPILLARY     Status: Abnormal   Collection Time    10/10/13 12:06 PM      Result Value Ref Range   Glucose-Capillary 113 (*) 70 - 99 mg/dL  GLUCOSE, CAPILLARY     Status: Abnormal   Collection Time    10/10/13  5:01 PM      Result Value Ref Range   Glucose-Capillary 101 (*) 70 - 99 mg/dL  VALPROIC ACID LEVEL     Status: Abnormal   Collection Time    10/10/13  8:15 PM      Result Value Ref Range   Valproic Acid Lvl 115.6 (*) 50.0  - 100.0 ug/mL   Comment: Performed at [facility]  GLUCOSE, CAPILLARY     Status: Abnormal   Collection Time    10/10/13  9:23 PM      Result Value Ref Range   Glucose-Capillary 103 (*) 70 - 99 mg/dL  GLUCOSE, CAPILLARY     Status: None   Collection Time    10/11/13  6:32 AM      Result Value Ref Range   Glucose-Capillary 83  70 - 99 mg/dL   Comment 1 Notify RN    GLUCOSE, CAPILLARY     Status: Abnormal   Collection Time    10/11/13 12:02 PM      Result Value Ref Range   Glucose-Capillary 101 (*) 70 - 99 mg/dL  GLUCOSE, CAPILLARY     Status: Abnormal   Collection Time    10/11/13  4:49 PM      Result Value Ref Range   Glucose-Capillary 138 (*) 70 - 99 mg/dL  GLUCOSE, CAPILLARY     Status: Abnormal   Collection Time    10/11/13  9:56 PM      Result Value Ref Range   Glucose-Capillary 105 (*) 70 - 99 mg/dL  GLUCOSE, CAPILLARY     Status: Abnormal   Collection Time    10/12/13  8:17 AM      Result Value Ref Range   Glucose-Capillary 118 (*) 70 - 99 mg/dL    Physical Findings: AIMS: Facial and Oral Movements Muscles of Facial Expression: None, normal Lips and Perioral Area: None, normal Jaw: None, normal Tongue: None, normal,Extremity Movements Upper (arms, wrists, hands, fingers): None, normal Lower (legs, knees, ankles, toes): None, normal, Trunk Movements Neck, shoulders, hips: None, normal, Overall Severity Severity of abnormal movements (highest score from questions above): None, normal Incapacitation due to abnormal movements: None, normal Patient's awareness of abnormal movements (rate only patient's report): No Awareness, Dental Status Current problems with teeth and/or dentures?: No Does patient usually wear dentures?: Yes  CIWA:  CIWA-Ar Total: 5 COWS:  COWS Total Score: 4  Psychiatric Specialty Exam: See Psychiatric Specialty Exam and Suicide Risk Assessment completed by Attending Physician prior to discharge.  Discharge destination:  Home  Is  patient on multiple antipsychotic therapies at discharge:  No   Has Patient had three or more failed trials of antipsychotic monotherapy by history:  No  Recommended Plan for Multiple Antipsychotic Therapies: NA    Medication List       Indication   amLODipine 5 MG tablet  Commonly known as:  NORVASC  Take 1 tablet (5 mg total) by mouth every morning. For high blood pressure   Indication:  High Blood Pressure     divalproex 500 MG 24 hr tablet  Commonly known as:  DEPAKOTE ER  Take 1 tablet (500 mg) in a.m & 2 tablets (1,000 mg) at bedtime: For mood stabilization   Indication:  Mood stabilization     FLUoxetine 20 MG tablet  Commonly known as:  PROZAC  Take 1 tablet (20 mg total) by mouth every morning. For depression   Indication:  Major Depressive Disorder     hydrOXYzine 25 MG tablet  Commonly known as:  ATARAX/VISTARIL  Take 1 tablet (25 mg total) by mouth 3 (three) times daily as needed for anxiety.   Indication:  Anxiety/tension     metFORMIN 500 MG tablet  Commonly known as:  GLUCOPHAGE  Take 1 tablet (500 mg total) by mouth 2 (two) times daily with a meal. For diabetes   Indication:  Type 2 Diabetes     metoprolol tartrate 25 MG tablet  Commonly known as:  LOPRESSOR  Take 1 tablet (25 mg total) by mouth 2 (two) times daily. For hypertension   Indication:  High Blood Pressure     pravastatin 20 MG tablet  Commonly known as:  PRAVACHOL  Take 1 tablet (20 mg total) by mouth every evening. For high cholesterol   Indication:  Inherited Heterozygous Hypercholesterolemia, Nonfamilial Heterozygous Hypercholesterolemia     traMADol 50 MG tablet  Commonly known as:  ULTRAM  Take 1 tablet (50 mg total) by mouth 3 (three) times daily as needed for moderate pain.   Indication:  Moderate to Moderately Severe Pain     traZODone 100 MG tablet  Commonly known as:  DESYREL  Take 1 tablet (100 mg total) by mouth at bedtime. For sleep   Indication:  Trouble Sleeping        Follow-up Information   Follow up with [facility] On 10/13/2013. (Walk in for hospital discharge appointment, Monday - Friday 8 am - 3 pm. They will than schedule you for medication management and therapy. )    Contact information:   [address], [place] [address] Phone: [phone] Fax: [phone]     Follow-up recommendations: Activity:  As tolerated Diet: As recommended by your primary care doctor. Keep all scheduled follow-up appointments as recommended.   Comments: Take all your medications as prescribed by your mental healthcare provider. Report any adverse effects and or reactions from your medicines to your outpatient provider promptly. Patient is instructed and cautioned to not engage in alcohol and or illegal drug use while on prescription medicines. In the event of worsening symptoms, patient is instructed to call the crisis hotline, 911 and or go to the nearest ED for appropriate evaluation and treatment of symptoms. Follow-up with your primary care provider for your other medical issues, concerns and or health care needs.   Total Discharge Time:  Greater than 30 minutes.  Signed: Sanjuana Kava, PMHNP-BC 10/12/2013, 10:25 AM  I personally assessed the patient and formulated the plan Madie Reno A. Dub Mikes, M.D.

## 2013-10-12 NOTE — [facility] Group Notes (Signed)
0900 nursing orientation group    The focus of this group is to educate the patient on the purpose and policies of crisis stabilization and provide a format to answer questions about their admission.  The group details unit policies and expectations of patients while admitted.  Pt was an active participant in group and appropriate with sharing her goal for today"to leave here today and get her belongings from her daughter's house"

## 2013-10-17 NOTE — Progress Notes (Signed)
Patient Discharge Instructions:  After Visit Summary (AVS):   Faxed to:  10/17/13 Discharge Summary Note:   Faxed to:  10/17/13 Psychiatric Admission Assessment Note:   Faxed to:  10/17/13 Suicide Risk Assessment - Discharge Assessment:   Faxed to:  10/17/13 Faxed/Sent to the Next Level Care provider:  10/17/13 [facility] to [facility] @ [phone]  Jerelene ReddenSheena E LaPorte, 10/17/2013, 3:01 PM

## 2013-10-21 ENCOUNTER — Other Ambulatory Visit: Payer: Self-pay | Admitting: Physician Assistant

## 2013-10-30 ENCOUNTER — Ambulatory Visit: Payer: Self-pay | Admitting: Internal Medicine

## 2013-11-02 ENCOUNTER — Telehealth: Payer: Self-pay | Admitting: Internal Medicine

## 2013-11-02 NOTE — Telephone Encounter (Signed)
FYI.Marland Kitchen.Marland Kitchen.  PAIN MANAGEMENT REFERRAL DR CALLED TO SAY PATIENT WAS DISCHARGED AFTER 4 NO SHOWS.  Thank you, Katrina Webb SilversmithWelch [facility] [phone] Fax [phone]

## 2013-11-07 NOTE — Progress Notes (Signed)
This encounter was created in error - please disregard.

## 2013-11-13 ENCOUNTER — Encounter (HOSPITAL_COMMUNITY): Payer: Self-pay | Admitting: Emergency Medicine

## 2013-11-13 ENCOUNTER — Inpatient Hospital Stay (HOSPITAL_COMMUNITY)
Admission: EM | Admit: 2013-11-13 | Discharge: 2013-11-15 | DRG: 917 | Disposition: A | Discharge status: Other Acute Inpatient Hospital | Payer: Medicare Other | Attending: Internal Medicine | Admitting: Internal Medicine

## 2013-11-13 ENCOUNTER — Emergency Department (HOSPITAL_COMMUNITY): Payer: Medicare Other

## 2013-11-13 DIAGNOSIS — J9600 Acute respiratory failure, unspecified whether with hypoxia or hypercapnia: Secondary | ICD-10-CM | POA: Diagnosis present

## 2013-11-13 DIAGNOSIS — F1210 Cannabis abuse, uncomplicated: Secondary | ICD-10-CM | POA: Diagnosis present

## 2013-11-13 DIAGNOSIS — G92 Toxic encephalopathy: Secondary | ICD-10-CM | POA: Diagnosis present

## 2013-11-13 DIAGNOSIS — Z8249 Family history of ischemic heart disease and other diseases of the circulatory system: Secondary | ICD-10-CM

## 2013-11-13 DIAGNOSIS — T438X2A Poisoning by other psychotropic drugs, intentional self-harm, initial encounter: Secondary | ICD-10-CM | POA: Diagnosis present

## 2013-11-13 DIAGNOSIS — M545 Low back pain, unspecified: Secondary | ICD-10-CM

## 2013-11-13 DIAGNOSIS — E669 Obesity, unspecified: Secondary | ICD-10-CM | POA: Diagnosis present

## 2013-11-13 DIAGNOSIS — F1310 Sedative, hypnotic or anxiolytic abuse, uncomplicated: Secondary | ICD-10-CM | POA: Diagnosis present

## 2013-11-13 DIAGNOSIS — F1010 Alcohol abuse, uncomplicated: Secondary | ICD-10-CM | POA: Diagnosis present

## 2013-11-13 DIAGNOSIS — E785 Hyperlipidemia, unspecified: Secondary | ICD-10-CM | POA: Diagnosis present

## 2013-11-13 DIAGNOSIS — F1994 Other psychoactive substance use, unspecified with psychoactive substance-induced mood disorder: Secondary | ICD-10-CM | POA: Diagnosis present

## 2013-11-13 DIAGNOSIS — Z8261 Family history of arthritis: Secondary | ICD-10-CM | POA: Diagnosis not present

## 2013-11-13 DIAGNOSIS — G929 Unspecified toxic encephalopathy: Secondary | ICD-10-CM | POA: Diagnosis present

## 2013-11-13 DIAGNOSIS — I10 Essential (primary) hypertension: Secondary | ICD-10-CM | POA: Diagnosis present

## 2013-11-13 DIAGNOSIS — R4182 Altered mental status, unspecified: Secondary | ICD-10-CM | POA: Diagnosis present

## 2013-11-13 DIAGNOSIS — I959 Hypotension, unspecified: Secondary | ICD-10-CM | POA: Diagnosis present

## 2013-11-13 DIAGNOSIS — R509 Fever, unspecified: Secondary | ICD-10-CM | POA: Diagnosis present

## 2013-11-13 DIAGNOSIS — T43592A Poisoning by other antipsychotics and neuroleptics, intentional self-harm, initial encounter: Secondary | ICD-10-CM

## 2013-11-13 DIAGNOSIS — F3130 Bipolar disorder, current episode depressed, mild or moderate severity, unspecified: Secondary | ICD-10-CM | POA: Diagnosis present

## 2013-11-13 DIAGNOSIS — D638 Anemia in other chronic diseases classified elsewhere: Secondary | ICD-10-CM | POA: Diagnosis present

## 2013-11-13 DIAGNOSIS — Z79899 Other long term (current) drug therapy: Secondary | ICD-10-CM | POA: Diagnosis not present

## 2013-11-13 DIAGNOSIS — F3132 Bipolar disorder, current episode depressed, moderate: Secondary | ICD-10-CM

## 2013-11-13 DIAGNOSIS — N182 Chronic kidney disease, stage 2 (mild): Secondary | ICD-10-CM

## 2013-11-13 DIAGNOSIS — Z6835 Body mass index (BMI) 35.0-35.9, adult: Secondary | ICD-10-CM

## 2013-11-13 DIAGNOSIS — F411 Generalized anxiety disorder: Secondary | ICD-10-CM | POA: Diagnosis present

## 2013-11-13 DIAGNOSIS — T50902A Poisoning by unspecified drugs, medicaments and biological substances, intentional self-harm, initial encounter: Secondary | ICD-10-CM

## 2013-11-13 DIAGNOSIS — T424X4A Poisoning by benzodiazepines, undetermined, initial encounter: Principal | ICD-10-CM | POA: Diagnosis present

## 2013-11-13 DIAGNOSIS — E119 Type 2 diabetes mellitus without complications: Secondary | ICD-10-CM | POA: Diagnosis present

## 2013-11-13 DIAGNOSIS — T43502A Poisoning by unspecified antipsychotics and neuroleptics, intentional self-harm, initial encounter: Secondary | ICD-10-CM | POA: Diagnosis present

## 2013-11-13 DIAGNOSIS — E1121 Type 2 diabetes mellitus with diabetic nephropathy: Secondary | ICD-10-CM

## 2013-11-13 DIAGNOSIS — F419 Anxiety disorder, unspecified: Secondary | ICD-10-CM

## 2013-11-13 DIAGNOSIS — F1910 Other psychoactive substance abuse, uncomplicated: Secondary | ICD-10-CM

## 2013-11-13 DIAGNOSIS — T50901A Poisoning by unspecified drugs, medicaments and biological substances, accidental (unintentional), initial encounter: Secondary | ICD-10-CM

## 2013-11-13 LAB — URINALYSIS, ROUTINE W REFLEX MICROSCOPIC
GLUCOSE, UA: NEGATIVE mg/dL
Hgb urine dipstick: NEGATIVE
Ketones, ur: NEGATIVE mg/dL
LEUKOCYTES UA: NEGATIVE
Nitrite: NEGATIVE
Protein, ur: NEGATIVE mg/dL
Specific Gravity, Urine: 1.027 (ref 1.005–1.030)
UROBILINOGEN UA: 0.2 mg/dL (ref 0.0–1.0)
pH: 6 (ref 5.0–8.0)

## 2013-11-13 LAB — CBC
HEMATOCRIT: 33 % — AB (ref 36.0–46.0)
Hemoglobin: 10.6 g/dL — ABNORMAL LOW (ref 12.0–15.0)
MCH: 28 pg (ref 26.0–34.0)
MCHC: 32.1 g/dL (ref 30.0–36.0)
MCV: 87.1 fL (ref 78.0–100.0)
Platelets: 163 10*3/uL (ref 150–400)
RBC: 3.79 MIL/uL — ABNORMAL LOW (ref 3.87–5.11)
RDW: 14.5 % (ref 11.5–15.5)
WBC: 8.5 10*3/uL (ref 4.0–10.5)

## 2013-11-13 LAB — RAPID URINE DRUG SCREEN, HOSP PERFORMED
Amphetamines: NOT DETECTED
BARBITURATES: NOT DETECTED
Benzodiazepines: POSITIVE — AB
COCAINE: NOT DETECTED
Opiates: POSITIVE — AB
TETRAHYDROCANNABINOL: POSITIVE — AB

## 2013-11-13 LAB — SALICYLATE LEVEL: Salicylate Lvl: <2 mg/dL — ABNORMAL LOW (ref 2.8–20.0)

## 2013-11-13 LAB — COMPREHENSIVE METABOLIC PANEL
ALBUMIN: 2.8 g/dL — AB (ref 3.5–5.2)
ALK PHOS: 70 U/L (ref 39–117)
ALT: 9 U/L (ref 0–35)
ANION GAP: 10 (ref 5–15)
AST: 17 U/L (ref 0–37)
BILIRUBIN TOTAL: 0.6 mg/dL (ref 0.3–1.2)
BUN: 14 mg/dL (ref 6–23)
CHLORIDE: 101 meq/L (ref 96–112)
CO2: 25 meq/L (ref 19–32)
Calcium: 8.7 mg/dL (ref 8.4–10.5)
Creatinine, Ser: 1.05 mg/dL (ref 0.50–1.10)
GFR calc Af Amer: 69 mL/min — ABNORMAL LOW (ref >90)
GFR, EST NON AFRICAN AMERICAN: 60 mL/min — AB (ref >90)
Glucose, Bld: 120 mg/dL — ABNORMAL HIGH (ref 70–99)
Potassium: 4 mEq/L (ref 3.7–5.3)
Sodium: 136 mEq/L — ABNORMAL LOW (ref 137–147)
Total Protein: 5.9 g/dL — ABNORMAL LOW (ref 6.0–8.3)

## 2013-11-13 LAB — CK: Total CK: 204 U/L — ABNORMAL HIGH (ref 7–177)

## 2013-11-13 LAB — VALPROIC ACID LEVEL: VALPROIC ACID LVL: 45.8 ug/mL — AB (ref 50.0–100.0)

## 2013-11-13 LAB — CBG MONITORING, ED: GLUCOSE-CAPILLARY: 114 mg/dL — AB (ref 70–99)

## 2013-11-13 LAB — MRSA PCR SCREENING: MRSA by PCR: NEGATIVE

## 2013-11-13 LAB — ACETAMINOPHEN LEVEL: Acetaminophen (Tylenol), Serum: <15 ug/mL (ref 10–30)

## 2013-11-13 LAB — ETHANOL

## 2013-11-13 MED ORDER — DIVALPROEX SODIUM ER 500 MG PO TB24
500.0000 mg | ORAL_TABLET | Freq: Every day | ORAL | Status: DC
  Administered 2013-11-13 – 2013-11-14 (×2): 500 mg via ORAL
  Filled 2013-11-13 (×2): qty 1

## 2013-11-13 MED ORDER — ENOXAPARIN SODIUM 40 MG/0.4ML ~~LOC~~ SOLN
40.0000 mg | SUBCUTANEOUS | Status: DC
  Administered 2013-11-13 – 2013-11-14 (×2): 40 mg via SUBCUTANEOUS
  Filled 2013-11-13 (×4): qty 0.4

## 2013-11-13 MED ORDER — NALOXONE HCL 0.4 MG/ML IJ SOLN
0.4000 mg | Freq: Once | INTRAMUSCULAR | Status: AC
  Administered 2013-11-13: 0.4 mg via INTRAVENOUS
  Filled 2013-11-13: qty 1

## 2013-11-13 MED ORDER — VITAMIN B-1 100 MG PO TABS
100.0000 mg | ORAL_TABLET | Freq: Every day | ORAL | Status: DC
  Administered 2013-11-13 – 2013-11-14 (×2): 100 mg via ORAL
  Filled 2013-11-13 (×3): qty 1

## 2013-11-13 MED ORDER — ADULT MULTIVITAMIN W/MINERALS CH
1.0000 | ORAL_TABLET | Freq: Every day | ORAL | Status: DC
  Administered 2013-11-13 – 2013-11-14 (×2): 1 via ORAL
  Filled 2013-11-13 (×3): qty 1

## 2013-11-13 MED ORDER — ONDANSETRON HCL 4 MG PO TABS: 4.0000 mg | ORAL_TABLET | Freq: Four times a day (QID) | ORAL | Status: DC | PRN

## 2013-11-13 MED ORDER — INSULIN ASPART 100 UNIT/ML ~~LOC~~ SOLN
0.0000 [IU] | Freq: Three times a day (TID) | SUBCUTANEOUS | Status: DC
  Administered 2013-11-14: 2 [IU] via SUBCUTANEOUS

## 2013-11-13 MED ORDER — SODIUM CHLORIDE 0.9 % IV BOLUS (SEPSIS)
1000.0000 mL | Freq: Once | INTRAVENOUS | Status: AC
  Administered 2013-11-13: 1000 mL via INTRAVENOUS

## 2013-11-13 MED ORDER — SODIUM CHLORIDE 0.9 % IV SOLN
INTRAVENOUS | Status: DC
  Administered 2013-11-13: 1000 mL via INTRAVENOUS

## 2013-11-13 MED ORDER — FOLIC ACID 1 MG PO TABS
1.0000 mg | ORAL_TABLET | Freq: Every day | ORAL | Status: DC
  Administered 2013-11-13 – 2013-11-15 (×3): 1 mg via ORAL
  Filled 2013-11-13 (×3): qty 1

## 2013-11-13 MED ORDER — ONDANSETRON HCL 4 MG/2ML IJ SOLN
4.0000 mg | Freq: Four times a day (QID) | INTRAMUSCULAR | Status: DC | PRN
  Administered 2013-11-14: 4 mg via INTRAVENOUS
  Filled 2013-11-13: qty 2

## 2013-11-13 NOTE — ED Notes (Signed)
3 staff members attempted to ambulate pt, pt took 3 steps, crying the entire time. Reports her landlord kicked her out. Pt had urinated on herself.

## 2013-11-13 NOTE — ED Notes (Signed)
md at bedside  Pt much more alert and talkative after 2nd dose of narcan. Pt reports she wishes she would go to sleep and never wake up. Pt is SI

## 2013-11-13 NOTE — ED Provider Notes (Signed)
No definite etiology other than overdose., Persistently hypoxic and altered, admitted to the hospitalist Dr. Lenise ArenaMeyers.  Lynn RollerBrian D Alphonsus Doyel, MD 11/13/13 2002

## 2013-11-13 NOTE — ED Notes (Signed)
Bed: WA08 Expected date: 11/13/13 Expected time: 11:32 AM Means of arrival: Ambulance Comments: Lethargic; found outside

## 2013-11-13 NOTE — ED Notes (Signed)
Poison control called, spoke with mary ann Advises continuous cardiac monitoring, no to charcoal because of risk of aspiration, supportive care until pt returns to baseline. Rule out tylenol and check liver enzymes If pt has seizures 1st line of treatment is benzos, 2nd line is pheno Suspects pt took something else other than klonopin to have pin point pupils.

## 2013-11-13 NOTE — ED Provider Notes (Signed)
CSN: [id number]     Arrival date & time 11/13/13  1150 History   First MD Initiated Contact with Patient 11/13/13 1156     Chief Complaint  Patient presents with  . Drug Overdose  . suicidal      (Consider location/radiation/quality/duration/timing/severity/associated sxs/prior Treatment) Patient is a 53 y.o. female presenting with Overdose. The history is provided by the patient.  Drug Overdose This is a new problem. The current episode started 1 to 2 hours ago. The problem occurs constantly. The problem has been resolved. Pertinent negatives include no chest pain and no abdominal pain. Nothing aggravates the symptoms. Nothing relieves the symptoms.    Past Medical History  Diagnosis Date  . Bipolar 1 disorder   . Obesity   . Diabetes mellitus   . Hypertension   . Vomiting   . Substance abuse     alcohol   . Type II or unspecified type diabetes mellitus without mention of complication, not stated as uncontrolled   . Depression   . Hyperlipidemia   . Anxiety    Past Surgical History  Procedure Laterality Date  . Tonsillectomy    . Laparoscopic gastric banding  06/11/09  . Vaginal delivery  1987   Family History  Problem Relation Age of Onset  . Osteoarthritis Mother   . Heart failure Father   . Osteoarthritis Father   . Alcohol abuse Father    History  Substance Use Topics  . Smoking status: Never Smoker   . Smokeless tobacco: Never Used  . Alcohol Use: 0.0 oz/week     Comment: social/ denies present use   OB History   Grav Para Term Preterm Abortions TAB SAB Ect Mult Living                 Review of Systems  Unable to perform ROS: Mental status change  Cardiovascular: Negative for chest pain.  Gastrointestinal: Negative for abdominal pain.      Allergies  Review of patient's allergies indicates no known allergies.  Home Medications   Prior to Admission medications   Medication Sig Start Date End Date Taking? Authorizing Provider  amLODipine  (NORVASC) 5 MG tablet Take 1 tablet (5 mg total) by mouth every morning. For high blood pressure 10/12/13   Sanjuana Kava, NP  divalproex (DEPAKOTE ER) 500 MG 24 hr tablet Take 1 tablet (500 mg) in a.m & 2 tablets (1,000 mg) at bedtime: For mood stabilization 10/12/13   Sanjuana Kava, NP  FLUoxetine (PROZAC) 20 MG tablet Take 1 tablet (20 mg total) by mouth every morning. For depression 10/12/13   Sanjuana Kava, NP  hydrOXYzine (ATARAX/VISTARIL) 25 MG tablet Take 1 tablet (25 mg total) by mouth 3 (three) times daily as needed for anxiety. 10/12/13   Sanjuana Kava, NP  metFORMIN (GLUCOPHAGE) 500 MG tablet Take 1 tablet (500 mg total) by mouth 2 (two) times daily with a meal. For diabetes 10/12/13   Sanjuana Kava, NP  metoprolol tartrate (LOPRESSOR) 25 MG tablet Take 1 tablet (25 mg total) by mouth 2 (two) times daily. For hypertension 10/12/13   Sanjuana Kava, NP  pravastatin (PRAVACHOL) 20 MG tablet Take 1 tablet (20 mg total) by mouth every evening. For high cholesterol 10/12/13   Sanjuana Kava, NP  traMADol (ULTRAM) 50 MG tablet Take 1 tablet (50 mg total) by mouth 3 (three) times daily as needed for moderate pain. 10/12/13   Sanjuana Kava, NP  traZODone (DESYREL) 100 MG  tablet Take 1 tablet (100 mg total) by mouth at bedtime. For sleep 10/12/13   Sanjuana Kava, NP   BP 110/58  Pulse 73  Temp(Src) 100.8 F (38.2 C) (Rectal)  Resp 15  SpO2 94%  LMP 11/21/2012 Physical Exam  Nursing note and vitals reviewed. Constitutional: She is oriented to person, place, and time. She appears well-developed and well-nourished. She appears lethargic. No distress.  HENT:  Head: Normocephalic and atraumatic.  Mouth/Throat: Oropharynx is clear and moist.  Eyes: EOM are normal. Pupils are equal, round, and reactive to light.  Neck: Normal range of motion. Neck supple.  Cardiovascular: Normal rate and regular rhythm.  Exam reveals no friction rub.   No murmur heard. Pulmonary/Chest: Effort normal and breath sounds normal. No  respiratory distress. She has no wheezes. She has no rales.  Abdominal: Soft. She exhibits no distension. There is no tenderness. There is no rebound.  Musculoskeletal: Normal range of motion. She exhibits no edema.  Neurological: She is oriented to person, place, and time. She appears lethargic. No cranial nerve deficit or sensory deficit. She exhibits normal muscle tone. GCS eye subscore is 3. GCS verbal subscore is 5. GCS motor subscore is 6.  Skin: No rash noted. She is not diaphoretic.    ED Course  Procedures (including critical care time) Labs Review Labs Reviewed  CBC - Abnormal; Notable for the following:    RBC 3.79 (*)    Hemoglobin 10.6 (*)    HCT 33.0 (*)    All other components within normal limits  CBG MONITORING, ED - Abnormal; Notable for the following:    Glucose-Capillary 114 (*)    All other components within normal limits  ACETAMINOPHEN LEVEL  COMPREHENSIVE METABOLIC PANEL  ETHANOL  SALICYLATE LEVEL  URINE RAPID DRUG SCREEN (HOSP PERFORMED)  CK    Imaging Review No results found.   EKG Interpretation   Date/Time:  Monday November 13 2013 11:53:23 EDT Ventricular Rate:  76 PR Interval:  152 QRS Duration: 93 QT Interval:  391 QTC Calculation: 440 R Axis:   18 Text Interpretation:  Sinus rhythm Similar to prior Confirmed by Gwendolyn Grant   MD, Keahi Mccarney ([id number]) on 11/13/2013 12:07:28 PM      MDM   Final diagnoses:  Overdose, intentional self-harm, initial encounter  Bipolar affective disorder, current episode depressed, current episode severity unspecified  Hyperlipidemia  Essential hypertension  Type II or unspecified type diabetes mellitus without mention of complication, not stated as uncontrolled  Polysubstance abuse    53 year old female comes in after an overdose. She told me she took Klonopin roughly 45 minutes ago. She reports she took about 10 Klonopin. She was found down outside and have been there for a long time. Unknown if she actually knows what  she took medicines. She states she took them with intent to hurt herself is a poor yelling at her. Patient is here lethargic, she wakens to light stimulation and does her know where she is and what year it is. She is hypoxic on room air, 88%. She is satting fine on 2 L. Pupils 2 mm, reactive.  She is febrile here. I spoke with Poison Control - recommended checking level of meds as her presentation doesn't fit benzo overdose with her small pupils. Will try Narcan. No charcoal recommended. Blood and urine cultures sent.  Fever improved with fluids, no antipyretics given. Labs unremarkable. At 4 hours after arrival, roughly 5 hours after ingestion, still descending room air. Digit span positive for opiates,  benzos, THC. This is likely a mixed ingestion. She still rows of the speech, no need for Narcan the Narcan drip this time as she is protecting her airway. Will continue observation. Dr. Hyacinth MeekerMiller to give a few extra hours observation.  Dagmar HaitWilliam Deyjah Kindel, MD 11/14/13 [phone]

## 2013-11-13 NOTE — ED Notes (Signed)
Pt to CT
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
## 2013-11-13 NOTE — ED Notes (Signed)
Patient and her belongings have been wanded by security. Patient has one bag with shirt, pants, and medications in it. RN aware of meds in bag. Patient bag has been placed in TCU locker [id number]

## 2013-11-14 DIAGNOSIS — F3130 Bipolar disorder, current episode depressed, mild or moderate severity, unspecified: Secondary | ICD-10-CM

## 2013-11-14 DIAGNOSIS — I10 Essential (primary) hypertension: Secondary | ICD-10-CM

## 2013-11-14 DIAGNOSIS — E119 Type 2 diabetes mellitus without complications: Secondary | ICD-10-CM

## 2013-11-14 DIAGNOSIS — E785 Hyperlipidemia, unspecified: Secondary | ICD-10-CM

## 2013-11-14 DIAGNOSIS — R45851 Suicidal ideations: Secondary | ICD-10-CM

## 2013-11-14 DIAGNOSIS — T50901A Poisoning by unspecified drugs, medicaments and biological substances, accidental (unintentional), initial encounter: Secondary | ICD-10-CM

## 2013-11-14 DIAGNOSIS — F1910 Other psychoactive substance abuse, uncomplicated: Secondary | ICD-10-CM

## 2013-11-14 DIAGNOSIS — F1994 Other psychoactive substance use, unspecified with psychoactive substance-induced mood disorder: Secondary | ICD-10-CM

## 2013-11-14 LAB — BASIC METABOLIC PANEL
Anion gap: 7 (ref 5–15)
BUN: 10 mg/dL (ref 6–23)
CO2: 27 mEq/L (ref 19–32)
CREATININE: 0.89 mg/dL (ref 0.50–1.10)
Calcium: 8.8 mg/dL (ref 8.4–10.5)
Chloride: 105 mEq/L (ref 96–112)
GFR, EST AFRICAN AMERICAN: 84 mL/min — AB (ref >90)
GFR, EST NON AFRICAN AMERICAN: 73 mL/min — AB (ref >90)
Glucose, Bld: 110 mg/dL — ABNORMAL HIGH (ref 70–99)
Potassium: 3.9 mEq/L (ref 3.7–5.3)
Sodium: 139 mEq/L (ref 137–147)

## 2013-11-14 LAB — GLUCOSE, CAPILLARY
GLUCOSE-CAPILLARY: 78 mg/dL (ref 70–99)
GLUCOSE-CAPILLARY: 164 mg/dL — AB (ref 70–99)
Glucose-Capillary: 116 mg/dL — ABNORMAL HIGH (ref 70–99)
Glucose-Capillary: 115 mg/dL — ABNORMAL HIGH (ref 70–99)
Glucose-Capillary: 155 mg/dL — ABNORMAL HIGH (ref 70–99)

## 2013-11-14 LAB — URINALYSIS, ROUTINE W REFLEX MICROSCOPIC
GLUCOSE, UA: 100 mg/dL — AB
Hgb urine dipstick: NEGATIVE
KETONES UR: NEGATIVE mg/dL
Nitrite: NEGATIVE
Protein, ur: NEGATIVE mg/dL
Specific Gravity, Urine: 1.025 (ref 1.005–1.030)
Urobilinogen, UA: 1 mg/dL (ref 0.0–1.0)
pH: 6 (ref 5.0–8.0)

## 2013-11-14 LAB — CBC
HCT: 31.4 % — ABNORMAL LOW (ref 36.0–46.0)
Hemoglobin: 10.1 g/dL — ABNORMAL LOW (ref 12.0–15.0)
MCH: 28 pg (ref 26.0–34.0)
MCHC: 32.2 g/dL (ref 30.0–36.0)
MCV: 87 fL (ref 78.0–100.0)
PLATELETS: 164 10*3/uL (ref 150–400)
RBC: 3.61 MIL/uL — ABNORMAL LOW (ref 3.87–5.11)
RDW: 14.7 % (ref 11.5–15.5)
WBC: 7.7 10*3/uL (ref 4.0–10.5)

## 2013-11-14 LAB — URINE MICROSCOPIC-ADD ON

## 2013-11-14 LAB — HEMOGLOBIN A1C
Hgb A1c MFr Bld: 6 % — ABNORMAL HIGH (ref <5.7)
Mean Plasma Glucose: 126 mg/dL — ABNORMAL HIGH (ref <117)

## 2013-11-14 LAB — URINE CULTURE
Colony Count: NO GROWTH
Culture: NO GROWTH
Special Requests: NORMAL

## 2013-11-14 LAB — TSH: TSH: 0.577 u[IU]/mL (ref 0.350–4.500)

## 2013-11-14 MED ORDER — ACETAMINOPHEN 325 MG PO TABS
650.0000 mg | ORAL_TABLET | Freq: Four times a day (QID) | ORAL | Status: DC | PRN
  Administered 2013-11-14: 650 mg via ORAL
  Filled 2013-11-14: qty 2

## 2013-11-14 MED ORDER — ENSURE COMPLETE PO LIQD
237.0000 mL | Freq: Two times a day (BID) | ORAL | Status: DC
  Administered 2013-11-15: 237 mL via ORAL

## 2013-11-14 MED ORDER — TRAZODONE HCL 100 MG PO TABS
100.0000 mg | ORAL_TABLET | Freq: Every day | ORAL | Status: DC
  Administered 2013-11-14: 100 mg via ORAL
  Filled 2013-11-14 (×2): qty 1

## 2013-11-14 MED ORDER — SODIUM CHLORIDE 0.9 % IV SOLN
INTRAVENOUS | Status: DC
  Administered 2013-11-14 – 2013-11-15 (×2): via INTRAVENOUS

## 2013-11-14 MED ORDER — SIMVASTATIN 20 MG PO TABS
20.0000 mg | ORAL_TABLET | Freq: Every day | ORAL | Status: DC
  Administered 2013-11-14: 20 mg via ORAL
  Filled 2013-11-14 (×2): qty 1

## 2013-11-14 MED ORDER — DIVALPROEX SODIUM ER 500 MG PO TB24
1500.0000 mg | ORAL_TABLET | Freq: Every day | ORAL | Status: DC
  Filled 2013-11-14: qty 3
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
## 2013-11-14 NOTE — Progress Notes (Signed)
Utilization review completed.  

## 2013-11-14 NOTE — Progress Notes (Signed)
INITIAL NUTRITION ASSESSMENT  DOCUMENTATION CODES Per approved criteria  -Not Applicable   INTERVENTION: - Ensure Complete BID - Encouraged increased meal intake - RD to continue to monitor   NUTRITION DIAGNOSIS: Inadequate oral intake related to poor appetite as evidenced by pt report.   Goal: Pt to consume >90% of meals/supplements  Monitor:  Weights, labs, intake  Reason for Assessment: Malnutrition screening tool   53 y.o. female  Admitting Dx: Altered mental status   ASSESSMENT: Pt with bipolar disorder and DM type II, alcohol abuse, brought to the [facility] ED after found laying down unresponsive on the porch outside, neighbor called EMS. Pt with sitter in room.   - Met with pt who reports just eating 1 meal/day at home and was snacking on things like cheetos - Said she has been on protein shakes before including Atkins shakes, interested in getting Ensure Complete during admission  - Didn't eat much of her breakfast   Height: Ht Readings from Last 1 Encounters:  11/13/13 '5\' 4"'  (1.626 m)    Weight: Wt Readings from Last 1 Encounters:  11/13/13 207 lb 14.3 oz (94.3 kg)    Ideal Body Weight: 120 lbs   % Ideal Body Weight: 172%  Wt Readings from Last 10 Encounters:  11/13/13 207 lb 14.3 oz (94.3 kg)  10/06/13 209 lb (94.802 kg)  09/23/13 209 lb (94.802 kg)  08/29/13 209 lb (94.802 kg)  04/28/13 200 lb 9.6 oz (90.992 kg)  11/30/12 188 lb (85.276 kg)  11/11/12 171 lb (77.565 kg)  09/24/12 185 lb (83.915 kg)  09/24/12 195 lb 3.2 oz (88.542 kg)  09/19/12 183 lb (83.008 kg)    Usual Body Weight: 209 lbs   % Usual Body Weight: 99%  BMI:  Body mass index is 35.67 kg/(m^2). Class II obesity   Estimated Nutritional Needs: Kcal: 1550-1750 Protein: 55-70g Fluid: 1.5-1.7L/day   Skin: rash on left face and underneath left eye and top of nose   Diet Order: Full Liquid  EDUCATION NEEDS: -No education needs identified at this time   Intake/Output Summary  (Last 24 hours) at 11/14/13 1118 Last data filed at 11/14/13 1000  Gross per 24 hour  Intake 591.67 ml  Output    650 ml  Net -58.33 ml    Last BM: 8/3   Labs:   Recent Labs Lab 11/13/13 1209 11/14/13 0342  NA 136* 139  K 4.0 3.9  CL 101 105  CO2 25 27  BUN 14 10  CREATININE 1.05 0.89  CALCIUM 8.7 8.8  GLUCOSE 120* 110*    CBG (last 3)   Recent Labs  11/13/13 1206 11/13/13 2144 11/14/13 0742  GLUCAP 114* 116* 78    Scheduled Meds: . divalproex  500 mg Oral Daily  . enoxaparin (LOVENOX) injection  40 mg Subcutaneous Q24H  . folic acid  1 mg Oral Daily  . insulin aspart  0-9 Units Subcutaneous TID WC  . multivitamin with minerals  1 tablet Oral Daily  . simvastatin  20 mg Oral q1800  . thiamine  100 mg Oral Daily  . traZODone  100 mg Oral QHS    Continuous Infusions: . sodium chloride 75 mL/hr at 11/14/13 0900    Past Medical History  Diagnosis Date  . Bipolar 1 disorder   . Obesity   . Diabetes mellitus   . Hypertension   . Vomiting   . Substance abuse     alcohol   . Type II or unspecified type diabetes mellitus  without mention of complication, not stated as uncontrolled   . Depression   . Hyperlipidemia   . Anxiety     Past Surgical History  Procedure Laterality Date  . Tonsillectomy    . Laparoscopic gastric banding  06/11/09  . Vaginal delivery  [place], [place], [place] Pager [phone] Weekend/After Hours Pager
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
## 2013-11-15 LAB — BASIC METABOLIC PANEL
Anion gap: 7 (ref 5–15)
BUN: 10 mg/dL (ref 6–23)
CO2: 29 mEq/L (ref 19–32)
CREATININE: 0.88 mg/dL (ref 0.50–1.10)
Calcium: 8.6 mg/dL (ref 8.4–10.5)
Chloride: 106 mEq/L (ref 96–112)
GFR calc Af Amer: 85 mL/min — ABNORMAL LOW (ref >90)
GFR, EST NON AFRICAN AMERICAN: 74 mL/min — AB (ref >90)
GLUCOSE: 136 mg/dL — AB (ref 70–99)
POTASSIUM: 4.3 meq/L (ref 3.7–5.3)
Sodium: 142 mEq/L (ref 137–147)

## 2013-11-15 LAB — GLUCOSE, CAPILLARY
GLUCOSE-CAPILLARY: 112 mg/dL — AB (ref 70–99)
Glucose-Capillary: 90 mg/dL (ref 70–99)

## 2013-11-15 LAB — CBC
HEMATOCRIT: 32.2 % — AB (ref 36.0–46.0)
HEMOGLOBIN: 10.3 g/dL — AB (ref 12.0–15.0)
MCH: 28.2 pg (ref 26.0–34.0)
MCHC: 32 g/dL (ref 30.0–36.0)
MCV: 88.2 fL (ref 78.0–100.0)
Platelets: 186 10*3/uL (ref 150–400)
RBC: 3.65 MIL/uL — ABNORMAL LOW (ref 3.87–5.11)
RDW: 14.9 % (ref 11.5–15.5)
WBC: 4.5 10*3/uL (ref 4.0–10.5)

## 2013-11-15 NOTE — Progress Notes (Signed)
Clinical Social Work  Per MD, patient is medically stable to DC to inpatient psych today. CSW contacted the following facilities re: placement:   [facility]- [facility] beds. Referral faxed.  [facility]) will review and alert CSW if any beds are available  [facility]- no [facility] beds  [facility]- available beds. Referral faxed.  Patient is being reviewed by 3 hospitals at this time. If these hospitals are unable to accept then CSW will expand search.  [place]olly Arius [place], [place] [phone]

## 2013-11-15 NOTE — Progress Notes (Signed)
Clinical Social Work  Patient accepted to [facility] by Dr. Forrestine HimButtar. RN to call report to [phone]. CSW faxed DC summary and [facility] Sheriff to call nursing unit when they are on their way to pick up patient. CSW informed patient, RN ,and MD of DC plans. Patient upset and reports she has been at [facility] in the past and does not want to return. CSW explained IVC status and that patient would be transferred this afternoon. CSW is signing off but available if needed.  [place], [place] [phone]

## 2013-11-15 NOTE — Discharge Instructions (Signed)
Suicidal Feelings, How to Help Yourself °Everyone feels sad or unhappy at times, but depressing thoughts and feelings of hopelessness can lead to thoughts of suicide. It can seem as if life is too tough to handle. If you feel as though you have reached the point where suicide is the only answer, it is time to let someone know immediately.  °HOW TO COPE AND PREVENT SUICIDE °· Let family, friends, teachers, or counselors know. Get help. Try not to isolate yourself from those who care about you. Even though you may not feel sociable, talk with someone every day. It is best if it is face-to-face. Remember, they will want to help you. °· Eat a regularly spaced and well-balanced diet. °· Get plenty of rest. °· Avoid alcohol and drugs because they will only make you feel worse and may also lower your inhibitions. Remove them from the home. If you are thinking of taking an overdose of your prescribed medicines, give your medicines to someone who can give them to you one day at a time. If you are on antidepressants, let your caregiver know of your feelings so he or she can provide a safer medicine, if that is a concern. °· Remove weapons or poisons from your home. °· Try to stick to routines. Follow a schedule and remind yourself that you have to keep that schedule every day. °· Set some realistic goals and achieve them. Make a list and cross things off as you go. Accomplishments give a sense of worth. Wait until you are feeling better before doing things you find difficult or unpleasant to do. °· If you are able, try to start exercising. Even half-hour periods of exercise each day will make you feel better. Getting out in the sun or into nature helps you recover from depression faster. If you have a favorite place to walk, take advantage of that. °· Increase safe activities that have always given you pleasure. This may include playing your favorite music, reading a good book, painting a picture, or playing your favorite  instrument. Do whatever takes your mind off your depression. °· Keep your living space well-lighted. °GET HELP °Contact a suicide hotline, crisis center, or local suicide prevention center for help right away. Local centers may include a hospital, clinic, community service organization, social service provider, or health department. °· Call your local emergency services (911 in the [place]). °· Call a suicide hotline: °¨ 1-800-273-TALK (1-800-273-8255) in the [place]. °¨ 1-800-SUICIDE (1-800-784-2433) in the [place]. °¨ 1-888-628-9454 in the [place] for Spanish-speaking counselors. °¨ 1-800-799-4TTY (1-800-799-4889) in the [place] for TTY users. °· Visit the following websites for information and help: °¨ National Suicide Prevention Lifeline: www.suicidepreventionlifeline.org °¨ Hopeline: www.hopeline.com °¨ American Foundation for Suicide Prevention: www.afsp.org °· For lesbian, gay, bisexual, transgender, or questioning youth, contact The [employer]: °¨ 1-866-4-U-TREVOR (1-866-488-7386) in the [place]. °¨ www.thetrevorproject.org °· In [place], treatment resources are listed in each province with listings available under [employer] or similar titles. Another source for Crisis Centres by Province is located at http://www.suicideprevention.ca/in-crisis-now/find-a-crisis-centre-now/crisis-centres °Document Released: 10/04/2002 Document Revised: 06/22/2011 Document Reviewed: 07/25/2013 °ExitCare® Patient Information ©2015 [employer]. This information is not intended to replace advice given to you by your health care provider. Make sure you discuss any questions you have with your health care provider. ° °
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
## 2013-11-15 NOTE — Progress Notes (Signed)
[facility]

## 2013-11-19 LAB — CULTURE, BLOOD (ROUTINE X 2)
CULTURE: NO GROWTH
Culture: NO GROWTH

## 2013-11-21 ENCOUNTER — Inpatient Hospital Stay (HOSPITAL_COMMUNITY)
Admission: EM | Admit: 2013-11-21 | Discharge: 2013-11-27 | DRG: 917 | Disposition: A | Discharge status: Home / Self Care | Payer: Medicare Other | Attending: Internal Medicine | Admitting: Internal Medicine

## 2013-11-21 ENCOUNTER — Encounter (HOSPITAL_COMMUNITY): Payer: Self-pay | Admitting: Emergency Medicine

## 2013-11-21 DIAGNOSIS — Z888 Allergy status to other drugs, medicaments and biological substances status: Secondary | ICD-10-CM

## 2013-11-21 DIAGNOSIS — E669 Obesity, unspecified: Secondary | ICD-10-CM | POA: Diagnosis present

## 2013-11-21 DIAGNOSIS — E785 Hyperlipidemia, unspecified: Secondary | ICD-10-CM | POA: Diagnosis present

## 2013-11-21 DIAGNOSIS — Z79899 Other long term (current) drug therapy: Secondary | ICD-10-CM

## 2013-11-21 DIAGNOSIS — E0821 Diabetes mellitus due to underlying condition with diabetic nephropathy: Secondary | ICD-10-CM

## 2013-11-21 DIAGNOSIS — G8929 Other chronic pain: Secondary | ICD-10-CM | POA: Diagnosis present

## 2013-11-21 DIAGNOSIS — G4700 Insomnia, unspecified: Secondary | ICD-10-CM | POA: Diagnosis present

## 2013-11-21 DIAGNOSIS — F1010 Alcohol abuse, uncomplicated: Secondary | ICD-10-CM | POA: Diagnosis present

## 2013-11-21 DIAGNOSIS — Z6379 Other stressful life events affecting family and household: Secondary | ICD-10-CM | POA: Diagnosis not present

## 2013-11-21 DIAGNOSIS — F1020 Alcohol dependence, uncomplicated: Secondary | ICD-10-CM

## 2013-11-21 DIAGNOSIS — F319 Bipolar disorder, unspecified: Secondary | ICD-10-CM | POA: Diagnosis present

## 2013-11-21 DIAGNOSIS — D638 Anemia in other chronic diseases classified elsewhere: Secondary | ICD-10-CM | POA: Diagnosis present

## 2013-11-21 DIAGNOSIS — N183 Chronic kidney disease, stage 3 unspecified: Secondary | ICD-10-CM | POA: Diagnosis present

## 2013-11-21 DIAGNOSIS — Y92009 Unspecified place in unspecified non-institutional (private) residence as the place of occurrence of the external cause: Secondary | ICD-10-CM | POA: Diagnosis not present

## 2013-11-21 DIAGNOSIS — I129 Hypertensive chronic kidney disease with stage 1 through stage 4 chronic kidney disease, or unspecified chronic kidney disease: Secondary | ICD-10-CM | POA: Diagnosis present

## 2013-11-21 DIAGNOSIS — M549 Dorsalgia, unspecified: Secondary | ICD-10-CM | POA: Diagnosis present

## 2013-11-21 DIAGNOSIS — Z87891 Personal history of nicotine dependence: Secondary | ICD-10-CM | POA: Diagnosis not present

## 2013-11-21 DIAGNOSIS — F1210 Cannabis abuse, uncomplicated: Secondary | ICD-10-CM | POA: Diagnosis present

## 2013-11-21 DIAGNOSIS — T424X4A Poisoning by benzodiazepines, undetermined, initial encounter: Secondary | ICD-10-CM | POA: Diagnosis present

## 2013-11-21 DIAGNOSIS — F419 Anxiety disorder, unspecified: Secondary | ICD-10-CM

## 2013-11-21 DIAGNOSIS — F10939 Alcohol use, unspecified with withdrawal, unspecified: Secondary | ICD-10-CM

## 2013-11-21 DIAGNOSIS — Z9884 Bariatric surgery status: Secondary | ICD-10-CM

## 2013-11-21 DIAGNOSIS — G929 Unspecified toxic encephalopathy: Secondary | ICD-10-CM | POA: Diagnosis present

## 2013-11-21 DIAGNOSIS — F3132 Bipolar disorder, current episode depressed, moderate: Secondary | ICD-10-CM

## 2013-11-21 DIAGNOSIS — Z8261 Family history of arthritis: Secondary | ICD-10-CM | POA: Diagnosis not present

## 2013-11-21 DIAGNOSIS — F10239 Alcohol dependence with withdrawal, unspecified: Secondary | ICD-10-CM

## 2013-11-21 DIAGNOSIS — F1994 Other psychoactive substance use, unspecified with psychoactive substance-induced mood disorder: Secondary | ICD-10-CM | POA: Diagnosis present

## 2013-11-21 DIAGNOSIS — E876 Hypokalemia: Secondary | ICD-10-CM | POA: Diagnosis present

## 2013-11-21 DIAGNOSIS — T438X2A Poisoning by other psychotropic drugs, intentional self-harm, initial encounter: Secondary | ICD-10-CM | POA: Diagnosis present

## 2013-11-21 DIAGNOSIS — R4182 Altered mental status, unspecified: Secondary | ICD-10-CM | POA: Diagnosis present

## 2013-11-21 DIAGNOSIS — F10230 Alcohol dependence with withdrawal, uncomplicated: Secondary | ICD-10-CM

## 2013-11-21 DIAGNOSIS — F1110 Opioid abuse, uncomplicated: Secondary | ICD-10-CM | POA: Diagnosis present

## 2013-11-21 DIAGNOSIS — Z6837 Body mass index (BMI) 37.0-37.9, adult: Secondary | ICD-10-CM

## 2013-11-21 DIAGNOSIS — E119 Type 2 diabetes mellitus without complications: Secondary | ICD-10-CM | POA: Diagnosis present

## 2013-11-21 DIAGNOSIS — F411 Generalized anxiety disorder: Secondary | ICD-10-CM | POA: Diagnosis present

## 2013-11-21 DIAGNOSIS — E1140 Type 2 diabetes mellitus with diabetic neuropathy, unspecified: Secondary | ICD-10-CM

## 2013-11-21 DIAGNOSIS — T43502A Poisoning by unspecified antipsychotics and neuroleptics, intentional self-harm, initial encounter: Secondary | ICD-10-CM | POA: Diagnosis present

## 2013-11-21 DIAGNOSIS — M545 Low back pain, unspecified: Secondary | ICD-10-CM

## 2013-11-21 DIAGNOSIS — T50902S Poisoning by unspecified drugs, medicaments and biological substances, intentional self-harm, sequela: Secondary | ICD-10-CM

## 2013-11-21 DIAGNOSIS — N058 Unspecified nephritic syndrome with other morphologic changes: Secondary | ICD-10-CM

## 2013-11-21 DIAGNOSIS — E1329 Other specified diabetes mellitus with other diabetic kidney complication: Secondary | ICD-10-CM

## 2013-11-21 DIAGNOSIS — G92 Toxic encephalopathy: Secondary | ICD-10-CM | POA: Diagnosis present

## 2013-11-21 DIAGNOSIS — F1410 Cocaine abuse, uncomplicated: Secondary | ICD-10-CM | POA: Diagnosis present

## 2013-11-21 DIAGNOSIS — F314 Bipolar disorder, current episode depressed, severe, without psychotic features: Secondary | ICD-10-CM

## 2013-11-21 DIAGNOSIS — B369 Superficial mycosis, unspecified: Secondary | ICD-10-CM | POA: Diagnosis present

## 2013-11-21 DIAGNOSIS — I10 Essential (primary) hypertension: Secondary | ICD-10-CM

## 2013-11-21 DIAGNOSIS — Z8249 Family history of ischemic heart disease and other diseases of the circulatory system: Secondary | ICD-10-CM

## 2013-11-21 DIAGNOSIS — F1910 Other psychoactive substance abuse, uncomplicated: Secondary | ICD-10-CM

## 2013-11-21 LAB — COMPREHENSIVE METABOLIC PANEL
ALT: 23 U/L (ref 0–35)
AST: 29 U/L (ref 0–37)
Albumin: 3.1 g/dL — ABNORMAL LOW (ref 3.5–5.2)
Alkaline Phosphatase: 70 U/L (ref 39–117)
Anion gap: 11 (ref 5–15)
BILIRUBIN TOTAL: 0.4 mg/dL (ref 0.3–1.2)
BUN: 20 mg/dL (ref 6–23)
CALCIUM: 8.9 mg/dL (ref 8.4–10.5)
CHLORIDE: 104 meq/L (ref 96–112)
CO2: 26 meq/L (ref 19–32)
Creatinine, Ser: 1.13 mg/dL — ABNORMAL HIGH (ref 0.50–1.10)
GFR, EST AFRICAN AMERICAN: 63 mL/min — AB (ref >90)
GFR, EST NON AFRICAN AMERICAN: 54 mL/min — AB (ref >90)
GLUCOSE: 148 mg/dL — AB (ref 70–99)
Potassium: 4.1 mEq/L (ref 3.7–5.3)
Sodium: 141 mEq/L (ref 137–147)
Total Protein: 6.2 g/dL (ref 6.0–8.3)

## 2013-11-21 LAB — RAPID URINE DRUG SCREEN, HOSP PERFORMED
AMPHETAMINES: NOT DETECTED
BARBITURATES: NOT DETECTED
Benzodiazepines: POSITIVE — AB
Cocaine: NOT DETECTED
Opiates: NOT DETECTED
TETRAHYDROCANNABINOL: NOT DETECTED

## 2013-11-21 LAB — BLOOD GAS, ARTERIAL
Acid-Base Excess: 1.5 mmol/L (ref 0.0–2.0)
Bicarbonate: 25.7 mEq/L — ABNORMAL HIGH (ref 20.0–24.0)
Drawn by: 295031
O2 Content: 2 L/min
O2 SAT: 94.6 %
PATIENT TEMPERATURE: 98.6
PH ART: 7.413 (ref 7.350–7.450)
TCO2: 23.9 mmol/L (ref 0–100)
pCO2 arterial: 41 mmHg (ref 35.0–45.0)
pO2, Arterial: 72.9 mmHg — ABNORMAL LOW (ref 80.0–100.0)

## 2013-11-21 LAB — ACETAMINOPHEN LEVEL: Acetaminophen (Tylenol), Serum: <15 ug/mL (ref 10–30)

## 2013-11-21 LAB — CBC
HCT: 30.3 % — ABNORMAL LOW (ref 36.0–46.0)
HEMOGLOBIN: 10.2 g/dL — AB (ref 12.0–15.0)
MCH: 28.3 pg (ref 26.0–34.0)
MCHC: 33.7 g/dL (ref 30.0–36.0)
MCV: 83.9 fL (ref 78.0–100.0)
Platelets: 343 10*3/uL (ref 150–400)
RBC: 3.61 MIL/uL — ABNORMAL LOW (ref 3.87–5.11)
RDW: 15.2 % (ref 11.5–15.5)
WBC: 5.9 10*3/uL (ref 4.0–10.5)

## 2013-11-21 LAB — I-STAT CG4 LACTIC ACID, ED: Lactic Acid, Venous: 1.48 mmol/L (ref 0.5–2.2)

## 2013-11-21 LAB — CBG MONITORING, ED: Glucose-Capillary: 134 mg/dL — ABNORMAL HIGH (ref 70–99)

## 2013-11-21 LAB — SALICYLATE LEVEL: Salicylate Lvl: <2 mg/dL — ABNORMAL LOW (ref 2.8–20.0)

## 2013-11-21 LAB — GLUCOSE, CAPILLARY: GLUCOSE-CAPILLARY: 163 mg/dL — AB (ref 70–99)

## 2013-11-21 LAB — ETHANOL

## 2013-11-21 LAB — VALPROIC ACID LEVEL: VALPROIC ACID LVL: 103.3 ug/mL — AB (ref 50.0–100.0)

## 2013-11-21 MED ORDER — METFORMIN HCL 500 MG PO TABS
500.0000 mg | ORAL_TABLET | Freq: Two times a day (BID) | ORAL | Status: DC
  Administered 2013-11-21: 500 mg via ORAL
  Filled 2013-11-21 (×4): qty 1

## 2013-11-21 MED ORDER — METOPROLOL TARTRATE 25 MG PO TABS
25.0000 mg | ORAL_TABLET | Freq: Two times a day (BID) | ORAL | Status: DC
  Administered 2013-11-21 – 2013-11-27 (×11): 25 mg via ORAL
  Filled 2013-11-21 (×13): qty 1

## 2013-11-21 MED ORDER — DIVALPROEX SODIUM ER 500 MG PO TB24
1000.0000 mg | ORAL_TABLET | Freq: Every day | ORAL | Status: DC
  Filled 2013-11-21: qty 2

## 2013-11-21 MED ORDER — ACETAMINOPHEN 325 MG PO TABS
650.0000 mg | ORAL_TABLET | Freq: Four times a day (QID) | ORAL | Status: DC | PRN
  Administered 2013-11-22 – 2013-11-26 (×6): 650 mg via ORAL
  Filled 2013-11-21 (×6): qty 2

## 2013-11-21 MED ORDER — AMLODIPINE BESYLATE 5 MG PO TABS
5.0000 mg | ORAL_TABLET | Freq: Every morning | ORAL | Status: DC
  Administered 2013-11-22: 5 mg via ORAL
  Filled 2013-11-21: qty 1

## 2013-11-21 MED ORDER — CETYLPYRIDINIUM CHLORIDE 0.05 % MT LIQD
7.0000 mL | Freq: Two times a day (BID) | OROMUCOSAL | Status: DC
  Administered 2013-11-23 – 2013-11-27 (×8): 7 mL via OROMUCOSAL

## 2013-11-21 MED ORDER — ONDANSETRON HCL 4 MG/2ML IJ SOLN: 4.0000 mg | Freq: Four times a day (QID) | INTRAMUSCULAR | Status: DC | PRN

## 2013-11-21 MED ORDER — SODIUM CHLORIDE 0.9 % IV SOLN
INTRAVENOUS | Status: DC
  Administered 2013-11-21 – 2013-11-22 (×2): via INTRAVENOUS

## 2013-11-21 MED ORDER — SIMVASTATIN 10 MG PO TABS
10.0000 mg | ORAL_TABLET | Freq: Every day | ORAL | Status: DC
  Administered 2013-11-21: 10 mg via ORAL
  Filled 2013-11-21 (×2): qty 1

## 2013-11-21 MED ORDER — ACETAMINOPHEN 650 MG RE SUPP: 650.0000 mg | Freq: Four times a day (QID) | RECTAL | Status: DC | PRN

## 2013-11-21 MED ORDER — ONDANSETRON HCL 4 MG PO TABS
4.0000 mg | ORAL_TABLET | Freq: Four times a day (QID) | ORAL | Status: DC | PRN
  Administered 2013-11-24: 4 mg via ORAL
  Filled 2013-11-21: qty 1

## 2013-11-21 MED ORDER — GABAPENTIN 300 MG PO CAPS
300.0000 mg | ORAL_CAPSULE | Freq: Three times a day (TID) | ORAL | Status: DC
  Administered 2013-11-21: 300 mg via ORAL
  Filled 2013-11-21 (×5): qty 1

## 2013-11-21 MED ORDER — DIVALPROEX SODIUM ER 500 MG PO TB24: 500.0000 mg | ORAL_TABLET | Freq: Every day | ORAL | Status: DC

## 2013-11-21 MED ORDER — SODIUM CHLORIDE 0.9 % IJ SOLN
3.0000 mL | Freq: Two times a day (BID) | INTRAMUSCULAR | Status: DC
  Administered 2013-11-23 – 2013-11-27 (×8): 3 mL via INTRAVENOUS

## 2013-11-21 MED ORDER — TRAZODONE HCL 100 MG PO TABS
100.0000 mg | ORAL_TABLET | Freq: Every day | ORAL | Status: DC
  Administered 2013-11-21: 100 mg via ORAL
  Filled 2013-11-21: qty 2
  Filled 2013-11-21 (×2): qty 1

## 2013-11-21 NOTE — H&P (Addendum)
[facility] History and Physical  Lynn Palmer [id number] DOB: 1960-12-18 DOA: 11/21/2013  Referring physician: ER physician PCP: Nadean CorwinMCKEOWN,WILLIAM DAVID, MD   Chief Complaint: altered mental status  HPI:   Pt is 53 yo female with bipolar disorder and DM type II, alcohol abuse, recently in hospital for suicidal attempt [facility] with altered ental status and decreased level of consciousness. Pt apparently took unknown amount of Klonopin but has denied suicidal ideations. In addition, her CBG was low 46. Pt still somewhat altered and not a good historian. No complaints of chest pain, shortness of breath or palpitations. No abdominal pain, nausea or vomiting. In ED, vitals are stable. Blood work only showed mild anemia with hemoglobin of 10.2 and mild rise in creatinine 1.13. Pt was altered in ED and [facility] asked to admit for further evaluation.   Assessment & Plan    Active Problems:  Altered mental status  - Likely due to Klonopin or valproic acid. Her valproic acid is high on this admission so valproate needs to be held until levels normalize.  - Patient denies suicidal ideations. - ethanol WNL - Avoid use of benzos unless pt noted to have seizures or withdrawal  DM type II  - continue Metformin  Polysubstance abuse  - including alcohol, THC, benzo's  - avoid all sedating medications  CKD, stage 3 - creatinine 1.13 on admission and about 1 month ago 1.2 - may continue IV fluids - follow up BMP in am Hypertension  - continue Norvasc 5 mg daily  Bipolar disorder  - hold valproic acid until level normalizes. Anemia of chronic disease  - no signs of active bleeding   SCD for DVT prophylaxis while inpatient   Radiological Exams on Admission: No results found.   Code Status: Full Family Communication: Family not at the bedside Disposition Plan: Admit for further evaluation  Manson PasseyEVINE, Odette Watanabe, MD  [facility] Pager [phone]  Review of  Systems:  Altered mental status   Past Medical History  Diagnosis Date  . Bipolar 1 disorder   . Obesity   . Diabetes mellitus   . Hypertension   . Vomiting   . Substance abuse     alcohol   . Type II or unspecified type diabetes mellitus without mention of complication, not stated as uncontrolled   . Depression   . Hyperlipidemia   . Anxiety    Past Surgical History  Procedure Laterality Date  . Tonsillectomy    . Laparoscopic gastric banding  06/11/09  . Vaginal delivery  1987   Social History:  reports that she has quit smoking. Her smoking use included Cigarettes. She smoked 0.00 packs per day. She has never used smokeless tobacco. She reports that she drinks alcohol. She reports that she uses illicit drugs (Marijuana, Cocaine, and Heroin).  Allergies  Allergen Reactions  . Lithium     Unknown reaction   . Risperidone Other (See Comments)    [facility]  . Ritalin [Methylphenidate Hcl]     Unknown reaction   . Valproic Acid And Related Other (See Comments)    Re[facility]. Tolerates divalproex(Depakote).     Family History:  Family History  Problem Relation Age of Onset  . Osteoarthritis Mother   . Heart failure Father   . Osteoarthritis Father   . Alcohol abuse Father      Prior to Admission medications   Medication Sig Start Date End Date  Taking? Authorizing Provider  amLODipine (NORVASC) 5 MG tablet Take 1 tablet (5 mg total) by mouth every morning. For high blood pressure 10/12/13  Yes Sanjuana Kava, NP  divalproex (DEPAKOTE ER) 500 MG 24 hr tablet Take 500-1,000 mg by mouth 2 (two) times daily. Takes 500mg  in the morning and 1000mg  at bedtime   Yes Historical Provider, MD  gabapentin (NEURONTIN) 300 MG capsule Take 300 mg by mouth 3 (three) times daily.   Yes Historical Provider, MD  metFORMIN (GLUCOPHAGE) 500 MG tablet Take 1 tablet (500 mg total) by mouth 2 (two) times daily with a meal.  For diabetes 10/12/13  Yes Sanjuana Kava, NP  metoprolol tartrate (LOPRESSOR) 25 MG tablet Take 1 tablet (25 mg total) by mouth 2 (two) times daily. For hypertension 10/12/13  Yes Sanjuana Kava, NP  pravastatin (PRAVACHOL) 20 MG tablet Take 1 tablet (20 mg total) by mouth every evening. For high cholesterol 10/12/13  Yes Sanjuana Kava, NP  traZODone (DESYREL) 100 MG tablet Take 100 mg by mouth at bedtime.   Yes Historical Provider, MD   Physical Exam: Filed Vitals:   11/21/13 1600 11/21/13 1615 11/21/13 1659 11/21/13 1759  BP: 103/55 118/63 101/79 123/67  Pulse: 70 71 67 72  Temp:    98 F (36.7 C)  TempSrc:    Oral  Resp: 17 17 19 18   Height:    5\' 3"  (1.6 m)  Weight:    94.802 kg (209 lb)  SpO2: 98% 97% 99% 100%    Physical Exam  Constitutional: Appears well-developed and well-nourished. No distress.  HENT: Normocephalic. No tonsillar erythema or exudates Eyes: Conjunctivae and EOM are normal. PERRLA, no scleral icterus.  Neck: Normal ROM. Neck supple. No JVD. No tracheal deviation.  CVS: RRR, S1/S2 +, no murmurs, no gallops, no carotid bruit.  Pulmonary: Effort and breath sounds normal, no stridor, rhonchi, wheezes, rales.  Abdominal: Soft. BS +,  no distension, tenderness, rebound or guarding.  Musculoskeletal: Normal range of motion. No edema and no tenderness.  Lymphadenopathy: No lymphadenopathy noted, cervical, inguinal. Neuro: Normal reflexes, muscle tone coordination. No focal neurologic deficits. Skin: Skin is warm and dry. No rash noted. Not diaphoretic.    Labs on Admission:  Basic Metabolic Panel:  Recent Labs Lab 11/15/13 0520 11/21/13 1555  NA 142 141  K 4.3 4.1  CL 106 104  CO2 29 26  GLUCOSE 136* 148*  BUN 10 20  CREATININE 0.88 1.13*  CALCIUM 8.6 8.9   Liver Function Tests:  Recent Labs Lab 11/21/13 1555  AST 29  ALT 23  ALKPHOS 70  BILITOT 0.4  PROT 6.2  ALBUMIN 3.1*   No results found for this basename: LIPASE, AMYLASE,  in the last 168  hours No results found for this basename: AMMONIA,  in the last 168 hours CBC:  Recent Labs Lab 11/15/13 0520 11/21/13 1424  WBC 4.5 5.9  HGB 10.3* 10.2*  HCT 32.2* 30.3*  MCV 88.2 83.9  PLT 186 343   Cardiac Enzymes: No results found for this basename: CKTOTAL, CKMB, CKMBINDEX, TROPONINI,  in the last 168 hours BNP: No components found with this basename: POCBNP,  CBG:  Recent Labs Lab 11/14/13 2156 11/15/13 0708 11/15/13 1118 11/21/13 1404  GLUCAP 155* 90 112* 134*    If 7PM-7AM, please contact night-coverage [email or web address] Password [facility] 11/21/2013, 6:25 PM

## 2013-11-21 NOTE — ED Provider Notes (Signed)
CSN: [id number]     Arrival date & time 11/21/13  1353 History   First MD Initiated Contact with Patient 11/21/13 1422     Chief Complaint  Patient presents with  . Drug Overdose     (Consider location/radiation/quality/duration/timing/severity/associated sxs/prior Treatment) HPI Comments: Patient here after being found on the porch with decreased level of consciousness. She is currently in a halfway house and according to old records was discharged 6 days ago after a similar presentation of polysubstance abuse with overdose. At that time she took Klonopin and today it was reported that patient took an unknown amount of Klonopin. EMS was called and patient CBG was 46 and she was given oral glucose and Narcan with some response. Patient will not admit that this was a suicide attempt. No further history obtainable  Patient is a 53 y.o. female presenting with Overdose. The history is provided by the patient. The history is limited by the condition of the patient.  Drug Overdose    Past Medical History  Diagnosis Date  . Bipolar 1 disorder   . Obesity   . Diabetes mellitus   . Hypertension   . Vomiting   . Substance abuse     alcohol   . Type II or unspecified type diabetes mellitus without mention of complication, not stated as uncontrolled   . Depression   . Hyperlipidemia   . Anxiety    Past Surgical History  Procedure Laterality Date  . Tonsillectomy    . Laparoscopic gastric banding  06/11/09  . Vaginal delivery  1987   Family History  Problem Relation Age of Onset  . Osteoarthritis Mother   . Heart failure Father   . Osteoarthritis Father   . Alcohol abuse Father    History  Substance Use Topics  . Smoking status: Never Smoker   . Smokeless tobacco: Never Used  . Alcohol Use: No     Comment: denies present use   OB History   Grav Para Term Preterm Abortions TAB SAB Ect Mult Living                 Review of Systems  Unable to perform ROS     Allergies   Risperidone and Valproic acid and related  Home Medications   Prior to Admission medications   Medication Sig Start Date End Date Taking? Authorizing Provider  amLODipine (NORVASC) 5 MG tablet Take 1 tablet (5 mg total) by mouth every morning. For high blood pressure 10/12/13   Sanjuana KavaAgnes I Nwoko, NP  divalproex (DEPAKOTE ER) 500 MG 24 hr tablet Take 1,500 mg by mouth at bedtime.    Historical Provider, MD  metFORMIN (GLUCOPHAGE) 500 MG tablet Take 1 tablet (500 mg total) by mouth 2 (two) times daily with a meal. For diabetes 10/12/13   Sanjuana KavaAgnes I Nwoko, NP  metoprolol tartrate (LOPRESSOR) 25 MG tablet Take 1 tablet (25 mg total) by mouth 2 (two) times daily. For hypertension 10/12/13   Sanjuana KavaAgnes I Nwoko, NP  pravastatin (PRAVACHOL) 20 MG tablet Take 1 tablet (20 mg total) by mouth every evening. For high cholesterol 10/12/13   Sanjuana KavaAgnes I Nwoko, NP   BP 107/86  Pulse 76  Temp(Src) 98.1 F (36.7 C) (Oral)  Resp 18  SpO2 93%  LMP 11/21/2012 Physical Exam  Nursing note and vitals reviewed. Constitutional: She appears well-developed and well-nourished. She appears lethargic.  Non-toxic appearance. She has a sickly appearance. She appears ill. No distress.  HENT:  Head: Normocephalic and atraumatic.  Eyes: Conjunctivae, EOM and lids are normal. Pupils are equal, round, and reactive to light.  Neck: Normal range of motion. Neck supple. No tracheal deviation present. No mass present.  Cardiovascular: Normal rate, regular rhythm and normal heart sounds.  Exam reveals no gallop.   No murmur heard. Pulmonary/Chest: Effort normal and breath sounds normal. No stridor. No respiratory distress. She has no decreased breath sounds. She has no wheezes. She has no rhonchi. She has no rales.  Abdominal: Soft. Normal appearance and bowel sounds are normal. She exhibits no distension. There is no tenderness. There is no rebound and no CVA tenderness.  Musculoskeletal: Normal range of motion. She exhibits no edema and no  tenderness.  Neurological: She appears lethargic. No cranial nerve deficit or sensory deficit. GCS eye subscore is 3. GCS verbal subscore is 4. GCS motor subscore is 6.  Skin: Skin is warm and dry. No abrasion and no rash noted.  Psychiatric: Her affect is blunt. Her speech is delayed. She is slowed.    ED Course  Procedures (including critical care time) Labs Review Labs Reviewed  CBG MONITORING, ED - Abnormal; Notable for the following:    Glucose-Capillary 134 (*)    All other components within normal limits  CBC  COMPREHENSIVE METABOLIC PANEL  ETHANOL  ACETAMINOPHEN LEVEL  SALICYLATE LEVEL  URINE RAPID DRUG SCREEN (HOSP PERFORMED)  VALPROIC ACID LEVEL  BLOOD GAS, ARTERIAL  I-STAT CG4 LACTIC ACID, ED    Imaging Review No results found.   EKG Interpretation None      MDM   Final diagnoses:  None    Patient reassessed multiple times and is able to maintain her airway. Blood gases reassuring. Patient continues to deny this was a suicide attempt. Will consult hospitalist for admission    Toy Baker, MD 11/21/13 1551

## 2013-11-21 NOTE — ED Notes (Signed)
Chaplain to see pt in [facility].  Familiar with pt from previous admissions to [facility].    Will continue to follow for support during admission.   Alexarae Oliva [name] MDiv  
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
## 2013-11-21 NOTE — ED Notes (Signed)
RT at bedside for arterial blood gas.
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
## 2013-11-21 NOTE — ED Notes (Signed)
MD at bedside. Dr. Allen at bedside.  

## 2013-11-21 NOTE — ED Notes (Signed)
Lab draw recollect drawn and sent to lab.

## 2013-11-21 NOTE — ED Notes (Signed)
Patient is from halfway house with EMS. Upon arrival patient was lying on porch responding to verbal commands. Group home staff think patient took unknown amount of Klonopin. Patient CBG was 46 was given oral glucose, drank mt dew and two doughnut holes at that time cbg came up to 146. Patient was also given 1 mg narcan on site, which did not touch her.

## 2013-11-21 NOTE — Progress Notes (Deleted)
Chaplain to see pt in [facility].  Familiar with pt from previous admissions [facility].    Will continue to follow for support during admission.   [name], Maxie Debose [name] MDiv

## 2013-11-22 DIAGNOSIS — E119 Type 2 diabetes mellitus without complications: Secondary | ICD-10-CM

## 2013-11-22 DIAGNOSIS — F1910 Other psychoactive substance abuse, uncomplicated: Secondary | ICD-10-CM

## 2013-11-22 DIAGNOSIS — R45851 Suicidal ideations: Secondary | ICD-10-CM

## 2013-11-22 DIAGNOSIS — X838XXS Intentional self-harm by other specified means, sequela: Secondary | ICD-10-CM

## 2013-11-22 DIAGNOSIS — F1920 Other psychoactive substance dependence, uncomplicated: Secondary | ICD-10-CM

## 2013-11-22 DIAGNOSIS — T50901S Poisoning by unspecified drugs, medicaments and biological substances, accidental (unintentional), sequela: Secondary | ICD-10-CM

## 2013-11-22 LAB — COMPREHENSIVE METABOLIC PANEL
ALK PHOS: 79 U/L (ref 39–117)
ALT: 22 U/L (ref 0–35)
AST: 28 U/L (ref 0–37)
Albumin: 3.2 g/dL — ABNORMAL LOW (ref 3.5–5.2)
Anion gap: 11 (ref 5–15)
BUN: 11 mg/dL (ref 6–23)
CO2: 26 meq/L (ref 19–32)
Calcium: 9.3 mg/dL (ref 8.4–10.5)
Chloride: 103 mEq/L (ref 96–112)
Creatinine, Ser: 0.99 mg/dL (ref 0.50–1.10)
GFR calc non Af Amer: 64 mL/min — ABNORMAL LOW (ref >90)
GFR, EST AFRICAN AMERICAN: 74 mL/min — AB (ref >90)
Glucose, Bld: 125 mg/dL — ABNORMAL HIGH (ref 70–99)
POTASSIUM: 3.6 meq/L — AB (ref 3.7–5.3)
SODIUM: 140 meq/L (ref 137–147)
TOTAL PROTEIN: 6.7 g/dL (ref 6.0–8.3)
Total Bilirubin: 0.7 mg/dL (ref 0.3–1.2)

## 2013-11-22 LAB — GLUCOSE, CAPILLARY
GLUCOSE-CAPILLARY: 115 mg/dL — AB (ref 70–99)
Glucose-Capillary: 115 mg/dL — ABNORMAL HIGH (ref 70–99)

## 2013-11-22 LAB — CBC
HCT: 33.5 % — ABNORMAL LOW (ref 36.0–46.0)
HEMOGLOBIN: 11 g/dL — AB (ref 12.0–15.0)
MCH: 27.6 pg (ref 26.0–34.0)
MCHC: 32.8 g/dL (ref 30.0–36.0)
MCV: 84 fL (ref 78.0–100.0)
Platelets: 289 10*3/uL (ref 150–400)
RBC: 3.99 MIL/uL (ref 3.87–5.11)
RDW: 14.9 % (ref 11.5–15.5)
WBC: 7.7 10*3/uL (ref 4.0–10.5)

## 2013-11-22 LAB — VALPROIC ACID LEVEL: Valproic Acid Lvl: 68.4 ug/mL (ref 50.0–100.0)

## 2013-11-22 MED ORDER — CHLORDIAZEPOXIDE HCL 25 MG PO CAPS: 25.0000 mg | ORAL_CAPSULE | Freq: Every day | ORAL | Status: DC

## 2013-11-22 MED ORDER — THIAMINE HCL 100 MG/ML IJ SOLN
100.0000 mg | Freq: Once | INTRAMUSCULAR | Status: DC
  Filled 2013-11-22: qty 1

## 2013-11-22 MED ORDER — CHLORDIAZEPOXIDE HCL 25 MG PO CAPS: 25.0000 mg | ORAL_CAPSULE | Freq: Once | ORAL | Status: DC

## 2013-11-22 MED ORDER — ONDANSETRON 4 MG PO TBDP
4.0000 mg | ORAL_TABLET | Freq: Four times a day (QID) | ORAL | Status: AC | PRN
  Filled 2013-11-22: qty 1

## 2013-11-22 MED ORDER — CHLORDIAZEPOXIDE HCL 25 MG PO CAPS
25.0000 mg | ORAL_CAPSULE | Freq: Three times a day (TID) | ORAL | Status: AC
  Administered 2013-11-23 (×3): 25 mg via ORAL
  Filled 2013-11-22 (×3): qty 1

## 2013-11-22 MED ORDER — CHLORDIAZEPOXIDE HCL 25 MG PO CAPS
25.0000 mg | ORAL_CAPSULE | Freq: Four times a day (QID) | ORAL | Status: AC
  Administered 2013-11-22 (×2): 25 mg via ORAL
  Filled 2013-11-22 (×2): qty 1

## 2013-11-22 MED ORDER — VITAMIN B-1 100 MG PO TABS
100.0000 mg | ORAL_TABLET | Freq: Every day | ORAL | Status: DC
Start: 2013-11-23 — End: 2013-11-27
  Administered 2013-11-23 – 2013-11-27 (×5): 100 mg via ORAL
  Filled 2013-11-22 (×5): qty 1

## 2013-11-22 MED ORDER — CHLORDIAZEPOXIDE HCL 25 MG PO CAPS
25.0000 mg | ORAL_CAPSULE | ORAL | Status: DC
  Administered 2013-11-24: 25 mg via ORAL
  Filled 2013-11-22: qty 1

## 2013-11-22 MED ORDER — INSULIN ASPART 100 UNIT/ML ~~LOC~~ SOLN
0.0000 [IU] | Freq: Three times a day (TID) | SUBCUTANEOUS | Status: DC
  Administered 2013-11-23: 1 [IU] via SUBCUTANEOUS
  Administered 2013-11-24: 3 [IU] via SUBCUTANEOUS
  Administered 2013-11-25 – 2013-11-26 (×2): 2 [IU] via SUBCUTANEOUS
  Administered 2013-11-26: 1 [IU] via SUBCUTANEOUS
  Administered 2013-11-27: 2 [IU] via SUBCUTANEOUS

## 2013-11-22 MED ORDER — LOPERAMIDE HCL 2 MG PO CAPS: 2.0000 mg | ORAL_CAPSULE | ORAL | Status: AC | PRN

## 2013-11-22 MED ORDER — HYDROXYZINE HCL 25 MG PO TABS
25.0000 mg | ORAL_TABLET | Freq: Four times a day (QID) | ORAL | Status: DC | PRN
  Filled 2013-11-22: qty 1

## 2013-11-22 MED ORDER — ADULT MULTIVITAMIN W/MINERALS CH
1.0000 | ORAL_TABLET | Freq: Every day | ORAL | Status: DC
  Administered 2013-11-23 – 2013-11-27 (×5): 1 via ORAL
  Filled 2013-11-22 (×6): qty 1

## 2013-11-22 MED ORDER — CHLORDIAZEPOXIDE HCL 25 MG PO CAPS
25.0000 mg | ORAL_CAPSULE | Freq: Four times a day (QID) | ORAL | Status: AC | PRN
  Administered 2013-11-23 – 2013-11-24 (×3): 25 mg via ORAL
  Filled 2013-11-22 (×2): qty 1
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
## 2013-11-22 NOTE — Progress Notes (Signed)
Nutrition Brief Note  Patient identified on the Malnutrition Screening Tool (MST) Report  Wt Readings from Last 15 Encounters:  11/22/13 208 lb 5.4 oz (94.5 kg)  11/13/13 207 lb 14.3 oz (94.3 kg)  10/06/13 209 lb (94.802 kg)  09/23/13 209 lb (94.802 kg)  08/29/13 209 lb (94.802 kg)  04/28/13 200 lb 9.6 oz (90.992 kg)  11/30/12 188 lb (85.276 kg)  11/11/12 171 lb (77.565 kg)  09/24/12 185 lb (83.915 kg)  09/24/12 195 lb 3.2 oz (88.542 kg)  09/19/12 183 lb (83.008 kg)  08/04/12 188 lb (85.276 kg)  05/09/12 189 lb 6.4 oz (85.911 kg)  03/16/12 187 lb 9.8 oz (85.1 kg)  01/04/12 182 lb (82.555 kg)    Body mass index is 36.91 kg/(m^2). Patient meets criteria for Obesity II based on current BMI.   Current diet order is Regular, patient is consuming approximately 50% of meals at this time. Labs and medications reviewed.   Pt lethargic, unable to provide food/nutrition hx. Per previous medical records, pt's weight stable. Was evaluated by RD during previous admission < two weeks ago, reported a one meal/day meal intake, and supplemented with Atkins shakes. Was ordered Ensure Complete BID  Pt's sitter reported pt required feeding assistance at breakfast d/t lethargy and inability to coordinate utensils. Consumed approximately 50% of meal, and drinking multiple 8 oz cartons of milk. Followed up at lunch, sitter informed RD that pt continues to be drowsy and was placed NPO.   No nutrition interventions warranted at this time. If nutrition issues arise, please consult RD.   Lloyd HugerSarah F Mylo Driskill MS RD LDN Clinical Dietitian Pager:[phone]

## 2013-11-22 NOTE — Progress Notes (Signed)
[facility] PROGRESS NOTE  Lynn Palmer ZOX:[id number] DOB: 1960/10/08 DOA: 11/21/2013 PCP: Nadean CorwinMCKEOWN,WILLIAM DAVID, MD  Assessment/Plan: 1-encephalopathy: secondary to presumed intentional overdose with klonopin  -continue supportive care  -avoid benzo's  -follow psych rec's  -will keep sitter at bedside   2-hx of alcohol abuse: patient with early symptoms of withdrawal. -will use librium in order to avoid benzo's  3-bipolar disorder:  -valproic acid was elevated on admission; will hold for now -psych has been consulted and will follow rec's  -continue holding prozac and benzodiazepine for now   4-HTN: BP stable w/o any medication regimen  -will continue IVF's and for now will hold on antihypertensive meds -patient is also NPO   5-polysubstance abuse: SW/Psych consulted.  -will follow rec's -patient is well known and has hx of recurrent PSA  6-DM type 2: last A1C 6.0  -continue SSI  -metformin on hold while inpatient   7-insomnia: will continue trazodone once alert.  8-HLD: will resume statins, when tolerating PO's   Code Status: Full Family Communication: no family at bedside Disposition Plan: to be determine    Consultants:  Psych   Procedures:  None   Antibiotics:  None   HPI/Subjective: Afebrile, lethargic/somnolent and confused.  Objective: Filed Vitals:   11/22/13 1351  BP: 133/76  Pulse: 66  Temp: 97.5 F (36.4 C)  Resp: 16    Intake/Output Summary (Last 24 hours) at 11/22/13 1731 Last data filed at 11/22/13 1500  Gross per 24 hour  Intake    300 ml  Output      0 ml  Net    300 ml   Filed Weights   11/21/13 1759 11/22/13 0519  Weight: 94.802 kg (209 lb) 94.5 kg (208 lb 5.4 oz)    Exam:   General:  Lethargic, somnolent and confused. Patient is afebrile. Unable to follow commands  Cardiovascular: S1 and S2, no rubs or gallops  Respiratory: CTA bilaterally  Abdomen: soft, NT, ND, positive BS  Musculoskeletal: no  joint swelling or erythema  Data Reviewed: Basic Metabolic Panel:  Recent Labs Lab 11/21/13 1555 11/22/13 0440  NA 141 140  K 4.1 3.6*  CL 104 103  CO2 26 26  GLUCOSE 148* 125*  BUN 20 11  CREATININE 1.13* 0.99  CALCIUM 8.9 9.3   Liver Function Tests:  Recent Labs Lab 11/21/13 1555 11/22/13 0440  AST 29 28  ALT 23 22  ALKPHOS 70 79  BILITOT 0.4 0.7  PROT 6.2 6.7  ALBUMIN 3.1* 3.2*   CBC:  Recent Labs Lab 11/21/13 1424 11/22/13 0440  WBC 5.9 7.7  HGB 10.2* 11.0*  HCT 30.3* 33.5*  MCV 83.9 84.0  PLT 343 289   CBG:  Recent Labs Lab 11/21/13 1404 11/21/13 1950 11/22/13 0729  GLUCAP 134* 163* 115*    Recent Results (from the past 240 hour(s))  CULTURE, BLOOD (ROUTINE X 2)     Status: None   Collection Time    11/13/13 12:48 PM      Result Value Ref Range Status   Specimen Description BLOOD RAC   Final   Special Requests BOTTLES DRAWN AEROBIC AND ANAEROBIC 3ML   Final   Culture  Setup Time     Final   Value: 11/13/2013 16:09     Performed at [facility]   Culture     Final   Value: NO GROWTH 5 DAYS     Performed at [facility]   Report Status 11/19/2013 FINAL   Final  CULTURE, BLOOD (ROUTINE X 2)     Status: None   Collection Time    11/13/13 12:48 PM      Result Value Ref Range Status   Specimen Description BLOOD RAC   Final   Special Requests BOTTLES DRAWN AEROBIC AND ANAEROBIC   Final   Culture  Setup Time     Final   Value: 11/13/2013 16:09     Performed at [facility]   Culture     Final   Value: NO GROWTH 5 DAYS     Performed at [facility]   Report Status 11/19/2013 FINAL   Final  URINE CULTURE     Status: None   Collection Time    11/13/13  1:03 PM      Result Value Ref Range Status   Specimen Description URINE, CATHETERIZED   Final   Special Requests Normal   Final   Culture  Setup Time     Final   Value: 11/13/2013 20:05     Performed at [facility] Count     Final    Value: NO GROWTH     Performed at [facility]   Culture     Final   Value: NO GROWTH     Performed at [facility]   Report Status 11/14/2013 FINAL   Final  MRSA PCR SCREENING     Status: None   Collection Time    11/13/13  9:39 PM      Result Value Ref Range Status   MRSA by PCR NEGATIVE  NEGATIVE Final   Comment:            The GeneXpert MRSA Assay (FDA     approved for NASAL specimens     only), is one component of a     comprehensive MRSA colonization     surveillance program. It is not     intended to diagnose MRSA     infection nor to guide or     monitor treatment for     MRSA infections.     Studies: No results found.  Scheduled Meds: . amLODipine  5 mg Oral q morning - 10a  . antiseptic oral rinse  7 mL Mouth Rinse BID  . chlordiazePOXIDE  25 mg Oral Once  . chlordiazePOXIDE  25 mg Oral QID   Followed by  . [START ON 11/23/2013] chlordiazePOXIDE  25 mg Oral TID   Followed by  . [START ON 11/24/2013] chlordiazePOXIDE  25 mg Oral BH-qamhs   Followed by  . [START ON 11/25/2013] chlordiazePOXIDE  25 mg Oral Daily  . gabapentin  300 mg Oral TID  . metFORMIN  500 mg Oral BID WC  . metoprolol tartrate  25 mg Oral BID  . multivitamin with minerals  1 tablet Oral Daily  . simvastatin  10 mg Oral q1800  . sodium chloride  3 mL Intravenous Q12H  . thiamine  100 mg Intramuscular Once  . [START ON 11/23/2013] thiamine  100 mg Oral Daily  . traZODone  100 mg Oral QHS   Continuous Infusions: . sodium chloride 75 mL/hr at 11/22/13 1500    Active Problems:   Altered mental state    Time spent: <30 minutes    Vassie Loll  [facility] Pager [phone]. If 7PM-7AM, please contact night-coverage at [email or web address], password [facility] 11/22/2013, 5:31 PM  LOS: 1 day

## 2013-11-22 NOTE — Progress Notes (Signed)
Clinical Social Work  CSW and psych MD attempted to meet with patient to complete assessment. Patient drowsy and would not awaken for assessment. CSW will follow up at later time.  DukedomHolly Clemmie [place], [place] [phone]

## 2013-11-22 NOTE — Progress Notes (Signed)
Clinical Social Work  CSW attempted to meet with patient again in order to complete assessment. Patient remains drowsy and unable to participate at this time.  [name], [place] [phone]

## 2013-11-22 NOTE — Progress Notes (Signed)
Assumed care of patient. No changes in initial am assessment. Cont with plan of care. Pt resting quietly in bed. Sitter at bedside

## 2013-11-23 DIAGNOSIS — E785 Hyperlipidemia, unspecified: Secondary | ICD-10-CM

## 2013-11-23 DIAGNOSIS — F3132 Bipolar disorder, current episode depressed, moderate: Secondary | ICD-10-CM

## 2013-11-23 DIAGNOSIS — E1149 Type 2 diabetes mellitus with other diabetic neurological complication: Secondary | ICD-10-CM

## 2013-11-23 DIAGNOSIS — E1142 Type 2 diabetes mellitus with diabetic polyneuropathy: Secondary | ICD-10-CM

## 2013-11-23 LAB — GLUCOSE, CAPILLARY
Glucose-Capillary: 84 mg/dL (ref 70–99)
Glucose-Capillary: 119 mg/dL — ABNORMAL HIGH (ref 70–99)
Glucose-Capillary: 136 mg/dL — ABNORMAL HIGH (ref 70–99)
Glucose-Capillary: 124 mg/dL — ABNORMAL HIGH (ref 70–99)

## 2013-11-23 LAB — VALPROIC ACID LEVEL: VALPROIC ACID LVL: 27.6 ug/mL — AB (ref 50.0–100.0)

## 2013-11-23 MED ORDER — SIMVASTATIN 10 MG PO TABS
10.0000 mg | ORAL_TABLET | Freq: Every day | ORAL | Status: DC
  Administered 2013-11-23 – 2013-11-26 (×4): 10 mg via ORAL
  Filled 2013-11-23 (×5): qty 1

## 2013-11-23 MED ORDER — POTASSIUM CHLORIDE CRYS ER 20 MEQ PO TBCR
40.0000 meq | EXTENDED_RELEASE_TABLET | ORAL | Status: AC
  Administered 2013-11-23 (×2): 40 meq via ORAL
  Filled 2013-11-23 (×2): qty 2

## 2013-11-23 MED ORDER — TRAZODONE HCL 100 MG PO TABS
100.0000 mg | ORAL_TABLET | Freq: Every day | ORAL | Status: DC
  Administered 2013-11-23 – 2013-11-26 (×4): 100 mg via ORAL
  Filled 2013-11-23: qty 1
  Filled 2013-11-23: qty 2
  Filled 2013-11-23 (×4): qty 1

## 2013-11-23 MED ORDER — GABAPENTIN 100 MG PO CAPS
100.0000 mg | ORAL_CAPSULE | Freq: Three times a day (TID) | ORAL | Status: DC
  Administered 2013-11-23 – 2013-11-27 (×12): 100 mg via ORAL
  Filled 2013-11-23 (×15): qty 1

## 2013-11-23 MED ORDER — VALPROIC ACID 250 MG PO CAPS
500.0000 mg | ORAL_CAPSULE | Freq: Two times a day (BID) | ORAL | Status: DC
  Administered 2013-11-23 – 2013-11-26 (×7): 500 mg via ORAL
  Filled 2013-11-23 (×8): qty 2
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
## 2013-11-23 NOTE — Progress Notes (Signed)
Pt slept most of night. Pt only woke up for assessment, vitals, to urinate, and CBGs. Pt asking for something to eat, but was reminded of NPO status. Pt stated to ask the MD to change that so she could eat and also does not want a sitter anymore so she can walk around and talk to people. Will report this to day shift so it can be relayed to MD. Pt complaining of mild pain in abdomen, but believes that is is due to hunger. Will continue to monitor.

## 2013-11-23 NOTE — Progress Notes (Signed)
Spiritual care follow up for support around readmission / overdose.   Familiar with pt from previous admissions.  Pt lying in bed with sitter present.  Labile affect. Spoke with chaplain about relapse, stating that she had spent some time with her daughter and began drinking.   Was tearful around relapse and stated fears that her boyfriend, [name], is going to leave her.  [name] had been present earlier in afternoon and reports from nursing are he was supportive of patient during his visit.   Lynn Palmer stated she thought her next steps were to go to longer term treatment and she is open to this.  Spoke with chaplain about what was helpful for her in treatment on prior admissions.    Lynn Palmer wishes to contact her sponsor to let her know that she is in hospital.     [name], [name] [name] MDiv

## 2013-11-23 NOTE — Progress Notes (Addendum)
As patient became more alert in the AM she became tearful and paranoid. Pt was asking staff if the staff thought she had schizophrenia. Staff informed her we could not diagnose her and this concern should be discussed with the MD who would be able to diagnose. Pt's CIWA scale increased with alertness. Pt believes that she is better and is ready to go.  Will continue to monitor. Will alert day shift.
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
## 2013-11-23 NOTE — Progress Notes (Signed)
[facility] PROGRESS NOTE  Lynn Palmer QMV:[id number] DOB: Aug 26, 1960 DOA: 11/21/2013 PCP: Nadean CorwinMCKEOWN,WILLIAM DAVID, MD  Assessment/Plan: 1-encephalopathy: secondary to presumed intentional overdose with klonopin  -continue supportive care  -Continue avoiding the use of benzo's  -follow psych rec's  -will keep sitter at bedside for now.  2-hx of alcohol abuse: patient with early symptoms of withdrawal. -will use librium in order to avoid benzo's  3-bipolar disorder:  -valproic acid mildly elevated on admission. At this moment plan is to resume and use 500 mg twice a day. -psych has been consulted and will follow rec's  -continue holding prozac and benzodiazepine for now; await psychiatry recommendations from the strokes.  4-HTN: BP stable w/o any medication regimen  -will resume the metoprolol   5-polysubstance abuse: SW/Psych consulted.  -will follow rec's -patient is well known and has hx of recurrent PSA -Will benefit in my opinion of inpatient program (for suitable psychotherapy along with medication regimen).  6-DM type 2: last A1C 6.0  -continue SSI  -metformin on hold while inpatient   7-insomnia: will continue trazodone QHS.  8-HLD: will resume statins  9-hypokalemia: Will replete and follow potassium trend   Code Status: Full Family Communication: no family at bedside Disposition Plan: to be determine    Consultants:  Psych   Procedures:  None   Antibiotics:  None   HPI/Subjective: Afebrile. Labile mood with crying spells throughout interview. No CP or SOB. Able to follow commands.  Objective: Filed Vitals:   11/23/13 1426  BP: 138/85  Pulse: 68  Temp: 98 F (36.7 C)  Resp: 15    Intake/Output Summary (Last 24 hours) at 11/23/13 1433 Last data filed at 11/23/13 1428  Gross per 24 hour  Intake   2145 ml  Output      0 ml  Net   2145 ml   Filed Weights   11/21/13 1759 11/22/13 0519 11/23/13 0500  Weight: 94.802 kg (209 lb) 94.5  kg (208 lb 5.4 oz) 95.255 kg (210 lb)    Exam:   General:  Alert, awake and oriented x2; afebrile ear patient come Versed this unable to follow commands. She continued to have crying spells throughout interview base labile mood.  Cardiovascular: S1 and S2, no rubs or gallops  Respiratory: CTA bilaterally  Abdomen: soft, NT, ND, positive BS  Musculoskeletal: no joint swelling or erythema  Data Reviewed: Basic Metabolic Panel:  Recent Labs Lab 11/21/13 1555 11/22/13 0440  NA 141 140  K 4.1 3.6*  CL 104 103  CO2 26 26  GLUCOSE 148* 125*  BUN 20 11  CREATININE 1.13* 0.99  CALCIUM 8.9 9.3   Liver Function Tests:  Recent Labs Lab 11/21/13 1555 11/22/13 0440  AST 29 28  ALT 23 22  ALKPHOS 70 79  BILITOT 0.4 0.7  PROT 6.2 6.7  ALBUMIN 3.1* 3.2*   CBC:  Recent Labs Lab 11/21/13 1424 11/22/13 0440  WBC 5.9 7.7  HGB 10.2* 11.0*  HCT 30.3* 33.5*  MCV 83.9 84.0  PLT 343 289   CBG:  Recent Labs Lab 11/21/13 1950 11/22/13 0729 11/22/13 2209 11/23/13 0729 11/23/13 1138  GLUCAP 163* 115* 115* 84 119*    Recent Results (from the past 240 hour(s))  MRSA PCR SCREENING     Status: None   Collection Time    11/13/13  9:39 PM      Result Value Ref Range Status   MRSA by PCR NEGATIVE  NEGATIVE Final   Comment:  The GeneXpert MRSA Assay (FDA     approved for NASAL specimens     only), is one component of a     comprehensive MRSA colonization     surveillance program. It is not     intended to diagnose MRSA     infection nor to guide or     monitor treatment for     MRSA infections.     Studies: No results found.  Scheduled Meds: . antiseptic oral rinse  7 mL Mouth Rinse BID  . chlordiazePOXIDE  25 mg Oral Once  . chlordiazePOXIDE  25 mg Oral TID   Followed by  . [START ON 11/24/2013] chlordiazePOXIDE  25 mg Oral BH-qamhs   Followed by  . [START ON 11/25/2013] chlordiazePOXIDE  25 mg Oral Daily  . gabapentin  100 mg Oral TID  . insulin  aspart  0-9 Units Subcutaneous TID WC  . metoprolol tartrate  25 mg Oral BID  . multivitamin with minerals  1 tablet Oral Daily  . simvastatin  10 mg Oral q1800  . sodium chloride  3 mL Intravenous Q12H  . thiamine  100 mg Intramuscular Once  . thiamine  100 mg Oral Daily  . traZODone  100 mg Oral QHS  . valproic acid  500 mg Oral BID   Continuous Infusions:    Active Problems:   Altered mental state    Time spent: <30 minutes    Vassie Loll  [facility] Pager [phone]. If 7PM-7AM, please contact night-coverage at [email or web address], password [facility] 11/23/2013, 2:33 PM  LOS: 2 days

## 2013-11-24 LAB — BASIC METABOLIC PANEL
Anion gap: 11 (ref 5–15)
BUN: 12 mg/dL (ref 6–23)
CALCIUM: 9.4 mg/dL (ref 8.4–10.5)
CO2: 25 mEq/L (ref 19–32)
CREATININE: 1.08 mg/dL (ref 0.50–1.10)
Chloride: 103 mEq/L (ref 96–112)
GFR calc non Af Amer: 58 mL/min — ABNORMAL LOW (ref >90)
GFR, EST AFRICAN AMERICAN: 67 mL/min — AB (ref >90)
Glucose, Bld: 117 mg/dL — ABNORMAL HIGH (ref 70–99)
Potassium: 4.6 mEq/L (ref 3.7–5.3)
Sodium: 139 mEq/L (ref 137–147)

## 2013-11-24 LAB — GLUCOSE, CAPILLARY
Glucose-Capillary: 108 mg/dL — ABNORMAL HIGH (ref 70–99)
Glucose-Capillary: 104 mg/dL — ABNORMAL HIGH (ref 70–99)
Glucose-Capillary: 228 mg/dL — ABNORMAL HIGH (ref 70–99)

## 2013-11-24 LAB — VALPROIC ACID LEVEL: Valproic Acid Lvl: 61.7 ug/mL (ref 50.0–100.0)

## 2013-11-24 MED ORDER — CHLORDIAZEPOXIDE HCL 25 MG PO CAPS
25.0000 mg | ORAL_CAPSULE | Freq: Every day | ORAL | Status: AC
  Administered 2013-11-25: 25 mg via ORAL
  Filled 2013-11-24: qty 1

## 2013-11-24 MED ORDER — CHLORDIAZEPOXIDE HCL 25 MG PO CAPS
25.0000 mg | ORAL_CAPSULE | Freq: Once | ORAL | Status: AC
  Administered 2013-11-25: 25 mg via ORAL
  Filled 2013-11-24: qty 1

## 2013-11-24 NOTE — Progress Notes (Signed)
[facility] PROGRESS NOTE  Lynn Palmer ZOX:[id number] DOB: [birth year] DOA: 11/21/2013 PCP: Lynn Corwin, MD  Assessment/Plan: 1-encephalopathy: secondary to presumed intentional overdose with klonopin  -continue supportive care  -Continue avoiding the use of benzo's  -follow psych rec's  -will keep sitter at bedside for now.  2-hx of alcohol abuse: patient with early symptoms of withdrawal. -will use librium in order to avoid benzo's -patient is overall calmer today (8/14)  3-bipolar disorder:  -valproic acid mildly elevated on admission. At this moment plan is to resume and use 500 mg twice a day. -psych has been consulted and will follow rec's  -continue holding prozac and benzodiazepine for now; await psychiatry recommendations before resuming these meds  4-HTN: BP stable w/o any medication regimen  -will resume the metoprolol   5-polysubstance abuse: SW/Psych consulted.  -will follow rec's -patient is well known and has hx of recurrent PSA -Will benefit in my opinion of inpatient program (where she can received suitable psychotherapy along with medication regimen).  6-DM type 2: last A1C 6.0  -continue SSI  -metformin on hold while inpatient   7-insomnia: will continue trazodone QHS.  8-HLD: will continue statins  9-hypokalemia: repleted -latest potassium 4.6 (11/24/13)   Code Status: Full Family Communication: no family at bedside Disposition Plan: to be determine by Psych. Medically stable for next venue.    Consultants:  Psych   Procedures:  None   Antibiotics:  None   HPI/Subjective:  Alert, awake and more oriented this morning; afebrile. Able to follow commands and carried conversation. She continued to experienced very labile mood (flat affect, crying spells and anxiety). Denies SI and/or hallucinations.  Objective: Filed Vitals:   11/24/13 1429  BP: 126/79  Pulse: 71  Temp: 100.3 F (37.9 C)  Resp: 18    Intake/Output  Summary (Last 24 hours) at 11/24/13 1603 Last data filed at 11/24/13 1431  Gross per 24 hour  Intake   1320 ml  Output      0 ml  Net   1320 ml   Filed Weights   11/21/13 1759 11/22/13 0519 11/23/13 0500  Weight: 94.802 kg (209 lb) 94.5 kg (208 lb 5.4 oz) 95.255 kg (210 lb)    Exam:   General:  Alert, awake and more oriented this morning; afebrile. Able to follow commands and carried conversation. She continued to experienced very labile mood (flat affect, crying spells and anxiety). Denies SI and/or hallucinations.  Cardiovascular: S1 and S2, no rubs or gallops  Respiratory: CTA bilaterally  Abdomen: soft, NT, ND, positive BS  Musculoskeletal: no joint swelling or erythema  Data Reviewed: Basic Metabolic Panel:  Recent Labs Lab 11/21/13 1555 11/22/13 0440 11/24/13 0515  NA 141 140 139  K 4.1 3.6* 4.6  CL 104 103 103  CO2 26 26 25   GLUCOSE 148* 125* 117*  BUN 20 11 12   CREATININE 1.13* 0.99 1.08  CALCIUM 8.9 9.3 9.4   Liver Function Tests:  Recent Labs Lab 11/21/13 1555 11/22/13 0440  AST 29 28  ALT 23 22  ALKPHOS 70 79  BILITOT 0.4 0.7  PROT 6.2 6.7  ALBUMIN 3.1* 3.2*   CBC:  Recent Labs Lab 11/21/13 1424 11/22/13 0440  WBC 5.9 7.7  HGB 10.2* 11.0*  HCT 30.3* 33.5*  MCV 83.9 84.0  PLT 343 289   CBG:  Recent Labs Lab 11/23/13 1138 11/23/13 1617 11/23/13 2105 11/24/13 0758 11/24/13 1143  GLUCAP 119* 136* 124* 108* 104*    No results found for this  or any previous visit (from the past 240 hour(s)).   Studies: No results found.  Scheduled Meds: . antiseptic oral rinse  7 mL Mouth Rinse BID  . chlordiazePOXIDE  25 mg Oral Once  . chlordiazePOXIDE  25 mg Oral Once   Followed by  . [START ON 11/25/2013] chlordiazePOXIDE  25 mg Oral Daily  . gabapentin  100 mg Oral TID  . insulin aspart  0-9 Units Subcutaneous TID WC  . metoprolol tartrate  25 mg Oral BID  . multivitamin with minerals  1 tablet Oral Daily  . simvastatin  10 mg Oral  q1800  . sodium chloride  3 mL Intravenous Q12H  . thiamine  100 mg Intramuscular Once  . thiamine  100 mg Oral Daily  . traZODone  100 mg Oral QHS  . valproic acid  500 mg Oral BID   Continuous Infusions:    Active Problems:   Altered mental state    Time spent: <30 minutes    Lynn Palmer, Lynn Palmer  [facility] Pager [phone]. If 7PM-7AM, please contact night-coverage at [email or web address], password [email or web address] 11/24/2013, 4:03 PM  LOS: 3 days

## 2013-11-24 NOTE — Progress Notes (Signed)
Clinical Social Work  [facility] could schedule patient for screening appointment on Wednesday 8/19 at 8am in order to discuss behavior contract since she was kicked out due to arguing with staff at previous stay. CSW discussed case with psych MD who reports he would [facility] inpatient [facility]. CSW discussed plans with patient who is agreeable. CSW contacted the following facilities regarding placement:  [facility]- available beds. Referral faxed.    [facility]- AC will review but reports no available beds at this time  [facility].  [facility]- no available beds   [facility].  [facility]- no available beds   [facility] Referral faxed.   [facility]- no available beds   [facility]- available beds. referral faxed.  RamonaHolly Darielle Hancher, [place] [phone]

## 2013-11-24 NOTE — Progress Notes (Signed)
Clinical Social Work  MD reports that patient is medically stable to DC. CSW actively working on residential SA treatment options. CSW left a message with [facility] and waiting return call. [facility] reports they currently have a 2-3 week waiting list. [facility] reports that they are only accepting males at this time. CSW will [facility] and [facility] with patient [facility].  WilsonvilleHolly Colvin Blatt, [place] [phone]

## 2013-11-25 LAB — GLUCOSE, CAPILLARY
GLUCOSE-CAPILLARY: 111 mg/dL — AB (ref 70–99)
GLUCOSE-CAPILLARY: 101 mg/dL — AB (ref 70–99)
Glucose-Capillary: 182 mg/dL — ABNORMAL HIGH (ref 70–99)
Glucose-Capillary: 115 mg/dL — ABNORMAL HIGH (ref 70–99)
Glucose-Capillary: 189 mg/dL — ABNORMAL HIGH (ref 70–99)

## 2013-11-25 LAB — VALPROIC ACID LEVEL: VALPROIC ACID LVL: 62.8 ug/mL (ref 50.0–100.0)

## 2013-11-25 MED ORDER — TRAMADOL HCL 50 MG PO TABS: 50.0000 mg | ORAL_TABLET | Freq: Three times a day (TID) | ORAL | Status: DC | PRN

## 2013-11-25 MED ORDER — TRAMADOL HCL 50 MG PO TABS
50.0000 mg | ORAL_TABLET | Freq: Three times a day (TID) | ORAL | Status: DC | PRN
  Administered 2013-11-25 – 2013-11-27 (×4): 50 mg via ORAL
  Filled 2013-11-25 (×4): qty 1

## 2013-11-25 NOTE — Progress Notes (Signed)
[facility] PROGRESS NOTE  Lynn Palmer [id number]:[id number] DOB: Feb 02, 1961 DOA: 11/21/2013 PCP: [name], MD  Assessment/Plan: 1-encephalopathy: secondary to presumed intentional overdose with klonopin  -continue supportive care  -Continue avoiding the use of benzo's  -follow psych rec's  -will keep sitter at bedside for now.  2-hx of alcohol abuse: patient with early symptoms of withdrawal. -will use librium in order to avoid benzo's -patient is overall calmer today (8/14)  3-bipolar disorder:  -valproic acid mildly elevated on admission. At this moment plan is to resume and use 500 mg twice a day. -psych has been consulted and will follow rec's  -continue holding prozac and benzodiazepine for now; await psychiatry recommendations before resuming these meds  4-HTN: BP stable w/o any medication regimen  -will resume the metoprolol   5-polysubstance abuse: SW/Psych consulted.  -will follow rec's -patient is well known and has hx of recurrent PSA -Will benefit in my opinion of inpatient program (where she can received suitable psychotherapy along with medication regimen).  6-DM type 2: last A1C 6.0  -continue SSI  -metformin on hold while inpatient   7-insomnia: will continue trazodone QHS.  8-HLD: will continue statins  9-hypokalemia: repleted -latest potassium 4.6 (11/24/13)  10-back pain: chronic. -will use PRN tramadol (low dose)   Code Status: Full Family Communication: no family at bedside Disposition Plan: to be determine by Psych. Medically stable for next venue.    Consultants:  Psych   Procedures:  None   Antibiotics:  None   HPI/Subjective:  Alert, awake and more oriented this morning; afebrile. Able to follow commands and carried conversation. She continued to experienced very labile mood (flat affect, crying spells and anxiety). Denies SI and/or hallucinations. Complaining of back pain not relived by tylenol   Objective: Filed  Vitals:   11/25/13 1442  BP: 103/57  Pulse: 67  Temp: 98.1 F (36.7 C)  Resp: 18    Intake/Output Summary (Last 24 hours) at 11/25/13 1522 Last data filed at 11/25/13 0900  Gross per 24 hour  Intake    240 ml  Output      0 ml  Net    240 ml   Filed Weights   11/22/13 0519 11/23/13 0500 11/25/13 0541  Weight: 94.5 kg (208 lb 5.4 oz) 95.255 kg (210 lb) 94.2 kg (207 lb 10.8 oz)    Exam:   General:  Alert, awake and more oriented this morning; afebrile. Able to follow commands and carried conversation. She continued to experienced very labile mood (flat affect, crying spells and anxiety). Denies SI and/or hallucinations. Reports some back pain as well   Cardiovascular: S1 and S2, no rubs or gallops  Respiratory: CTA bilaterally  Abdomen: soft, NT, ND, positive BS  Musculoskeletal: no joint swelling or erythema  Data Reviewed: Basic Metabolic Panel:  Recent Labs Lab 11/21/13 1555 11/22/13 0440 11/24/13 0515  NA 141 140 139  K 4.1 3.6* 4.6  CL 104 103 103  CO2 26 26 25   GLUCOSE 148* 125* 117*  BUN 20 11 12   CREATININE 1.13* 0.99 1.08  CALCIUM 8.9 9.3 9.4   Liver Function Tests:  Recent Labs Lab 11/21/13 1555 11/22/13 0440  AST 29 28  ALT 23 22  ALKPHOS 70 79  BILITOT 0.4 0.7  PROT 6.2 6.7  ALBUMIN 3.1* 3.2*   CBC:  Recent Labs Lab 11/21/13 1424 11/22/13 0440  WBC 5.9 7.7  HGB 10.2* 11.0*  HCT 30.3* 33.5*  MCV 83.9 84.0  PLT 343 289  CBG:  Recent Labs Lab 11/24/13 1143 11/24/13 1644 11/24/13 2100 11/25/13 0659 11/25/13 1120  GLUCAP 104* 228* 111* 101* 182*    No results found for this or any previous visit (from the past 240 hour(s)).   Studies: No results found.  Scheduled Meds: . antiseptic oral rinse  7 mL Mouth Rinse BID  . chlordiazePOXIDE  25 mg Oral Once  . gabapentin  100 mg Oral TID  . insulin aspart  0-9 Units Subcutaneous TID WC  . metoprolol tartrate  25 mg Oral BID  . multivitamin with minerals  1 tablet Oral  Daily  . simvastatin  10 mg Oral q1800  . sodium chloride  3 mL Intravenous Q12H  . thiamine  100 mg Intramuscular Once  . thiamine  100 mg Oral Daily  . traZODone  100 mg Oral QHS  . valproic acid  500 mg Oral BID   Continuous Infusions:    Active Problems:   Altered mental state    Time spent: <30 minutes    [name], Nam Vossler  [facility] Pager [phone]. If 7PM-7AM, please contact night-coverage at [email or web address], password [email or web address] 11/25/2013, 3:22 PM  LOS: 4 days

## 2013-11-26 DIAGNOSIS — F1994 Other psychoactive substance use, unspecified with psychoactive substance-induced mood disorder: Secondary | ICD-10-CM

## 2013-11-26 DIAGNOSIS — M545 Low back pain, unspecified: Secondary | ICD-10-CM

## 2013-11-26 DIAGNOSIS — M533 Sacrococcygeal disorders, not elsewhere classified: Secondary | ICD-10-CM

## 2013-11-26 DIAGNOSIS — F411 Generalized anxiety disorder: Secondary | ICD-10-CM

## 2013-11-26 DIAGNOSIS — F3130 Bipolar disorder, current episode depressed, mild or moderate severity, unspecified: Secondary | ICD-10-CM

## 2013-11-26 LAB — GLUCOSE, CAPILLARY
Glucose-Capillary: 114 mg/dL — ABNORMAL HIGH (ref 70–99)
Glucose-Capillary: 136 mg/dL — ABNORMAL HIGH (ref 70–99)
Glucose-Capillary: 185 mg/dL — ABNORMAL HIGH (ref 70–99)
Glucose-Capillary: 103 mg/dL — ABNORMAL HIGH (ref 70–99)

## 2013-11-26 LAB — VALPROIC ACID LEVEL: VALPROIC ACID LVL: 62.2 ug/mL (ref 50.0–100.0)

## 2013-11-26 MED ORDER — VALPROIC ACID 250 MG PO CAPS
750.0000 mg | ORAL_CAPSULE | Freq: Every day | ORAL | Status: DC
  Administered 2013-11-26: 750 mg via ORAL
  Filled 2013-11-26 (×2): qty 3

## 2013-11-26 MED ORDER — VALPROIC ACID 250 MG PO CAPS
500.0000 mg | ORAL_CAPSULE | Freq: Every day | ORAL | Status: DC
  Administered 2013-11-27: 500 mg via ORAL
  Filled 2013-11-26: qty 2

## 2013-11-26 MED ORDER — CHLORDIAZEPOXIDE HCL 5 MG PO CAPS
10.0000 mg | ORAL_CAPSULE | Freq: Three times a day (TID) | ORAL | Status: DC
  Administered 2013-11-26 – 2013-11-27 (×3): 10 mg via ORAL
  Filled 2013-11-26 (×3): qty 2

## 2013-11-26 NOTE — Progress Notes (Signed)
[facility] PROGRESS NOTE  Lynn Palmer WUJ:[id number] DOB: 1961-02-16 DOA: 11/21/2013 PCP: Nadean CorwinMCKEOWN,WILLIAM DAVID, MD  Assessment/Plan: 1-encephalopathy: secondary to presumed intentional overdose with klonopin  -continue supportive care  -Continue avoiding the use of benzo's  -follow psych rec's  -will keep sitter at bedside for now.  2-hx of alcohol abuse: patient with early symptoms of withdrawal. -will continue using librium. Now TID low dose (just while inpatient, as recommended by psychiatry) -patient is overall calmer today (8/14)  3-bipolar disorder:  -valproic acid level 62; following rec's from psychiatry and trying to stabilize mood more (w/o use of benzo's) will increase dose by 250mg  daily (patient now will use 500 mg in am and 750 mg at night) -will also add low dose of librium TID -psych has been consulted and will follow rec's  -continue holding prozac and benzodiazepine for now  4-HTN: BP stable w/o any medication regimen  -will resume the metoprolol   5-polysubstance abuse: SW/Psych consulted.  -will follow rec's -patient is well known and has hx of recurrent PSA -Will benefit in my opinion of inpatient program (where she can received suitable psychotherapy along with medication regimen).  6-DM type 2: last A1C 6.0  -continue SSI  -metformin on hold while inpatient   7-insomnia: will continue trazodone QHS.  8-HLD: will continue statins  9-hypokalemia: repleted -latest potassium 4.6 (11/24/13) -BMET intermittently to follow electrolytes  10-back pain: chronic. -will continue using PRN tramadol (low dose)   Code Status: Full Family Communication: no family at bedside Disposition Plan: to be determine by Psych. Medically stable for next venue.    Consultants:  Psych   Procedures:  None   Antibiotics:  None   HPI/Subjective:  Alert, awake and more oriented this morning; afebrile. Reports back pain is better. Patient still anxious  and asking for something.  Objective: Filed Vitals:   11/26/13 0451  BP: 110/66  Pulse: 65  Temp: 98.1 F (36.7 C)  Resp: 16    Intake/Output Summary (Last 24 hours) at 11/26/13 1242 Last data filed at 11/26/13 1214  Gross per 24 hour  Intake    360 ml  Output      0 ml  Net    360 ml   Filed Weights   11/23/13 0500 11/25/13 0541 11/26/13 0451  Weight: 95.255 kg (210 lb) 94.2 kg (207 lb 10.8 oz) 94.1 kg (207 lb 7.3 oz)    Exam:   General:  Alert, awake and more oriented this morning; afebrile. Denies SI and/or hallucinations. Reports back pain is better. Asking for something for anxiety  Cardiovascular: S1 and S2, no rubs or gallops  Respiratory: CTA bilaterally  Abdomen: soft, NT, ND, positive BS  Musculoskeletal: no joint swelling or erythema  Data Reviewed: Basic Metabolic Panel:  Recent Labs Lab 11/21/13 1555 11/22/13 0440 11/24/13 0515  NA 141 140 139  K 4.1 3.6* 4.6  CL 104 103 103  CO2 26 26 25   GLUCOSE 148* 125* 117*  BUN 20 11 12   CREATININE 1.13* 0.99 1.08  CALCIUM 8.9 9.3 9.4   Liver Function Tests:  Recent Labs Lab 11/21/13 1555 11/22/13 0440  AST 29 28  ALT 23 22  ALKPHOS 70 79  BILITOT 0.4 0.7  PROT 6.2 6.7  ALBUMIN 3.1* 3.2*   CBC:  Recent Labs Lab 11/21/13 1424 11/22/13 0440  WBC 5.9 7.7  HGB 10.2* 11.0*  HCT 30.3* 33.5*  MCV 83.9 84.0  PLT 343 289   CBG:  Recent Labs Lab 11/25/13 0659  11/25/13 1120 11/25/13 1642 11/25/13 2034 11/26/13 0743  GLUCAP 101* 182* 115* 189* 114*    No results found for this or any previous visit (from the past 240 hour(s)).   Studies: No results found.  Scheduled Meds: . antiseptic oral rinse  7 mL Mouth Rinse BID  . chlordiazePOXIDE  10 mg Oral TID  . gabapentin  100 mg Oral TID  . insulin aspart  0-9 Units Subcutaneous TID WC  . metoprolol tartrate  25 mg Oral BID  . multivitamin with minerals  1 tablet Oral Daily  . simvastatin  10 mg Oral q1800  . sodium chloride  3  mL Intravenous Q12H  . thiamine  100 mg Intramuscular Once  . thiamine  100 mg Oral Daily  . traZODone  100 mg Oral QHS  . [START ON 11/27/2013] valproic acid  500 mg Oral Daily  . valproic acid  750 mg Oral QHS   Continuous Infusions:    Active Problems:   Altered mental state    Time spent: <30 minutes    Vassie Loll  [facility] Pager [phone]. If 7PM-7AM, please contact night-coverage at [email or web address], password [facility] 11/26/2013, 12:42 PM  LOS: 5 days
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
## 2013-11-27 LAB — GLUCOSE, CAPILLARY
GLUCOSE-CAPILLARY: 181 mg/dL — AB (ref 70–99)
Glucose-Capillary: 103 mg/dL — ABNORMAL HIGH (ref 70–99)

## 2013-11-27 MED ORDER — ADULT MULTIVITAMIN W/MINERALS CH: 1.0000 | ORAL_TABLET | Freq: Every day | ORAL | Status: DC

## 2013-11-27 MED ORDER — CLOTRIMAZOLE 1 % EX CREA: 1.0000 "application " | TOPICAL_CREAM | Freq: Two times a day (BID) | CUTANEOUS | Status: DC

## 2013-11-27 MED ORDER — DIVALPROEX SODIUM ER 250 MG PO TB24: ORAL_TABLET | ORAL | Status: DC

## 2013-11-27 NOTE — Progress Notes (Signed)
Patient given bus pass and RN went over AVS, patient verbalized understanding of appointments and medications. Patient declined to sign AVS because she stated she didn't agree to being discharged at this time. Patient then proceeds to walk out of room and attempted to leave on her own. RN asked for her to be escorted with staff. She agreed to this. Security walked patient out. J.Nechemia Chiappetta, RN

## 2013-11-27 NOTE — Evaluation (Signed)
Physical Therapy Evaluation Patient Details Name: Lynn Palmer MRN: [id number] DOB: January 21, 1961 Today's Date: 11/27/2013   History of Present Illness  Pt is 53 yo female with bipolar disorder and DM type II, alcohol abuse, recently in hospital for suicidal attempt who [facility]21/2013 with altered ental status and decreased level of consciousness. Pt apparently took unknown amount of Klonopin but has denied suicidal ideations. In addition, her CBG was low 46.   Clinical Impression  **Pt admitted with AMS, intentional overdose, h/o bipolar, DM2, polysubstance abus*. Pt currently with functional limitations due to the deficits listed below (see PT Problem List).  Pt will benefit from skilled PT to increase their independence and safety with mobility to allow discharge to the venue listed below.    Pt ambulated 400' with RW without LOB. RW recommended.  *    Follow Up Recommendations No PT follow up    Equipment Recommendations  Rolling walker with 5" wheels    Recommendations for Other Services       Precautions / Restrictions Precautions Precautions: Fall Precaution Comments: pt reports 3 falls in past couple months, all were related to substance use Restrictions Weight Bearing Restrictions: No      Mobility  Bed Mobility Overal bed mobility: Modified Independent             General bed mobility comments: with rail  Transfers Overall transfer level: Modified independent               General transfer comment: pushed up from bedrail  Ambulation/Gait Ambulation/Gait assistance: Supervision Ambulation Distance (Feet): 400 Feet Assistive device: Rolling walker (2 wheeled)     Gait velocity interpretation: Below normal speed for age/gender General Gait Details: initially pt walked without AD, but reached for handrails, With trial of RW pt reported she felt more steady, no LOB with RW.   Stairs            Wheelchair Mobility    Modified  Rankin (Stroke Patients Only)       Balance Overall balance assessment: History of Falls (steady with RW)                                           Pertinent Vitals/Pain Pain Assessment: 0-10 Pain Score: 2  Pain Location: B knees with walking; pt stated she has degenerative joint disease due to strain on joints from weighing 350# prior to having lap band procedure; knee pain is chronic Pain Descriptors / Indicators: Aching Pain Intervention(s): Monitored during session    Home Living Family/patient expects to be discharged to:: Other (Comment) (plans to DC to drug rehab facility) Living Arrangements: Alone                    Prior Function Level of Independence: Independent         Comments: pt reports she doesn't use an assistive device, but has had several falls in past few months, she stated falls are related to substance use     Hand Dominance        Extremity/Trunk Assessment   Upper Extremity Assessment: Overall WFL for tasks assessed           Lower Extremity Assessment: RLE deficits/detail RLE Deficits / Details: knee extension -4/5, hip flexion -4/5    Cervical / Trunk Assessment: Kyphotic (forward head posture)  Communication   Communication:  No difficulties  Cognition Arousal/Alertness: Awake/alert Behavior During Therapy: WFL for tasks assessed/performed Overall Cognitive Status: Within Functional Limits for tasks assessed                      General Comments      Exercises        Assessment/Plan    PT Assessment Patient needs continued PT services  PT Diagnosis Generalized weakness   PT Problem List Decreased balance;Decreased strength;Decreased knowledge of use of DME  PT Treatment Interventions DME instruction;Stair training;Gait training;Therapeutic activities;Therapeutic exercise;Balance training   PT Goals (Current goals can be found in the Care Plan section) Acute Rehab PT Goals Patient Stated  Goal: to get stronger PT Goal Formulation: With patient Time For Goal Achievement: 12/11/13 Potential to Achieve Goals: Good    Frequency Min 3X/week   Barriers to discharge        Co-evaluation               End of Session Equipment Utilized During Treatment: Gait belt Activity Tolerance: Patient tolerated treatment well Patient left: in bed;with call bell/phone within reach;with nursing/sitter in room Nurse Communication: Mobility status         Time: 1350-1410 PT Time Calculation (min): 20 min   Charges:   PT Evaluation $Initial PT Evaluation Tier I: 1 Procedure PT Treatments $Gait Training: 8-22 mins   PT G Codes:          Tamala Ser 11/27/2013, 2:19 PM [phone]
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
## 2013-11-27 NOTE — Progress Notes (Signed)
Clinical Social Work  CSW received a message from [facility] reporting that they are denying patient.  CSW will continue to search for placement.  [place]olly [place], [place] [phone]

## 2013-11-27 NOTE — Progress Notes (Signed)
Clinical Social Work  [facility] at 8am. CSW spoke with patient about this plan. Patient reports she is concerned about transportation so CSW and patient spoke in detail about plans. Patient agreeable to contact sponsor Britta Mccreedy(Barbara) and if she is unable to take her then she will ask her boyfriend Alycia Rossetti(Ryan). Patient reports if neither of these can give her a ride then she will talk with friends from [employer] meetings. Patient agreeable to follow up with AA meetings until she is accepted into [facility]. Patient will return home to halfway house and feels she can remain sober. Patient reports she will take the bus home and requests a bus pass to get home.  CSW is signing off but available if needed.  [place], [place] [phone]
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
## 2013-11-29 ENCOUNTER — Ambulatory Visit: Payer: Self-pay | Admitting: Internal Medicine

## 2013-12-01 ENCOUNTER — Other Ambulatory Visit: Payer: Self-pay | Admitting: Physician Assistant

## 2013-12-16 ENCOUNTER — Encounter (HOSPITAL_COMMUNITY): Payer: Self-pay | Admitting: Emergency Medicine

## 2013-12-16 ENCOUNTER — Emergency Department (HOSPITAL_COMMUNITY)
Admission: EM | Admit: 2013-12-16 | Discharge: 2013-12-18 | Disposition: A | Discharge status: Home / Self Care | Payer: Medicare Other | Attending: Emergency Medicine | Admitting: Emergency Medicine

## 2013-12-16 DIAGNOSIS — T424X4A Poisoning by benzodiazepines, undetermined, initial encounter: Secondary | ICD-10-CM | POA: Insufficient documentation

## 2013-12-16 DIAGNOSIS — T43294A Poisoning by other antidepressants, undetermined, initial encounter: Secondary | ICD-10-CM | POA: Insufficient documentation

## 2013-12-16 DIAGNOSIS — F10230 Alcohol dependence with withdrawal, uncomplicated: Secondary | ICD-10-CM

## 2013-12-16 DIAGNOSIS — E119 Type 2 diabetes mellitus without complications: Secondary | ICD-10-CM | POA: Diagnosis not present

## 2013-12-16 DIAGNOSIS — F319 Bipolar disorder, unspecified: Secondary | ICD-10-CM | POA: Diagnosis not present

## 2013-12-16 DIAGNOSIS — T6592XA Toxic effect of unspecified substance, intentional self-harm, initial encounter: Secondary | ICD-10-CM | POA: Insufficient documentation

## 2013-12-16 DIAGNOSIS — F1310 Sedative, hypnotic or anxiolytic abuse, uncomplicated: Secondary | ICD-10-CM | POA: Diagnosis not present

## 2013-12-16 DIAGNOSIS — F1020 Alcohol dependence, uncomplicated: Secondary | ICD-10-CM | POA: Diagnosis present

## 2013-12-16 DIAGNOSIS — T438X2A Poisoning by other psychotropic drugs, intentional self-harm, initial encounter: Secondary | ICD-10-CM | POA: Insufficient documentation

## 2013-12-16 DIAGNOSIS — F3132 Bipolar disorder, current episode depressed, moderate: Secondary | ICD-10-CM

## 2013-12-16 DIAGNOSIS — F1910 Other psychoactive substance abuse, uncomplicated: Secondary | ICD-10-CM

## 2013-12-16 DIAGNOSIS — T510X4A Toxic effect of ethanol, undetermined, initial encounter: Secondary | ICD-10-CM | POA: Insufficient documentation

## 2013-12-16 DIAGNOSIS — E669 Obesity, unspecified: Secondary | ICD-10-CM | POA: Insufficient documentation

## 2013-12-16 DIAGNOSIS — Z79899 Other long term (current) drug therapy: Secondary | ICD-10-CM | POA: Diagnosis not present

## 2013-12-16 DIAGNOSIS — F411 Generalized anxiety disorder: Secondary | ICD-10-CM | POA: Diagnosis not present

## 2013-12-16 DIAGNOSIS — I10 Essential (primary) hypertension: Secondary | ICD-10-CM | POA: Diagnosis not present

## 2013-12-16 DIAGNOSIS — T50902A Poisoning by unspecified drugs, medicaments and biological substances, intentional self-harm, initial encounter: Secondary | ICD-10-CM

## 2013-12-16 DIAGNOSIS — E785 Hyperlipidemia, unspecified: Secondary | ICD-10-CM | POA: Diagnosis not present

## 2013-12-16 DIAGNOSIS — F322 Major depressive disorder, single episode, severe without psychotic features: Secondary | ICD-10-CM

## 2013-12-16 DIAGNOSIS — Z87891 Personal history of nicotine dependence: Secondary | ICD-10-CM | POA: Diagnosis not present

## 2013-12-16 DIAGNOSIS — T43502A Poisoning by unspecified antipsychotics and neuroleptics, intentional self-harm, initial encounter: Secondary | ICD-10-CM | POA: Insufficient documentation

## 2013-12-16 DIAGNOSIS — T50901A Poisoning by unspecified drugs, medicaments and biological substances, accidental (unintentional), initial encounter: Secondary | ICD-10-CM | POA: Diagnosis present

## 2013-12-16 HISTORY — DX: Suicide attempt, initial encounter: T14.91XA

## 2013-12-16 LAB — COMPREHENSIVE METABOLIC PANEL
ALK PHOS: 73 U/L (ref 39–117)
ALT: 7 U/L (ref 0–35)
AST: 15 U/L (ref 0–37)
Albumin: 3.2 g/dL — ABNORMAL LOW (ref 3.5–5.2)
Anion gap: 16 — ABNORMAL HIGH (ref 5–15)
BUN: 16 mg/dL (ref 6–23)
CO2: 23 meq/L (ref 19–32)
Calcium: 9 mg/dL (ref 8.4–10.5)
Chloride: 99 mEq/L (ref 96–112)
Creatinine, Ser: 0.81 mg/dL (ref 0.50–1.10)
GFR, EST NON AFRICAN AMERICAN: 81 mL/min — AB (ref >90)
GLUCOSE: 106 mg/dL — AB (ref 70–99)
Potassium: 4 mEq/L (ref 3.7–5.3)
SODIUM: 138 meq/L (ref 137–147)
TOTAL PROTEIN: 6.6 g/dL (ref 6.0–8.3)
Total Bilirubin: 0.2 mg/dL — ABNORMAL LOW (ref 0.3–1.2)

## 2013-12-16 LAB — RAPID URINE DRUG SCREEN, HOSP PERFORMED
Amphetamines: NOT DETECTED
BARBITURATES: POSITIVE — AB
Benzodiazepines: POSITIVE — AB
COCAINE: NOT DETECTED
Opiates: NOT DETECTED
Tetrahydrocannabinol: NOT DETECTED

## 2013-12-16 LAB — CBC
HCT: 33.8 % — ABNORMAL LOW (ref 36.0–46.0)
HEMOGLOBIN: 10.8 g/dL — AB (ref 12.0–15.0)
MCH: 26.9 pg (ref 26.0–34.0)
MCHC: 32 g/dL (ref 30.0–36.0)
MCV: 84.1 fL (ref 78.0–100.0)
PLATELETS: 204 10*3/uL (ref 150–400)
RBC: 4.02 MIL/uL (ref 3.87–5.11)
RDW: 15.4 % (ref 11.5–15.5)
WBC: 6.8 10*3/uL (ref 4.0–10.5)

## 2013-12-16 LAB — ACETAMINOPHEN LEVEL: Acetaminophen (Tylenol), Serum: <15 ug/mL (ref 10–30)

## 2013-12-16 LAB — SALICYLATE LEVEL: Salicylate Lvl: <2 mg/dL — ABNORMAL LOW (ref 2.8–20.0)

## 2013-12-16 LAB — ETHANOL: Alcohol, Ethyl (B): 163 mg/dL — ABNORMAL HIGH (ref 0–11)

## 2013-12-16 LAB — VALPROIC ACID LEVEL: Valproic Acid Lvl: 58.8 ug/mL (ref 50.0–100.0)

## 2013-12-16 MED ORDER — SODIUM CHLORIDE 0.9 % IV BOLUS (SEPSIS)
1000.0000 mL | Freq: Once | INTRAVENOUS | Status: AC
  Administered 2013-12-16: 1000 mL via INTRAVENOUS

## 2013-12-16 NOTE — ED Provider Notes (Addendum)
CSN: [id number]     Arrival date & time 12/16/13  2027 History   First MD Initiated Contact with Patient 12/16/13 2028     Chief Complaint  Patient presents with  . Ingestion     (Consider location/radiation/quality/duration/timing/severity/associated sxs/prior Treatment) HPI A LEVEL 5 CAVEAT PERTAINS DUE TO ALTERED MENTAL STATUS Pt presents after overdose of trazodone, klonopin and etoh.  She states the reason she took these was because her "life is falling apart".  She estimates she took between 10-20 of each of the pills.  She also states she drank 3, 40 ounce natural ices.  She denies nausea/vomiting.  Feels sleepy.    Past Medical History  Diagnosis Date  . Bipolar 1 disorder   . Obesity   . Diabetes mellitus   . Hypertension   . Vomiting   . Substance abuse     alcohol   . Type II or unspecified type diabetes mellitus without mention of complication, not stated as uncontrolled   . Depression   . Hyperlipidemia   . Anxiety   . Suicide attempt     pt reports multiple suicide attempts.   Past Surgical History  Procedure Laterality Date  . Tonsillectomy    . Laparoscopic gastric banding  06/11/09  . Vaginal delivery  1987   Family History  Problem Relation Age of Onset  . Osteoarthritis Mother   . Heart failure Father   . Osteoarthritis Father   . Alcohol abuse Father    History  Substance Use Topics  . Smoking status: Former Smoker    Types: Cigarettes  . Smokeless tobacco: Never Used  . Alcohol Use: 0.0 oz/week     Comment: denies present use   OB History   Grav Para Term Preterm Abortions TAB SAB Ect Mult Living                 Review of Systems UNABLE TO OBTAIN ROS DUE TO LEVEL 5 CAVEAT    Allergies  Lithium; Risperidone; Ritalin; and Valproic acid and related  Home Medications   Prior to Admission medications   Medication Sig Start Date End Date Taking? Authorizing Provider  amLODipine (NORVASC) 5 MG tablet Take 10 mg by mouth daily. 11/30/13   Yes Historical Provider, MD  divalproex (DEPAKOTE ER) 250 MG 24 hr tablet Takes  in the morning and  at bedtime 11/27/13  Yes Vassie Loll, MD  gabapentin (NEURONTIN) 300 MG capsule Take 300 mg by mouth 3 (three) times daily.   Yes Historical Provider, MD  hydrOXYzine (VISTARIL) 25 MG capsule Take 25 mg by mouth daily after lunch. 10/23/13  Yes Historical Provider, MD  metFORMIN (GLUCOPHAGE) 500 MG tablet Take 1 tablet (500 mg total) by mouth 2 (two) times daily with a meal. For diabetes 10/12/13  Yes Sanjuana Kava, NP  metoprolol tartrate (LOPRESSOR) 25 MG tablet Take 1 tablet (25 mg total) by mouth 2 (two) times daily. For hypertension 10/12/13  Yes Sanjuana Kava, NP  pravastatin (PRAVACHOL) 20 MG tablet Take 1 tablet (20 mg total) by mouth every evening. For high cholesterol 10/12/13  Yes Sanjuana Kava, NP  traZODone (DESYREL) 100 MG tablet Take 100 mg by mouth at bedtime.   Yes Historical Provider, MD  FLUoxetine (PROZAC) 20 MG capsule Take 20 mg by mouth daily. 10/23/13   Historical Provider, MD   BP 133/73  Pulse 57  Temp(Src) 98.8 F (37.1 C) (Oral)  Resp 17  Ht  (1.626 m)  Wt 200  lb (90.719 kg)  BMI 34.31 kg/m2  SpO2 92%  LMP 11/21/2012 Vitals reviewed Physical Exam Physical Examination: General appearance - alert, sleepy appearing, and in no distress Mental status - alert, oriented to person, place, and time Eyes - pupils equal and reactive, extraocular eye movements intact Mouth - mucous membranes moist, pharynx normal without lesions Chest - clear to auscultation, no wheezes, rales or rhonchi, symmetric air entry Heart - normal rate, regular rhythm, normal S1, S2, no murmurs, rubs, clicks or gallops Abdomen - soft, nontender, nondistended, no masses or organomegaly Neurological - alert, orientedx 3, sleepy appearing, cranial nerves grossly intact, strength 5/5 in extremities x 4, sensation intact Extremities - peripheral pulses normal, no pedal edema, no clubbing or  cyanosis Skin - normal coloration and turgor, no rashes Psych- tearful, flat affect  ED Course  Procedures (including critical care time) Labs Review Labs Reviewed  CBC - Abnormal; Notable for the following:    Hemoglobin 10.8 (*)    HCT 33.8 (*)    All other components within normal limits  COMPREHENSIVE METABOLIC PANEL - Abnormal; Notable for the following:    Glucose, Bld 106 (*)    Albumin 3.2 (*)    Total Bilirubin 0.2 (*)    GFR calc non Af Amer 81 (*)    Anion gap 16 (*)    All other components within normal limits  ETHANOL - Abnormal; Notable for the following:    Alcohol, Ethyl (B) 163 (*)    All other components within normal limits  SALICYLATE LEVEL - Abnormal; Notable for the following:    Salicylate Lvl <2.0 (*)    All other components within normal limits  URINE RAPID DRUG SCREEN (HOSP PERFORMED) - Abnormal; Notable for the following:    Benzodiazepines POSITIVE (*)    Barbiturates POSITIVE (*)    All other components within normal limits  ACETAMINOPHEN LEVEL  VALPROIC ACID LEVEL    Imaging Review No results found.   EKG Interpretation   Date/Time:  Saturday December 16 2013 20:31:46 EDT Ventricular Rate:  62 PR Interval:  169 QRS Duration: 99 QT Interval:  482 QTC Calculation: 489 R Axis:   0 Text Interpretation:  Sinus rhythm Borderline prolonged QT interval No  significant change since last tracing Confirmed by [facility]  MD, Cypher Paule  [phone]) on 12/16/2013 10:21:12 PM      MDM   Final diagnoses:  Overdose, intentional self-harm, initial encounter    Pt presenting after intentional OD on klonopin and trazadone, etoh.  She states this was a suicide attempt.  Based on the amounts and meds she ingested, d/w poison control and they recommended 6 hours observation.  EKG reassuring, labs reassuring.  Pt placed on monitor, IV access, IV fluids.  After period of observation patient will need psych evaluation.  Pt signed out to Dr. Judd Lien for reassessment at  2:30am when 6 hour period of observation is over.      Ethelda Chick, MD 12/16/13 2346  Ethelda Chick, MD 12/16/13 [phone]

## 2013-12-16 NOTE — ED Notes (Signed)
Large black and white bag Accucheck CBG meter Blue/white slip on shoes Shirt  [place]ri sweatpants Headphones Comb Black wallet with multiple papers and business cards ! mastercard [employer]
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
## 2013-12-16 NOTE — ED Notes (Signed)
Sitter at bedside, pt advised she is unable to eat at this time d/t AMS. Pt continues to have slurred speech.

## 2013-12-16 NOTE — ED Notes (Signed)
Bed: ZO10 Expected date: 12/16/13 Expected time: 8:12 PM Means of arrival: Ambulance Comments: Bed 16, EMS, 2 F, Overdose Klonopin
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
## 2013-12-17 ENCOUNTER — Encounter (HOSPITAL_COMMUNITY): Payer: Self-pay | Admitting: Registered Nurse

## 2013-12-17 ENCOUNTER — Emergency Department (HOSPITAL_COMMUNITY): Payer: Medicare Other

## 2013-12-17 DIAGNOSIS — F1994 Other psychoactive substance use, unspecified with psychoactive substance-induced mood disorder: Secondary | ICD-10-CM

## 2013-12-17 DIAGNOSIS — R45851 Suicidal ideations: Secondary | ICD-10-CM

## 2013-12-17 DIAGNOSIS — F1910 Other psychoactive substance abuse, uncomplicated: Secondary | ICD-10-CM

## 2013-12-17 DIAGNOSIS — F332 Major depressive disorder, recurrent severe without psychotic features: Secondary | ICD-10-CM

## 2013-12-17 DIAGNOSIS — F1010 Alcohol abuse, uncomplicated: Secondary | ICD-10-CM

## 2013-12-17 LAB — CBC WITH DIFFERENTIAL/PLATELET
Basophils Absolute: 0 10*3/uL (ref 0.0–0.1)
Basophils Relative: 1 % (ref 0–1)
EOS PCT: 7 % — AB (ref 0–5)
Eosinophils Absolute: 0.4 10*3/uL (ref 0.0–0.7)
HCT: 34.5 % — ABNORMAL LOW (ref 36.0–46.0)
Hemoglobin: 11.3 g/dL — ABNORMAL LOW (ref 12.0–15.0)
LYMPHS ABS: 2.2 10*3/uL (ref 0.7–4.0)
LYMPHS PCT: 42 % (ref 12–46)
MCH: 27.2 pg (ref 26.0–34.0)
MCHC: 32.8 g/dL (ref 30.0–36.0)
MCV: 82.9 fL (ref 78.0–100.0)
Monocytes Absolute: 0.5 10*3/uL (ref 0.1–1.0)
Monocytes Relative: 10 % (ref 3–12)
NEUTROS PCT: 40 % — AB (ref 43–77)
Neutro Abs: 2.1 10*3/uL (ref 1.7–7.7)
Platelets: 220 10*3/uL (ref 150–400)
RBC: 4.16 MIL/uL (ref 3.87–5.11)
RDW: 15.4 % (ref 11.5–15.5)
WBC: 5.2 10*3/uL (ref 4.0–10.5)

## 2013-12-17 LAB — COMPREHENSIVE METABOLIC PANEL
ALK PHOS: 73 U/L (ref 39–117)
ALT: 8 U/L (ref 0–35)
AST: 15 U/L (ref 0–37)
Albumin: 3.1 g/dL — ABNORMAL LOW (ref 3.5–5.2)
Anion gap: 11 (ref 5–15)
BUN: 21 mg/dL (ref 6–23)
CHLORIDE: 98 meq/L (ref 96–112)
CO2: 27 meq/L (ref 19–32)
Calcium: 9.1 mg/dL (ref 8.4–10.5)
Creatinine, Ser: 0.95 mg/dL (ref 0.50–1.10)
GFR calc Af Amer: 78 mL/min — ABNORMAL LOW (ref >90)
GFR, EST NON AFRICAN AMERICAN: 67 mL/min — AB (ref >90)
GLUCOSE: 210 mg/dL — AB (ref 70–99)
POTASSIUM: 4.8 meq/L (ref 3.7–5.3)
SODIUM: 136 meq/L — AB (ref 137–147)
Total Bilirubin: 0.3 mg/dL (ref 0.3–1.2)
Total Protein: 6.4 g/dL (ref 6.0–8.3)

## 2013-12-17 LAB — CBG MONITORING, ED: Glucose-Capillary: 128 mg/dL — ABNORMAL HIGH (ref 70–99)

## 2013-12-17 LAB — LIPASE, BLOOD: Lipase: 47 U/L (ref 11–59)

## 2013-12-17 MED ORDER — DIVALPROEX SODIUM ER 500 MG PO TB24
500.0000 mg | ORAL_TABLET | Freq: Every day | ORAL | Status: DC
  Administered 2013-12-17 – 2013-12-18 (×2): 500 mg via ORAL
  Filled 2013-12-17 (×2): qty 1

## 2013-12-17 MED ORDER — TRAZODONE HCL 100 MG PO TABS
100.0000 mg | ORAL_TABLET | Freq: Every day | ORAL | Status: DC
  Administered 2013-12-17: 100 mg via ORAL
  Filled 2013-12-17: qty 1

## 2013-12-17 MED ORDER — DIVALPROEX SODIUM ER 500 MG PO TB24
750.0000 mg | ORAL_TABLET | Freq: Every day | ORAL | Status: DC
  Administered 2013-12-17: 750 mg via ORAL
  Filled 2013-12-17 (×2): qty 1

## 2013-12-17 MED ORDER — HYDROXYZINE PAMOATE 25 MG PO CAPS
25.0000 mg | ORAL_CAPSULE | Freq: Every day | ORAL | Status: DC
  Filled 2013-12-17: qty 1

## 2013-12-17 MED ORDER — METOPROLOL TARTRATE 25 MG PO TABS
25.0000 mg | ORAL_TABLET | Freq: Two times a day (BID) | ORAL | Status: DC
  Administered 2013-12-17 – 2013-12-18 (×2): 25 mg via ORAL
  Filled 2013-12-17 (×2): qty 1

## 2013-12-17 MED ORDER — ENSURE COMPLETE PO LIQD
237.0000 mL | Freq: Two times a day (BID) | ORAL | Status: DC
  Administered 2013-12-17 – 2013-12-18 (×3): 237 mL via ORAL
  Filled 2013-12-17 (×5): qty 237

## 2013-12-17 MED ORDER — AMLODIPINE BESYLATE 10 MG PO TABS
10.0000 mg | ORAL_TABLET | Freq: Every day | ORAL | Status: DC
  Administered 2013-12-17 – 2013-12-18 (×2): 10 mg via ORAL
  Filled 2013-12-17 (×2): qty 1

## 2013-12-17 MED ORDER — FLUOXETINE HCL 20 MG PO CAPS
20.0000 mg | ORAL_CAPSULE | Freq: Every day | ORAL | Status: DC
  Administered 2013-12-17 – 2013-12-18 (×2): 20 mg via ORAL
  Filled 2013-12-17 (×2): qty 1

## 2013-12-17 MED ORDER — SIMVASTATIN 10 MG PO TABS
10.0000 mg | ORAL_TABLET | Freq: Every day | ORAL | Status: DC
  Administered 2013-12-17: 10 mg via ORAL
  Filled 2013-12-17 (×2): qty 1

## 2013-12-17 MED ORDER — METFORMIN HCL 500 MG PO TABS
500.0000 mg | ORAL_TABLET | Freq: Two times a day (BID) | ORAL | Status: DC
  Administered 2013-12-17: 500 mg via ORAL
  Filled 2013-12-17 (×4): qty 1

## 2013-12-17 MED ORDER — GABAPENTIN 300 MG PO CAPS
300.0000 mg | ORAL_CAPSULE | Freq: Three times a day (TID) | ORAL | Status: DC
  Administered 2013-12-17 – 2013-12-18 (×3): 300 mg via ORAL
  Filled 2013-12-17 (×3): qty 1

## 2013-12-17 MED ORDER — IOHEXOL 300 MG/ML  SOLN
100.0000 mL | Freq: Once | INTRAMUSCULAR | Status: AC | PRN
  Administered 2013-12-17: 100 mL via INTRAVENOUS

## 2013-12-17 MED ORDER — IOHEXOL 300 MG/ML  SOLN
50.0000 mL | Freq: Once | INTRAMUSCULAR | Status: AC | PRN
  Administered 2013-12-17: 50 mL via ORAL

## 2013-12-17 NOTE — BH Assessment (Signed)
Assessment completed. Consulted Maryjean Morn, PA-C who recommended inpatient treatment. [facility] at capacity. TTS will contact other facilities for placement. Dr. Judd Lien is aware of recommendation.

## 2013-12-17 NOTE — ED Notes (Addendum)
Pt now c/o of sharp/stabbing abd pain that she has been having for the past 3 days, pain is constant, mid abd, no vomiting other than food associated, no blood noted in stool/vomit.  Pt ate approx 60% ofsupper.

## 2013-12-17 NOTE — Progress Notes (Signed)
F/u calls placed to the following facilities regarding referral: [facility]- per Deedra currently being reviewed, will call TTS once complete [facility]- per Victorino Dike at capacity  Pt's referral has been faxed to the following facilities: [facility]- per Merita Norton having some d/c's and also have wait list [facility]- per Darreld Mclean can fax [facility]- per Deanna Artis can fax currently reviewing [facility]- per Selena Batten accepting referrals [facility]- per Sue Lush some adult beds available [facility]  The following facilities also contacted are at capacity: [facility]- per Camille Bal- per Levie Heritage- per [facility]- per Elmore Guise- per [facility]- per [facility]- per [facility]- per Dionicio Stall- per Darl Pikes  [facility]- per Jerrye Noble- per Arlys John  [facility]  [facility]   [facility] Disposition [facility]

## 2013-12-17 NOTE — ED Notes (Signed)
On the  Phone at the desk

## 2013-12-17 NOTE — ED Notes (Signed)
Pt. Up to nurses station to c/o generalized discomfort and some anxiety. Treatment options discussed with pt. Ginger ale given. Pt. informed of ramifications of IVC and overdose of prescribed medications. Pt. Now less agitated

## 2013-12-17 NOTE — ED Notes (Signed)
Friend into see 

## 2013-12-17 NOTE — ED Notes (Signed)
Pt up to the desk and reports that she has a lap band and that it "closes up" in the morning after she lays down at night.  Pt reports that she is only able to eat soft foods/fluids in the morning and needs to walk around.  PO fluids given, pt is aware that she must stay in her hall.

## 2013-12-17 NOTE — ED Notes (Addendum)
Patient lying in bed presents calm and cooperative, denies any current pain, states that the pain comes and goes in her lower abdomen; denies SI/HI/AVH. NAD

## 2013-12-17 NOTE — ED Notes (Signed)
Dr ray into see

## 2013-12-17 NOTE — ED Notes (Signed)
Poison control called. Update provided.  They are satisfied with her lab results and VS. They are going to close out her file.
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
## 2013-12-17 NOTE — ED Notes (Signed)
Up to the bathroom 

## 2013-12-17 NOTE — ED Notes (Signed)
Nad, eatting ice cream, watching tv
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
## 2013-12-17 NOTE — ED Notes (Signed)
Per report pt is ivc.  Unable to verify pt is under IVC, no papers found in log/chart, secretary/charge nurse unaware of ivc, Alex CSW contacted and w/ follow up.

## 2013-12-17 NOTE — ED Provider Notes (Signed)
53 year old female who is here after an ingestion. She is being evaluated by psychiatry. She has complained of abdominal pain been present for about 3 days. She states that this occurred with ingestion of alcohol. She states that she has had this pain before with alcohol ingestion. She states it began when she began drinking about 3 days ago. She states that it has continued it is diffuse in nature. She has had some nausea and has vomited 2 times. There is very little in the emesis. She denies any blood being present in it. The pain is getting somewhat better. She has not had any fever or chills.  Physical exam Well-developed well-nourished female does not appear to be in any acute distress. Lungs are clear to auscultation Heart is regular rate and rhythm Abdomen is soft with some mild diffuse tenderness palpation including the right lower quadrant. A lap band is palpable in the right upper quadrant.  Plan we'll obtain CT scan of abdomen and repeat labs including lipase.  Ct Abdomen Pelvis W Contrast  12/17/2013   CLINICAL DATA:  Short mid abdominal pain x3 days, lap band  EXAM: CT ABDOMEN AND PELVIS WITH CONTRAST  TECHNIQUE: Multidetector CT imaging of the abdomen and pelvis was performed using the standard protocol following bolus administration of intravenous contrast.  CONTRAST:  50mL OMNIPAQUE IOHEXOL 300 MG/ML SOLN, OMNIPAQUE IOHEXOL 300 MG/ML SOLN  COMPARISON:  None.  FINDINGS: Lung bases are clear.  Laparoscopic band in satisfactory position. Normal orientation on the topogram.  Liver, spleen, pancreas, and adrenal glands are within normal limits.  Cholelithiasis, without associated inflammatory changes.  Kidneys are within normal limits. No hydronephrosis. No evidence of bowel obstruction. Normal appendix. Moderate right colonic stool burden.  Atherosclerotic calcifications of the abdominal aorta and branch vessels.  No abdominopelvic ascites.  No suspicious abdominopelvic lymphadenopathy.   Uterus is unremarkable.  No adnexal masses.  Bladder is within normal limits.  Visualized osseous structures are within normal limits.  IMPRESSION: No evidence of bowel obstruction.  Normal appendix.  Moderate colonic stool burden, raising possibility of constipation.  Laparoscopic band in satisfactory position.  Cholelithiasis, without associated inflammatory changes.   Electronically Signed   By: Charline Bills M.D.   On: 12/17/2013 21:57    Labs normal except hyperglycemia and ct abdomen without evidence of acute process.   Hilario Quarry, MD 12/17/13 2217

## 2013-12-17 NOTE — ED Notes (Signed)
Pt. To [facility] from ED ambulatory without difficulty. Report from [facility]. Pt. Is alert and oriented, warm and dry in no distress. Pt. Denies SI, HI, and AVH. Pt. Calm and cooperative. Pt. Encouraged to let Nursing staff know if he has concerns or needs.

## 2013-12-17 NOTE — ED Notes (Signed)
Pt up to the desk crying at times, demanding that she be told where she is going before she is transferred. "Nobody will pick me up..."  Pt assurred that she will be told before she is transferred.

## 2013-12-17 NOTE — BH Assessment (Signed)
Binnie Rail, [facility], confirmed adult unit is currently at capacity. Contacted the following facilities for placement:  BED AVAILABLE, FAXED CLINICAL INFORMATION: [facility], per [facility], per Karie Mainland  AT CAPACITY: Yvetta Coder, per [facility], per Cherie Ouch, per [facility], per [facility], per [facility], per [facility], per [facility], per [facility], per [facility] per [facility], per Jonetta Osgood, per [employer], per Lenice Pressman, per [employer]  MULTIPLE ATTEMPTS TO CONTACT WITH NO RESPONSE: [facility]   [address] [place], [place], [employer] Triage Specialist [phone]

## 2013-12-17 NOTE — ED Notes (Signed)
Dr Ross and Shuvon NP into see 

## 2013-12-17 NOTE — ED Notes (Signed)
Dr Rosalia Hammers updated and will see.

## 2013-12-18 ENCOUNTER — Encounter (HOSPITAL_COMMUNITY): Payer: Self-pay | Admitting: Psychiatry

## 2013-12-18 LAB — URINALYSIS, ROUTINE W REFLEX MICROSCOPIC
Bilirubin Urine: NEGATIVE
GLUCOSE, UA: NEGATIVE mg/dL
HGB URINE DIPSTICK: NEGATIVE
KETONES UR: NEGATIVE mg/dL
Leukocytes, UA: NEGATIVE
Nitrite: NEGATIVE
Protein, ur: NEGATIVE mg/dL
Specific Gravity, Urine: 1.031 — ABNORMAL HIGH (ref 1.005–1.030)
Urobilinogen, UA: 0.2 mg/dL (ref 0.0–1.0)
pH: 6 (ref 5.0–8.0)

## 2013-12-18 NOTE — ED Notes (Signed)
Report received from. Pt. Sleeping with respirations even and unlabored. Will continue to monitor for safety. Q 15 minute checks continue.

## 2013-12-18 NOTE — BH Assessment (Signed)
Per Binnie Rail, [facility], adult unit is currently at capacity. Contacted the following facilities for placement:  CURRENTLY AT CAPACITY: [facility], [facility], per Earl Lites, per [facility], per [facility], per Glorious Peach, per [facility], per [facility], Per [facility], per [facility], per [facility], per [facility], per [facility], per [facility], per Kennieth Rad, per [facility], per Claudine  INFORMATION HAS BEEN SENT TO THESE FACILITIES FOR WAIT LIST: [facility]   [address] [place], [place], [facility] Triage Specialist [phone]

## 2013-12-18 NOTE — ED Notes (Signed)
Pt. Alert and oriented in no distress denies SI, HI, AVH and pain. Will continue to monitor for safety. Pt. Instructed to come to me with problems or concerns. Q 15 minute checks continue. 
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
## 2013-12-18 NOTE — ED Notes (Signed)
Pt. Discharged after instructions ambulatory without c/o.

## 2013-12-18 NOTE — Progress Notes (Signed)
  CARE MANAGEMENT ED NOTE 12/18/2013  Patient:  KYNLEY, METZGER   Account Number:  [id number]  Date Initiated:  12/18/2013  Documentation initiated by:  Edd Arbour  Subjective/Objective Assessment:   53 yr old united health care medicare aarp medicare complete pt reports overdosing on trazodone, klonopin and epth Positive UDS for Benzo, Barbiturates etoh level 163 with 3 [facility] ED visits and 3 admissions in last 6 months     Subjective/Objective Assessment Detail:   Last hospitalization at [facility] on 10/06/13 ([address])    Listed with Dr Oneta Rack as pcp but Cm noted a note in EPIC indicating that Dr Oneta Rack has d/c pt from his services for 4 no shows Pt is aware of this when CM spoke with her  Agreed to other providers lists [facility]  Pt inquired about d/c Referred her to TTS/BH MD/RN     Action/Plan:   ED CM provided pt with a list of in network providers as groups and as individual providers for [place] code [id number] for united health care aarp complete   Action/Plan Detail:   Anticipated DC Date:  12/18/2013     Status Recommendation to Physician:   Result of Recommendation:    Other ED Services  Consult Working Plan    DC Planning Services  Other  PCP issues  Outpatient Services - Pt will follow up    Choice offered to / List presented to:            Status of service:  Completed, signed off  ED Comments:   ED Comments Detail:

## 2013-12-18 NOTE — BHH Suicide Risk Assessment (Signed)
Suicide Risk Assessment  Discharge Assessment     Demographic Factors:  Caucasian  Total Time spent with patient: 20 minutes  Psychiatric Specialty Exam:     Blood pressure 117/69, pulse 63, temperature 97.9 F (36.6 C), temperature source Oral, resp. rate 18, height  (1.626 m), weight 200 lb (90.719 kg), last menstrual period 11/21/2012, SpO2 97.00%.Body mass index is 34.31 kg/(m^2).  General Appearance: Causl  Eye Contact::  Good  Speech:  Clear and Coherent and Normal Rate  Volume:  Normal  Mood:  Depressed  Affect:  Congruent  Thought Process:  Circumstantial and Goal Directed  Orientation:  Full (Time, Place, and Person)  Thought Content:  Clear, coherent  Suicidal Thoughts:  No  Homicidal Thoughts:  No  Memory:  Immediate;   Good Recent;   Good Remote;   Good  Judgement:  Fair  Insight:  Fair  Psychomotor Activity:  Normal  Concentration:  Good  Recall:  Good  Fund of Knowledge:Good  Language: Good  Akathisia:  No  Handed:  Right  AIMS (if indicated):     Assets:  Communication Skills Desire for Improvement  Sleep:      Musculoskeletal: Strength & Muscle Tone: within normal limits Gait & Station: normal Patient leans: N/A  Mental Status Per Nursing Assessment::   On Admission:   Overdose  Current Mental Status by Physician: NA  Loss Factors: NA  Historical Factors: Impulsivity  Risk Reduction Factors:   Sense of responsibility to family, Living with another person, especially a relative, Positive social support, Positive therapeutic relationship and Positive coping skills or problem solving skills  Continued Clinical Symptoms:  Anxiety   Cognitive Features That Contribute To Risk:  None   Suicide Risk:  Minimal: No identifiable suicidal ideation.  Patients presenting with no risk factors but with morbid ruminations; may be classified as minimal risk based on the severity of the depressive symptoms  Discharge Diagnoses:   AXIS I:   Alcohol Abuse and Bipolar, Depressed AXIS II:  Deferred AXIS III:   Past Medical History  Diagnosis Date  . Bipolar 1 disorder   . Obesity   . Diabetes mellitus   . Hypertension   . Vomiting   . Substance abuse     alcohol   . Type II or unspecified type diabetes mellitus without mention of complication, not stated as uncontrolled   . Depression   . Hyperlipidemia   . Anxiety   . Suicide attempt     pt reports multiple suicide attempts.   AXIS IV:  other psychosocial or environmental problems, problems related to social environment and problems with primary support group AXIS V:  61-70 mild symptoms  Plan Of Care/Follow-up recommendations:  Activity:  as tolerated Diet:  low-sodium heart healthy diet  Is patient on multiple antipsychotic therapies at discharge:  No   Has Patient had three or more failed trials of antipsychotic monotherapy by history:  No  Recommended Plan for Multiple Antipsychotic Therapies: NA    Eular Panek, PMh-NP 12/18/2013, 2:30 PM

## 2013-12-18 NOTE — BH Assessment (Signed)
BHH Assessment Progress Note  Pt seen this day by Dr. Jannifer Franklin, Nanine Means, NP, and this clinician.  Pt denies SI/HI/psychosis and will be discharged home.  Pt signed a No Harm contract and was given referral to [facility] outpatient clinic to follow up.  She is to make a follow up appt tomorrow.  Called pt's boyfriend, Alycia Rossetti at [phone], who stated he felt fine with pt returning home and he would arrive at [facility] between before 12 PM to pick pt up at discharge.  Casimer Lanius, MS, [facility] Licensed Professional Counselor Triage Specialist

## 2013-12-19 ENCOUNTER — Other Ambulatory Visit: Payer: Self-pay | Admitting: Internal Medicine

## 2013-12-29 ENCOUNTER — Emergency Department (HOSPITAL_COMMUNITY)
Admission: EM | Admit: 2013-12-29 | Discharge: 2013-12-29 | Disposition: A | Discharge status: Home / Self Care | Payer: Medicare Other | Attending: Emergency Medicine | Admitting: Emergency Medicine

## 2013-12-29 ENCOUNTER — Encounter (HOSPITAL_COMMUNITY): Payer: Self-pay | Admitting: Emergency Medicine

## 2013-12-29 DIAGNOSIS — F3175 Bipolar disorder, in partial remission, most recent episode depressed: Secondary | ICD-10-CM | POA: Insufficient documentation

## 2013-12-29 DIAGNOSIS — IMO0002 Reserved for concepts with insufficient information to code with codable children: Secondary | ICD-10-CM | POA: Insufficient documentation

## 2013-12-29 DIAGNOSIS — I10 Essential (primary) hypertension: Secondary | ICD-10-CM | POA: Insufficient documentation

## 2013-12-29 DIAGNOSIS — E785 Hyperlipidemia, unspecified: Secondary | ICD-10-CM | POA: Diagnosis not present

## 2013-12-29 DIAGNOSIS — Z87891 Personal history of nicotine dependence: Secondary | ICD-10-CM | POA: Insufficient documentation

## 2013-12-29 DIAGNOSIS — R45851 Suicidal ideations: Secondary | ICD-10-CM | POA: Diagnosis present

## 2013-12-29 DIAGNOSIS — F411 Generalized anxiety disorder: Secondary | ICD-10-CM | POA: Insufficient documentation

## 2013-12-29 DIAGNOSIS — Z79899 Other long term (current) drug therapy: Secondary | ICD-10-CM | POA: Insufficient documentation

## 2013-12-29 DIAGNOSIS — F1910 Other psychoactive substance abuse, uncomplicated: Secondary | ICD-10-CM

## 2013-12-29 DIAGNOSIS — F1310 Sedative, hypnotic or anxiolytic abuse, uncomplicated: Secondary | ICD-10-CM | POA: Insufficient documentation

## 2013-12-29 DIAGNOSIS — R451 Restlessness and agitation: Secondary | ICD-10-CM

## 2013-12-29 DIAGNOSIS — E119 Type 2 diabetes mellitus without complications: Secondary | ICD-10-CM | POA: Insufficient documentation

## 2013-12-29 DIAGNOSIS — E669 Obesity, unspecified: Secondary | ICD-10-CM | POA: Insufficient documentation

## 2013-12-29 DIAGNOSIS — F3130 Bipolar disorder, current episode depressed, mild or moderate severity, unspecified: Secondary | ICD-10-CM

## 2013-12-29 DIAGNOSIS — F319 Bipolar disorder, unspecified: Secondary | ICD-10-CM

## 2013-12-29 LAB — BASIC METABOLIC PANEL
Anion gap: 12 (ref 5–15)
BUN: 16 mg/dL (ref 6–23)
CO2: 28 meq/L (ref 19–32)
CREATININE: 1.06 mg/dL (ref 0.50–1.10)
Calcium: 9.4 mg/dL (ref 8.4–10.5)
Chloride: 103 mEq/L (ref 96–112)
GFR calc Af Amer: 68 mL/min — ABNORMAL LOW (ref >90)
GFR calc non Af Amer: 59 mL/min — ABNORMAL LOW (ref >90)
Glucose, Bld: 112 mg/dL — ABNORMAL HIGH (ref 70–99)
Potassium: 4.1 mEq/L (ref 3.7–5.3)
Sodium: 143 mEq/L (ref 137–147)

## 2013-12-29 LAB — RAPID URINE DRUG SCREEN, HOSP PERFORMED
AMPHETAMINES: NOT DETECTED
BARBITURATES: POSITIVE — AB
BENZODIAZEPINES: POSITIVE — AB
Cocaine: NOT DETECTED
Opiates: NOT DETECTED
TETRAHYDROCANNABINOL: NOT DETECTED

## 2013-12-29 LAB — CBC WITH DIFFERENTIAL/PLATELET
BASOS ABS: 0 10*3/uL (ref 0.0–0.1)
Basophils Relative: 1 % (ref 0–1)
Eosinophils Absolute: 0.5 10*3/uL (ref 0.0–0.7)
Eosinophils Relative: 8 % — ABNORMAL HIGH (ref 0–5)
HCT: 36.6 % (ref 36.0–46.0)
HEMOGLOBIN: 11.9 g/dL — AB (ref 12.0–15.0)
LYMPHS PCT: 41 % (ref 12–46)
Lymphs Abs: 2.7 10*3/uL (ref 0.7–4.0)
MCH: 27.5 pg (ref 26.0–34.0)
MCHC: 32.5 g/dL (ref 30.0–36.0)
MCV: 84.5 fL (ref 78.0–100.0)
MONO ABS: 0.7 10*3/uL (ref 0.1–1.0)
Monocytes Relative: 11 % (ref 3–12)
Neutro Abs: 2.5 10*3/uL (ref 1.7–7.7)
Neutrophils Relative %: 39 % — ABNORMAL LOW (ref 43–77)
Platelets: 259 10*3/uL (ref 150–400)
RBC: 4.33 MIL/uL (ref 3.87–5.11)
RDW: 15 % (ref 11.5–15.5)
WBC: 6.4 10*3/uL (ref 4.0–10.5)

## 2013-12-29 LAB — CBG MONITORING, ED: GLUCOSE-CAPILLARY: 104 mg/dL — AB (ref 70–99)

## 2013-12-29 LAB — ETHANOL

## 2013-12-29 MED ORDER — NICOTINE 21 MG/24HR TD PT24
21.0000 mg | MEDICATED_PATCH | Freq: Every day | TRANSDERMAL | Status: DC | PRN
Start: 2013-12-29 — End: 2013-12-29

## 2013-12-29 MED ORDER — IBUPROFEN 200 MG PO TABS: 600.0000 mg | ORAL_TABLET | Freq: Three times a day (TID) | ORAL | Status: DC | PRN

## 2013-12-29 MED ORDER — ZOLPIDEM TARTRATE 5 MG PO TABS: 5.0000 mg | ORAL_TABLET | Freq: Every evening | ORAL | Status: DC | PRN

## 2013-12-29 MED ORDER — ACETAMINOPHEN 325 MG PO TABS: 650.0000 mg | ORAL_TABLET | ORAL | Status: DC | PRN

## 2013-12-29 MED ORDER — ONDANSETRON HCL 4 MG PO TABS: 4.0000 mg | ORAL_TABLET | Freq: Three times a day (TID) | ORAL | Status: DC | PRN

## 2013-12-29 MED ORDER — ALUM & MAG HYDROXIDE-SIMETH 200-200-20 MG/5ML PO SUSP: 30.0000 mL | ORAL | Status: DC | PRN
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
## 2013-12-29 NOTE — Discharge Instructions (Signed)
Borderline Personality Disorder °Borderline personality disorder is a mental health disorder. People with borderline personality disorder have unhealthy patterns of perceiving, thinking about, and reacting to their environment and events in their life. These patterns are established by adolescence or early adulthood. People with borderline personality disorder also have difficulty coping with stress on their own and fear being abandoned by others. They have difficulty controlling their emotions. Their emotions change quickly, frequently, and intensely. They are easily upset and can become very angry, very suddenly. Their unpredictable behavior often leads to problems in their relationships. They often feel worthless, unloved, and emotionally empty. °CAUSES °No one knows the exact cause of borderline personality disorder. Most mental health experts think that there is more than one cause. Possible contributing factors include: °· Genetic factors. These are traits that are passed down from one generation to the next. Many people with borderline personality disorder have a family history of the disorder. °· Physical factors. The part of the brain that controls emotion may be different in people who have borderline personality disorder. °· Social factors. Traumatic experiences involving other people may play a role in the development of borderline personality disorder. Examples include neglect, abandonment, and physical and sexual abuse. °SYMPTOMS °Signs and symptoms of borderline personality disorder include: °· A series of unstable personal relationships. °· A strong fear of being abandoned and frantic efforts to avoid abandonment. °· Impulsive, self-destructive behavior, such as substance abuse, irrational spending of money, unprotected sex with multiple partners, reckless driving, and binge eating. °· Poor self-image that also may change a lot, or a sense of identity that is inconsistent. °· Recurring self-injury or  attempted suicide. °· Severe mood swings, including depression, irritability, and anxiety. °· Lasting feelings of emptiness. °· Difficulty controlling anger. °· Temporary feelings of paranoia or loss of touch with reality. °DIAGNOSIS °A diagnosis of borderline personality disorder requires the presence of at least 5 of the common signs and symptoms. This information is gathered from family and friends as well as medical professionals and legal professionals who have a close association with the patient. This information is also gathered during a psychiatric assessment. During the assessment, the patient is asked about early life experiences, level of education, employment status, physical health conditions, and current prescription and over-the-counter medicines used. °TREATMENT °Caregivers who usually treat borderline personality disorder are mental health professionals, such as psychologists, psychiatrists, and clinical social workers. More than one type of treatment may be needed. Types of treatment include: °· Psychotherapy (also known as talk therapy or counseling). °· Cognitive behavioral therapy. This helps the person to recognize and change unhealthy feelings, thoughts, and behaviors. They find new, more positive thoughts and actions to replace the old ones. °· Dialectical behavioral therapy. Through this type of treatment, a person learns to understand his or her feelings and to regulate them. This may be one-on-one treatment or part of group therapy. °· Family therapy. This treatment includes family members. °· Medicine. Medicine may be used to help control emotions, reduce reckless and self-destructive behavior, treat anxiety, and treat depression. °SEEK IMMEDIATE MEDICAL CARE IF:  °· You cannot control your behavior or emotions. °· You think about hurting yourself. °· You think about suicide. °FOR MORE INFORMATION °National Alliance on Mental Illness: www.nami.org °U.S. National Institute of Mental  Health: www.nimh.nih.gov °Borderline Personality Resource Center: http://bpdresourcecenter.org °Document Released: 07/15/2010 Document Revised: 06/22/2011 Document Reviewed: 07/15/2010 °ExitCare® Patient Information ©2015 [employer]. This information is not intended to replace advice given to you by your health   care provider. Make sure you discuss any questions you have with your health care provider. ° °
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
## 2013-12-29 NOTE — [facility] Suicide Risk Assessment (Addendum)
Suicide Risk Assessment  Discharge Assessment     Demographic Factors:  Caucasian  Total Time spent with patient: 20 minutes  Psychiatric Specialty Exam:     Blood pressure 112/68, pulse 62, temperature 97.8 F (36.6 C), temperature source Oral, resp. rate 12, last menstrual period 11/21/2012, SpO2 93.00%.There is no weight on file to calculate BMI.  General Appearance: Casual  Eye Contact::  Good  Speech:  Normal Rate  Volume:  Increased  Mood:  Angry and Irritable  Affect:  Congruent  Thought Process:  Coherent  Orientation:  Full (Time, Place, and Person)  Thought Content:  WDL  Suicidal Thoughts:  No  Homicidal Thoughts:  No  Memory:  Immediate;   Good Recent;   Good Remote;   Good  Judgement:  Fair  Insight:  Lacking  Psychomotor Activity:  Normal  Concentration:  Good  Recall:  Good  Fund of Knowledge:Good  Language: Good  Akathisia:  No  Handed:  Right  AIMS (if indicated):     Assets:  Intimacy Leisure Time Physical Health Resilience  Sleep:       Musculoskeletal: Strength & Muscle Tone: within normal limits Gait & Station: normal Patient leans: N/A   Mental Status Per Nursing Assessment::   On Admission:   agitation  Current Mental Status by Physician: NA  Loss Factors: NA  Historical Factors: NA  Risk Reduction Factors:   Sense of responsibility to family and Living with another person, especially a relative  Continued Clinical Symptoms:  Anxiety,irritation, depression  Cognitive Features That Contribute To Risk:  None  Suicide Risk:  Minimal: No identifiable suicidal ideation.  Patients presenting with no risk factors but with morbid ruminations; may be classified as minimal risk based on the severity of the depressive symptoms  Discharge Diagnoses:   AXIS I:  Bipolar disorder AXIS II:  Cluster B Traits AXIS III:   Past Medical History  Diagnosis Date  . Bipolar 1 disorder   . Obesity   . Diabetes mellitus   .  Hypertension   . Vomiting   . Substance abuse     alcohol   . Type II or unspecified type diabetes mellitus without mention of complication, not stated as uncontrolled   . Depression   . Hyperlipidemia   . Anxiety   . Suicide attempt     pt reports multiple suicide attempts.   AXIS IV:  other psychosocial or environmental problems, problems related to legal system/crime, problems related to social environment and problems with primary support group AXIS V:  61-70 mild symptoms  Plan Of Care/Follow-up recommendations:  Activity:  as tolerated Diet:  low-sodium heart healthy diet  Is patient on multiple antipsychotic therapies at discharge:  No   Has Patient had three or more failed trials of antipsychotic monotherapy by history:  No  Recommended Plan for Multiple Antipsychotic Therapies: NA    Nyeshia Mysliwiec, PMH-NP 12/29/2013, 3:02 PM

## 2013-12-29 NOTE — ED Notes (Signed)
Pharmacy in to talk with patient
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
## 2013-12-29 NOTE — ED Notes (Signed)
Bed: WA19 Expected date:  Expected time:  Means of arrival:  Comments: [employer]  

## 2013-12-29 NOTE — ED Notes (Signed)
Patient noncompliant with triage process-refusing to give name for ID/bracelet verification-states she wants long term help/treatment-screaming at writer/RN, verbally abusive to staff-called MD an "asshole"-unable to redirect at this time

## 2014-01-02 NOTE — Consult Note (Signed)
Case discussed, and agree with plan 

## 2014-01-10 ENCOUNTER — Other Ambulatory Visit: Payer: Self-pay | Admitting: Physician Assistant

## 2014-01-10 ENCOUNTER — Other Ambulatory Visit: Payer: Self-pay | Admitting: Family Medicine

## 2014-01-22 ENCOUNTER — Encounter (HOSPITAL_COMMUNITY): Payer: Self-pay | Admitting: Emergency Medicine

## 2014-01-22 ENCOUNTER — Emergency Department (HOSPITAL_COMMUNITY)
Admission: EM | Admit: 2014-01-22 | Discharge: 2014-01-23 | Disposition: A | Discharge status: Home / Self Care | Payer: Medicare Other | Attending: Emergency Medicine | Admitting: Emergency Medicine

## 2014-01-22 DIAGNOSIS — F419 Anxiety disorder, unspecified: Secondary | ICD-10-CM | POA: Diagnosis not present

## 2014-01-22 DIAGNOSIS — F3132 Bipolar disorder, current episode depressed, moderate: Secondary | ICD-10-CM | POA: Insufficient documentation

## 2014-01-22 DIAGNOSIS — Z046 Encounter for general psychiatric examination, requested by authority: Secondary | ICD-10-CM | POA: Diagnosis present

## 2014-01-22 DIAGNOSIS — E785 Hyperlipidemia, unspecified: Secondary | ICD-10-CM | POA: Diagnosis not present

## 2014-01-22 DIAGNOSIS — E669 Obesity, unspecified: Secondary | ICD-10-CM | POA: Diagnosis not present

## 2014-01-22 DIAGNOSIS — Z87891 Personal history of nicotine dependence: Secondary | ICD-10-CM | POA: Diagnosis not present

## 2014-01-22 DIAGNOSIS — F1020 Alcohol dependence, uncomplicated: Secondary | ICD-10-CM | POA: Insufficient documentation

## 2014-01-22 DIAGNOSIS — Z79899 Other long term (current) drug therapy: Secondary | ICD-10-CM | POA: Insufficient documentation

## 2014-01-22 DIAGNOSIS — E119 Type 2 diabetes mellitus without complications: Secondary | ICD-10-CM | POA: Insufficient documentation

## 2014-01-22 DIAGNOSIS — I10 Essential (primary) hypertension: Secondary | ICD-10-CM | POA: Insufficient documentation

## 2014-01-22 DIAGNOSIS — F3130 Bipolar disorder, current episode depressed, mild or moderate severity, unspecified: Secondary | ICD-10-CM | POA: Diagnosis present

## 2014-01-22 LAB — COMPREHENSIVE METABOLIC PANEL
ALBUMIN: 3.4 g/dL — AB (ref 3.5–5.2)
ALK PHOS: 76 U/L (ref 39–117)
ALT: 12 U/L (ref 0–35)
AST: 22 U/L (ref 0–37)
Anion gap: 13 (ref 5–15)
BUN: 10 mg/dL (ref 6–23)
CALCIUM: 9.3 mg/dL (ref 8.4–10.5)
CO2: 26 mEq/L (ref 19–32)
Chloride: 98 mEq/L (ref 96–112)
Creatinine, Ser: 0.94 mg/dL (ref 0.50–1.10)
GFR calc non Af Amer: 68 mL/min — ABNORMAL LOW (ref >90)
GFR, EST AFRICAN AMERICAN: 79 mL/min — AB (ref >90)
GLUCOSE: 80 mg/dL (ref 70–99)
Potassium: 5.2 mEq/L (ref 3.7–5.3)
SODIUM: 137 meq/L (ref 137–147)
TOTAL PROTEIN: 7.1 g/dL (ref 6.0–8.3)
Total Bilirubin: 0.3 mg/dL (ref 0.3–1.2)

## 2014-01-22 LAB — CBC
HCT: 38.5 % (ref 36.0–46.0)
Hemoglobin: 12.5 g/dL (ref 12.0–15.0)
MCH: 26.9 pg (ref 26.0–34.0)
MCHC: 32.5 g/dL (ref 30.0–36.0)
MCV: 82.8 fL (ref 78.0–100.0)
Platelets: 204 10*3/uL (ref 150–400)
RBC: 4.65 MIL/uL (ref 3.87–5.11)
RDW: 16.3 % — ABNORMAL HIGH (ref 11.5–15.5)
WBC: 4.8 10*3/uL (ref 4.0–10.5)

## 2014-01-22 LAB — RAPID URINE DRUG SCREEN, HOSP PERFORMED
AMPHETAMINES: NOT DETECTED
Barbiturates: NOT DETECTED
Benzodiazepines: NOT DETECTED
Cocaine: NOT DETECTED
OPIATES: NOT DETECTED
TETRAHYDROCANNABINOL: NOT DETECTED

## 2014-01-22 LAB — ACETAMINOPHEN LEVEL: Acetaminophen (Tylenol), Serum: <15 ug/mL (ref 10–30)

## 2014-01-22 LAB — ETHANOL: Alcohol, Ethyl (B): 110 mg/dL — ABNORMAL HIGH (ref 0–11)

## 2014-01-22 LAB — SALICYLATE LEVEL

## 2014-01-22 LAB — CBG MONITORING, ED
GLUCOSE-CAPILLARY: 88 mg/dL (ref 70–99)
Glucose-Capillary: 91 mg/dL (ref 70–99)

## 2014-01-22 MED ORDER — HYDROXYZINE PAMOATE 25 MG PO CAPS
25.0000 mg | ORAL_CAPSULE | Freq: Three times a day (TID) | ORAL | Status: DC
  Filled 2014-01-22: qty 1

## 2014-01-22 MED ORDER — THIAMINE HCL 100 MG/ML IJ SOLN: 100.0000 mg | Freq: Every day | INTRAMUSCULAR | Status: DC

## 2014-01-22 MED ORDER — METOPROLOL TARTRATE 25 MG PO TABS
50.0000 mg | ORAL_TABLET | Freq: Two times a day (BID) | ORAL | Status: DC
  Administered 2014-01-22 – 2014-01-23 (×2): 50 mg via ORAL
  Filled 2014-01-22 (×2): qty 2

## 2014-01-22 MED ORDER — METFORMIN HCL 500 MG PO TABS
500.0000 mg | ORAL_TABLET | Freq: Two times a day (BID) | ORAL | Status: DC
  Administered 2014-01-22 – 2014-01-23 (×3): 500 mg via ORAL
  Filled 2014-01-22 (×4): qty 1

## 2014-01-22 MED ORDER — AMLODIPINE BESYLATE 10 MG PO TABS
10.0000 mg | ORAL_TABLET | Freq: Every day | ORAL | Status: DC
  Administered 2014-01-22 – 2014-01-23 (×2): 10 mg via ORAL
  Filled 2014-01-22 (×2): qty 1

## 2014-01-22 MED ORDER — GABAPENTIN 300 MG PO CAPS
300.0000 mg | ORAL_CAPSULE | Freq: Three times a day (TID) | ORAL | Status: DC
  Administered 2014-01-22 – 2014-01-23 (×4): 300 mg via ORAL
  Filled 2014-01-22 (×4): qty 1

## 2014-01-22 MED ORDER — HYDROXYZINE HCL 25 MG PO TABS
25.0000 mg | ORAL_TABLET | Freq: Three times a day (TID) | ORAL | Status: DC
  Administered 2014-01-22 – 2014-01-23 (×3): 25 mg via ORAL
  Filled 2014-01-22 (×3): qty 1

## 2014-01-22 MED ORDER — VITAMIN B-1 100 MG PO TABS
100.0000 mg | ORAL_TABLET | Freq: Every day | ORAL | Status: DC
  Administered 2014-01-22 – 2014-01-23 (×2): 100 mg via ORAL
  Filled 2014-01-22 (×2): qty 1

## 2014-01-22 MED ORDER — LORAZEPAM 1 MG PO TABS: 0.0000 mg | ORAL_TABLET | Freq: Two times a day (BID) | ORAL | Status: DC

## 2014-01-22 MED ORDER — TRAZODONE HCL 100 MG PO TABS
100.0000 mg | ORAL_TABLET | Freq: Every day | ORAL | Status: DC
  Administered 2014-01-22: 100 mg via ORAL
  Filled 2014-01-22: qty 1

## 2014-01-22 MED ORDER — LORAZEPAM 1 MG PO TABS: 0.0000 mg | ORAL_TABLET | Freq: Four times a day (QID) | ORAL | Status: DC
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
## 2014-01-22 NOTE — ED Notes (Signed)
Pt expressed SI with a plan "Take a bunch of pills and alcohol" but did contract for safety while in the hospital.  No HI or visual hallucinations reported. Pt also reported hearing noises but stated that she did not hear specific words "just noises". Safety checks conducted every 15 minutes.

## 2014-01-22 NOTE — ED Notes (Signed)
Pt gave a urine sample, if needed.

## 2014-01-23 ENCOUNTER — Inpatient Hospital Stay (HOSPITAL_COMMUNITY): Admission: AD | Admit: 2014-01-23 | Payer: 59 | Source: Intra-hospital | Admitting: Psychiatry

## 2014-01-23 DIAGNOSIS — F3130 Bipolar disorder, current episode depressed, mild or moderate severity, unspecified: Secondary | ICD-10-CM

## 2014-01-23 DIAGNOSIS — F1099 Alcohol use, unspecified with unspecified alcohol-induced disorder: Secondary | ICD-10-CM

## 2014-01-23 LAB — CBG MONITORING, ED
GLUCOSE-CAPILLARY: 106 mg/dL — AB (ref 70–99)
Glucose-Capillary: 80 mg/dL (ref 70–99)

## 2014-01-23 NOTE — Progress Notes (Signed)
I spoke with Dr. Jannifer FranklinAkintayo by phone to make sure he was aware of the home medications that have not been addressed, Depakote, Seroquel, and Pravastatin. He stated he is aware and would not like to resume them at this time.   Charolotte Ekeom Nidia Grogan, PharmD, pager [phone]. 01/23/2014,11:29 AM.

## 2014-01-23 NOTE — ED Notes (Signed)
[facility] called, left message and awaiting return call

## 2014-01-23 NOTE — BH Assessment (Signed)
[facility] to [facility]. The accepting provider is Dr. Forrestine HimButtar. Nursing report # is [phone].
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
## 2014-01-23 NOTE — ED Notes (Signed)
Pt upset up at nurses station, demanding that staff go through her belongings to look for her phone charger, upon search in front of patient, no black rectangular cell phone charger was found in the patient's belongings, called triage and security to notify and see if a cell phone charger has been found, triage called back and stated that no cell phone charger with that description had been turned in, security to call back and let staff know if they find a charger matching the description of the patients.
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
## 2014-01-23 NOTE — ED Notes (Signed)
Attempted to call report to [facility], RN unable to take report at this time

## 2014-01-23 NOTE — ED Notes (Signed)
Secured the patients belongings back in the locker 34

## 2014-01-23 NOTE — ED Notes (Signed)
Pt flat/depressed during interaction explained to writer that she is upset because "my boyfriend said he is breaking up with me for good"--she also stated that he is not a positive support person for her and explained that "I sometimes go to doctors and tell them that I have things wrong with me so that I can get prescriptions for medicines and give them to him", pt states that she has a court date on Oct. 28th for "stealing baby clothes for [employer]".

## 2014-02-02 ENCOUNTER — Other Ambulatory Visit: Payer: Self-pay | Admitting: Family Medicine

## 2014-02-11 DEATH — deceased
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
## 2015-09-04 IMAGING — CR DG CHEST 1V PORT
1 series · 1 of 1 positions shown · non-contrast
Comparison: 05/02/2012

CLINICAL DATA: Hypoxia

EXAM:
PORTABLE CHEST - 1 VIEW

[AP]
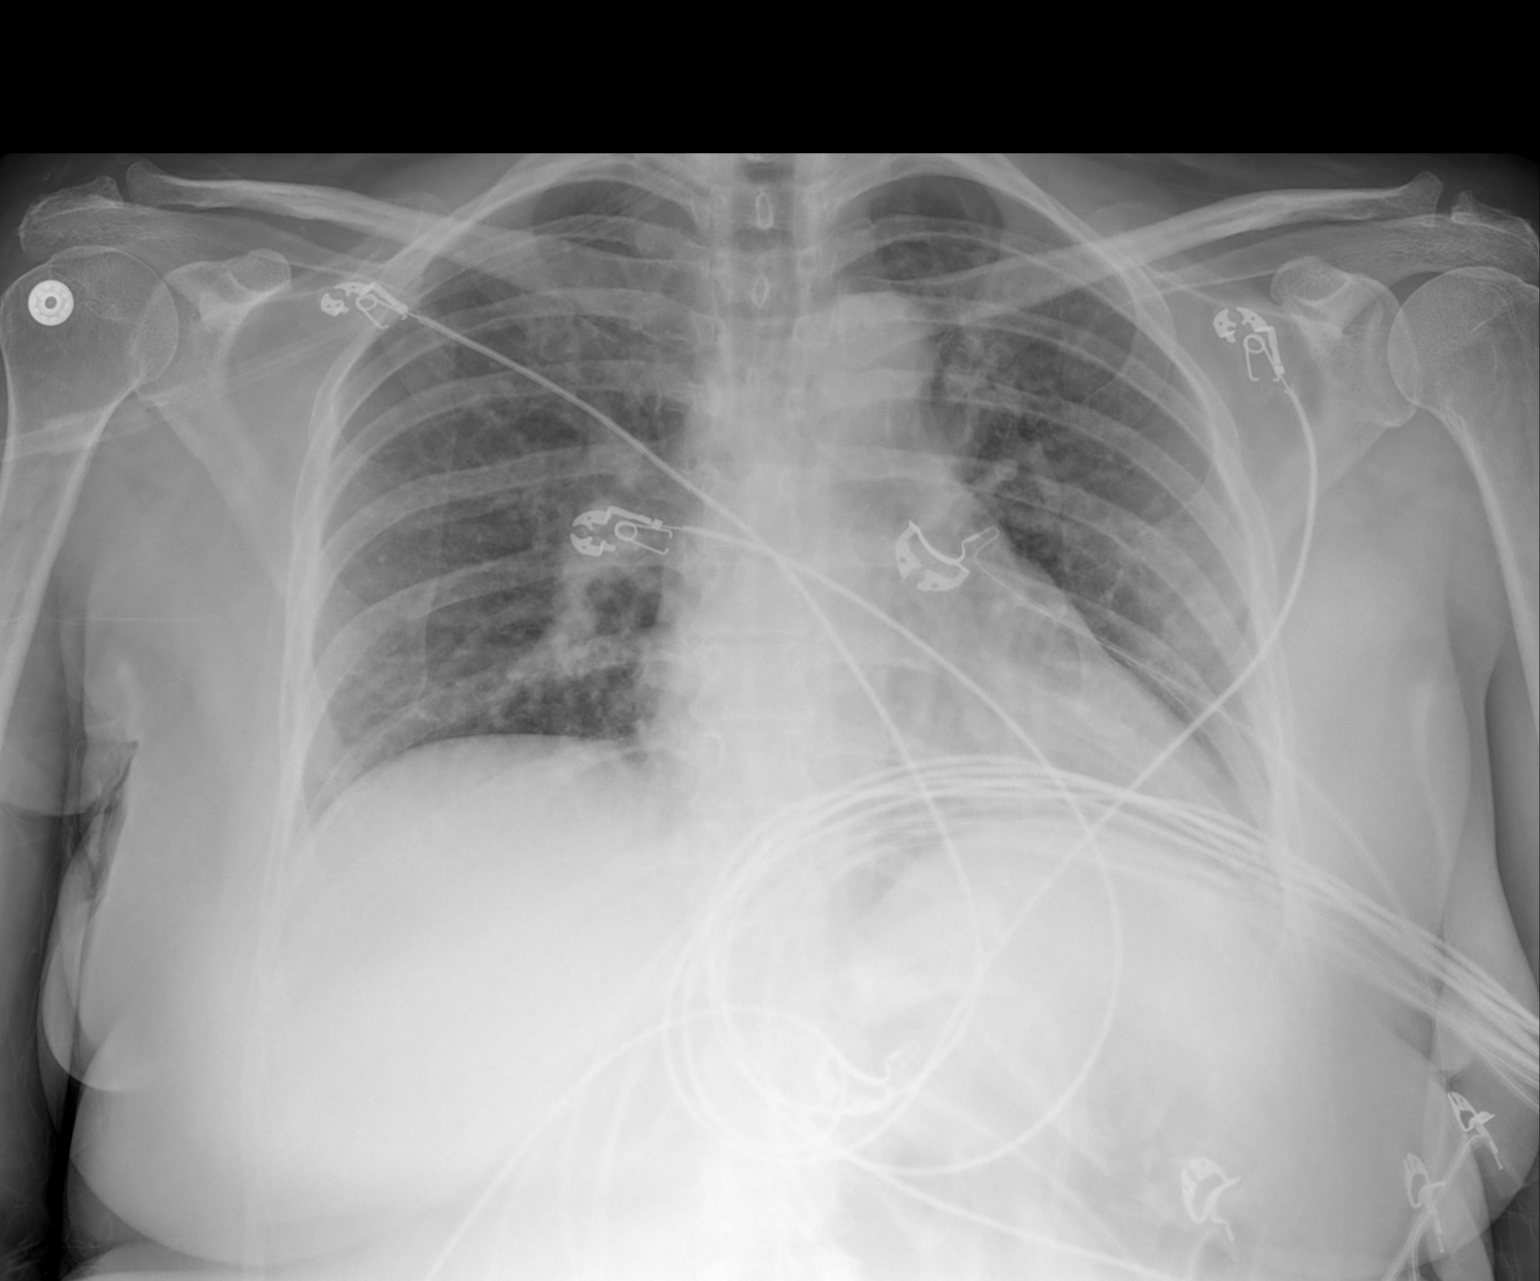

[1 of 1 positions shown; findings below may reference images not displayed]

FINDINGS: Cardiac shadow is within normal limits. The overall inspiratory
effort is poor with crowding of the vascular markings. No focal
confluent infiltrate is seen. No sizable effusion is noted. No bony
abnormality is seen.
IMPRESSION: Poor inspiratory effort with vascular crowding. No acute abnormality
is noted.

## 2017-01-11 ENCOUNTER — Encounter (HOSPITAL_COMMUNITY): Payer: Self-pay

## 2018-01-10 ENCOUNTER — Encounter (HOSPITAL_COMMUNITY): Payer: Self-pay
